# Patient Record
Sex: Male | Born: 1970 | Race: Black or African American | Hispanic: No | Marital: Single | Smoking: Former smoker
Health system: Southern US, Community
[De-identification: ages and names within clinical notes are randomized; demographics above are authoritative.]

## PROBLEM LIST (undated history)

## (undated) DIAGNOSIS — R1013 Epigastric pain: Secondary | ICD-10-CM

## (undated) DIAGNOSIS — M109 Gout, unspecified: Secondary | ICD-10-CM

## (undated) DIAGNOSIS — R51 Headache: Secondary | ICD-10-CM

## (undated) DIAGNOSIS — B019 Varicella without complication: Secondary | ICD-10-CM

## (undated) DIAGNOSIS — G43909 Migraine, unspecified, not intractable, without status migrainosus: Secondary | ICD-10-CM

## (undated) DIAGNOSIS — I10 Essential (primary) hypertension: Principal | ICD-10-CM

## (undated) HISTORY — DX: Migraine, unspecified, not intractable, without status migrainosus: G43.909

## (undated) HISTORY — DX: Essential (primary) hypertension: I10

## (undated) HISTORY — DX: Gout, unspecified: M10.9

## (undated) HISTORY — PX: WISDOM TOOTH EXTRACTION: SHX21

## (undated) HISTORY — DX: Varicella without complication: B01.9

## (undated) HISTORY — DX: Headache: R51

## (undated) SURGERY — COLONOSCOPY
Anesthesia: Moderate Sedation

---

## 2004-03-04 HISTORY — PX: HERNIA REPAIR: SHX51

## 2009-06-30 ENCOUNTER — Ambulatory Visit: Payer: Self-pay | Admitting: Internal Medicine

## 2009-06-30 ENCOUNTER — Ambulatory Visit (HOSPITAL_BASED_OUTPATIENT_CLINIC_OR_DEPARTMENT_OTHER): Admission: RE | Admit: 2009-06-30 | Discharge: 2009-06-30 | Payer: Self-pay | Admitting: Internal Medicine

## 2009-06-30 ENCOUNTER — Ambulatory Visit: Payer: Self-pay | Admitting: Diagnostic Radiology

## 2009-06-30 DIAGNOSIS — R079 Chest pain, unspecified: Secondary | ICD-10-CM

## 2009-06-30 DIAGNOSIS — R0602 Shortness of breath: Secondary | ICD-10-CM | POA: Insufficient documentation

## 2009-06-30 LAB — CONVERTED CEMR LAB
Albumin: 4.6 g/dL (ref 3.5–5.2)
BUN: 8 mg/dL (ref 6–23)
Basophils Relative: 0 % (ref 0–1)
CO2: 29 meq/L (ref 19–32)
Calcium: 9.4 mg/dL (ref 8.4–10.5)
Chloride: 101 meq/L (ref 96–112)
Cholesterol: 156 mg/dL (ref 0–200)
Creatinine, Ser: 1.12 mg/dL (ref 0.40–1.50)
Eosinophils Absolute: 0 10*3/uL (ref 0.0–0.7)
Glucose, Bld: 96 mg/dL (ref 70–99)
HCT: 43.3 % (ref 39.0–52.0)
HDL: 33 mg/dL — ABNORMAL LOW (ref >39)
Hemoglobin: 15.2 g/dL (ref 13.0–17.0)
Lymphocytes Relative: 40 % (ref 12–46)
Lymphs Abs: 2 10*3/uL (ref 0.7–4.0)
MCHC: 35.1 g/dL (ref 30.0–36.0)
Monocytes Relative: 5 % (ref 3–12)
Neutro Abs: 2.7 10*3/uL (ref 1.7–7.7)
Neutrophils Relative %: 54 % (ref 43–77)
Pro B Natriuretic peptide (BNP): 14 pg/mL (ref 0.0–100.0)
RDW: 13.5 % (ref 11.5–15.5)
TSH: 1.321 microintl units/mL (ref 0.350–4.500)
Total Bilirubin: 0.5 mg/dL (ref 0.3–1.2)
WBC: 4.9 10*3/uL (ref 4.0–10.5)

## 2009-07-02 ENCOUNTER — Telehealth: Payer: Self-pay | Admitting: Internal Medicine

## 2009-07-12 ENCOUNTER — Encounter (INDEPENDENT_AMBULATORY_CARE_PROVIDER_SITE_OTHER): Payer: Self-pay | Admitting: *Deleted

## 2009-07-13 ENCOUNTER — Telehealth: Payer: Self-pay | Admitting: Internal Medicine

## 2009-07-26 ENCOUNTER — Telehealth: Payer: Self-pay | Admitting: Internal Medicine

## 2010-04-03 NOTE — Assessment & Plan Note (Signed)
Summary: TO EST  Anthony Gill   Vital Signs:  Patient profile:   40 year old Gill Height:      67 inches Weight:      202.50 pounds BMI:     31.83 O2 Sat:      96 % on Room air Temp:     97.6 degrees F oral Pulse rate:   79 / minute BP sitting:   130 / 88  (right arm) Cuff size:   large  Vitals Entered By: Lucious Groves (June 30, 2009 9:42 AM)  O2 Flow:  Room air CC: CPX--NP--c/o breathing problems, SOB qhs that is affecting his sleep./kb Is Patient Diabetic? No Pain Assessment Patient in pain? no      Comments Patient would also like diabetes testing./kb   Primary Care Provider:  Dondra Spry DO  CC:  CPX--NP--c/o breathing problems and SOB qhs that is affecting his sleep./kb.  History of Present Illness: Anthony Gill to establish.   he c/o shortness of breath onset 3 weeks ago symptoms last approx 15 mins occ happens at day but mostly at night breathing heavy worse with exertion reports chest pains 4 to 5 times during same time period occ fluttering in the chest dry mouth at night no hx of lung dz  no hx CAD strong fam hx CAD - father (passed away age 75)  died of prostate   father had first MI at age 27   siblings have htn, and DM II also fam hx of asthma no personal hx of wheezing  Preventive Screening-Counseling & Management  Alcohol-Tobacco     Smoking Status: never      Drug Use:  no.    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  The patient denies fever, weight gain, peripheral edema, prolonged cough, abdominal pain, melena, hematochezia, severe indigestion/heartburn, and depression.    Physical Exam  General:  alert, well-developed, and well-nourished.   Head:  normocephalic and atraumatic.   Eyes:  pupils equal, pupils round, and pupils reactive to light.   Ears:  R ear normal and L ear normal.   Mouth:  pharynx pink and moist.   Neck:  No deformities, masses, or tenderness noted.no carotid bruits and no cervical lymphadenopathy.   Lungs:  normal respiratory effort, normal breath sounds, no crackles, and no wheezes.   Heart:  normal rate, regular rhythm, no murmur, and no gallop.   Abdomen:  soft, non-tender, normal bowel sounds, no masses, no hepatomegaly, and no splenomegaly.   Extremities:  trace left pedal edema and trace right pedal edema.   Neurologic:  cranial nerves II-XII intact and gait normal.   Psych:  normally interactive, good eye contact, not anxious appearing, and not depressed appearing.     Impression & Recommendations:  Problem # 1:  SHORTNESS OF BREATH (ICD-786.05) 40 y/o AA Gill with shortness of breath.  strong fam hx of CAD.  EKG normal.  CXR normal.  consider cardiomyopathy.  no sign of symptom of pulmonary dz.  arrange 2D Echo.  Check D dimer If symptoms worsen, pt will seek emergency medical care.  Orders: T-Basic Metabolic Panel [phone]) T-CBC w/Diff [phone]) T-TSH [phone]) T-BNP  (B Natriuretic Peptide) [phone]) T-Hepatic Function [id number]) T- * Misc. Laboratory test [phone]) T-2 View CXR, Same Day (71020.5TC) Echo Referral (Echo)  Problem # 2:  CHEST PAIN (ICD-786.50) Arrange further cardiac testing.    Orders: T-2 View CXR, Same Day (71020.5TC) Echo Referral (Echo) Cardiology Referral (Cardiology)  Other Orders: T-Lipid Profile ([id number])  Patient Instructions: 1)  Please schedule a follow-up appointment in 1  month. 2)  If your shortness of breath or chest pain gets worse, go to the emergency room for evaluation  Preventive Care Screening  Last Tetanus Booster:    Date:  03/05/2007    Results:  Historical    Prevention & Chronic Care Immunizations   Influenza vaccine: Not documented    Tetanus booster: 03/05/2007: Historical    Pneumococcal vaccine: Not documented  Other Screening   Smoking status: never  (06/30/2009)  Lipids   Total Cholesterol: Not documented   LDL: Not documented   LDL Direct: Not documented   HDL: Not documented   Triglycerides: Not documented

## 2010-04-03 NOTE — Progress Notes (Signed)
Summary: Lab results  Phone Note Outgoing Call   Summary of Call: call pt - electrolytes, kidney function, liver function tests normal.  no sign of anemia.  blood sugar normal.  I will discuss lab results in more detail at next OV Initial call taken by: D. Thomos Lemons DO,  Jul 02, 2009 10:59 AM  Follow-up for Phone Call        Patient and spouse notified. Follow-up by: Lucious Groves,  Jul 03, 2009 9:21 AM

## 2010-04-03 NOTE — Letter (Signed)
Summary: Primary Care Consult Scheduled Letter  [place] [facility]  [address]   [place], [place] [address]   Phone: [phone]  Fax: [phone]      07/12/2009 MRN: [id number]  EMIN FOREE [address] [place], [place]  [address]    Dear Mr. Ryner,      We have scheduled an appointment for you.  At the recommendation of DR Artist Pais , we have scheduled you a consult with DR Georges Lynch ,[place]  on MAY [phone] at _2PM .  Their address [phone] [facility] [address] ,[place] [address] . The office phone number is [phone].  If this appointment day and time is not convenient for you, please feel free to call the office of the doctor you are being referred to at the number listed above and reschedule the appointment.     It is important for you to keep your scheduled appointments. We are here to make sure you are given good patient care. If you have questions or you have made changes to your appointment, please notify us at  [phone], ask for HELEN.    Thank you,  Patient Care Coordinator [place] at [facility]

## 2010-04-03 NOTE — Progress Notes (Signed)
Summary: cardiology appts  Phone Note Call from Patient   Caller: Anthony Gill Details for Reason: Referrals Summary of Call: Pt had an Echo scheduled for  May 10th and did not show, his appt with Dr Jens Som is scheduled for  May 17th , you wanted him to have his Echo before seeing Cardilolgist,      I have left several messages for pt to return my call .  I sent Anthony Gill a letter with appt information with Dr Jens Som.  Initial call taken by: Darral Dash,  Jul 13, 2009 8:46 AM  Follow-up for Phone Call        please keep trying to contact pt Follow-up by: D. Thomos Lemons DO,  Jul 13, 2009 12:42 PM  Additional Follow-up for Phone Call Additional follow up Details #1::        Left message   for return call 07/13/2009, will keep trying to reach pt  . letter mailed with appt  information    spoke to pt , he does not understand the urgency of appointments  please call  [phone]  Additional Follow-up by: Darral Dash,  Jul 13, 2009 2:38 PM    Additional Follow-up for Phone Call Additional follow up Details #2::    Darlene,  please call pt and advise him to proceed with 2 D echo and cardiology appt as advised Follow-up by: D. Thomos Lemons DO,  Jul 17, 2009 1:20 PM  Additional Follow-up for Phone Call Additional follow up Details #3:: Details for Additional Follow-up Action Taken: Left message on machine for pt to return my call.  Mervin Kung CMA  Jul 17, 2009 2:38 PM   Pt. returned my call. Advised pt. of need to have echo and f/u with cardiologist to rule out heart condition as tests to date have been normal. Pt needs to keep appts. based on  symptoms he presented with at office visit and strong family history of heart disease. Pt. voices understanding and was transferred to [facility] to schedule appts.  Mervin Kung CMA  Jul 17, 2009 2:56 PM

## 2010-04-03 NOTE — Progress Notes (Signed)
Summary: appts set at Cardiology   Phone Note Outgoing Call   Call placed by: [facility] Cardiology Call placed to: Anthony Gill  Summary of Call: The patient has spoken with [facility] and is scheduled to have his echo on June 14th.  He is then scheduled with Dr Jens Som on June 15th.  This is what the patient himself set up with Cardiology.  If this is not soon enough please let me know and I will reschedule sooner.  Initial call taken by: Roselle Locus,  Jul 26, 2009 10:42 AM  Follow-up for Phone Call        this is ok Follow-up by: D. Thomos Lemons DO,  Jul 27, 2009 7:26 AM

## 2010-04-03 NOTE — Progress Notes (Signed)
Summary: Follow up Appt  Phone Note Call from Patient Call back at Home Phone [phone]   Caller: Patient Call For: D. Thomos Lemons DO Summary of Call: patient states he was seen last month, was informed that he was he was good, he states he [facility]ived a [facility] Referral. 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 He states whn he left the office visit he was informed that he was okay, and wanted to find out if there was something found after he  left the office. When he was contacted about the referral, patient states he inquired and was not provided information on why he was to follow up. The call dropped, and patient was called back. 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 Thomos Lemons DO,  Jul 26, 2009 10:37 AM

## 2011-06-24 ENCOUNTER — Ambulatory Visit (INDEPENDENT_AMBULATORY_CARE_PROVIDER_SITE_OTHER): Payer: BC Managed Care – PPO | Admitting: Internal Medicine

## 2011-06-24 ENCOUNTER — Encounter: Payer: Self-pay | Admitting: Internal Medicine

## 2011-06-24 VITALS — BP 112/80 | HR 85 | Temp 98.3°F | Ht 67.0 in | Wt 199.0 lb

## 2011-06-24 DIAGNOSIS — E781 Pure hyperglyceridemia: Secondary | ICD-10-CM

## 2011-06-24 DIAGNOSIS — M25429 Effusion, unspecified elbow: Secondary | ICD-10-CM

## 2011-06-24 MED ORDER — CEPHALEXIN 500 MG PO CAPS: 500.0000 mg | ORAL_CAPSULE | Freq: Three times a day (TID) | ORAL | Status: AC

## 2011-06-24 NOTE — Progress Notes (Signed)
  Subjective:    Patient ID: Anthony Gill, male    DOB: 03/28/1970, 41 y.o.   MRN: [id number]  HPI Pt presents to clinic for evaluation of insect sting. Notes visualized insect sting ?bee/wasp 6 days ago. Initially no significant sx's then over the next several days developed redness, warmth and swelling at the left elbow site of sting. No dyspnea, tongue swelling or difficulty swallowing. Taking no medication for the problem. Reviewed pictures of elbow from yesterday showing moderate st swelling. No fever, chills or drainage from elbow. Definite improvement today with decrease of swelling. No other alleviating or exacerbating factors. No other complaints. Reviewed past elevated TG level 2011. Taking no medication for cholesterol.   Past Medical History  Diagnosis Date  . History of chest pain   . History of shortness of breath    Past Surgical History  Procedure Date  . Hernia repair 2006    reports that he has never smoked. He has never used smokeless tobacco. He reports that he does not drink alcohol or use illicit drugs. family history includes Diabetes in an unspecified family member; Hypertension in an unspecified family member; and Prostate cancer in an unspecified family member. Not on File   Review of Systems see hpi    Objective:   Physical Exam  Nursing note and vitals reviewed. Constitutional: He appears well-developed and well-nourished. No distress.  HENT:  Head: Normocephalic and atraumatic.  Musculoskeletal:       Left elbow with possible small fluid collection at bursa. NT. +warmth and redness. No drainage. FROM.   Neurological: He is alert.  Skin: Skin is warm and dry. No rash noted. He is not diaphoretic. There is erythema. No pallor.  Psychiatric: He has a normal mood and affect.          Assessment & Plan:

## 2011-06-24 NOTE — Assessment & Plan Note (Signed)
Consider both inflammatory and infectious etiology. Given significant spontaneous improvement over last 24 hours more support for inflammatory. Begin aleve bid with food and no other nsaids. Given abx keflex to hold. If does not improve further over next 24 hours or worsens then begin abx immediately. Followup if no improvement or worsening.

## 2011-06-24 NOTE — Assessment & Plan Note (Signed)
Discussed follow up and recheck of fasting lipid profile. Will call to schedule when ready.

## 2011-08-30 ENCOUNTER — Ambulatory Visit (INDEPENDENT_AMBULATORY_CARE_PROVIDER_SITE_OTHER): Payer: BC Managed Care – PPO | Admitting: Internal Medicine

## 2011-08-30 ENCOUNTER — Encounter: Payer: Self-pay | Admitting: Internal Medicine

## 2011-08-30 ENCOUNTER — Ambulatory Visit (HOSPITAL_BASED_OUTPATIENT_CLINIC_OR_DEPARTMENT_OTHER)
Admission: RE | Admit: 2011-08-30 | Discharge: 2011-08-30 | Disposition: A | Discharge status: Home / Self Care | Payer: BC Managed Care – PPO | Source: Ambulatory Visit | Attending: Internal Medicine | Admitting: Internal Medicine

## 2011-08-30 VITALS — BP 110/80 | HR 83 | Temp 98.2°F | Resp 18

## 2011-08-30 DIAGNOSIS — M79609 Pain in unspecified limb: Secondary | ICD-10-CM

## 2011-08-30 DIAGNOSIS — M79673 Pain in unspecified foot: Secondary | ICD-10-CM

## 2011-08-30 LAB — URIC ACID: Uric Acid, Serum: 10.2 mg/dL — ABNORMAL HIGH (ref 4.0–7.8)

## 2011-08-30 MED ORDER — OXYCODONE-ACETAMINOPHEN 5-325 MG PO TABS: 1.0000 | ORAL_TABLET | Freq: Three times a day (TID) | ORAL | Status: DC | PRN

## 2011-08-30 MED ORDER — METHYLPREDNISOLONE 4 MG PO KIT: PACK | ORAL | Status: AC

## 2011-08-31 DIAGNOSIS — M79673 Pain in unspecified foot: Secondary | ICD-10-CM | POA: Insufficient documentation

## 2011-08-31 LAB — RHEUMATOID FACTOR: Rhuematoid fact SerPl-aCnc: <10 IU/mL (ref <=14)

## 2011-08-31 NOTE — Assessment & Plan Note (Signed)
Obtain plain radiograph of foot. Begin medrol dosepak. Percocet prn pain short term. Obtain ESR, uric acid and RF. Followup if no improvement or worsening.

## 2011-08-31 NOTE — Progress Notes (Signed)
  Subjective:    Patient ID: Anthony Gill, male    DOB: 1970-10-26, 41 y.o.   MRN: [id number]  HPI Pt presents to clinic for evaluation of foot pain. Notes 1-2 d h/o left foot pain located lateral plantar area with tenderness. Pain is worse on wt bearing. No injury/trauma. Has mild associated numbness. Denies redness, warmth or swelling. Attempted alcohol to the area without improvement. No other alleviating or exacerbating factors.  Past Medical History  Diagnosis Date  . History of chest pain   . History of shortness of breath    Past Surgical History  Procedure Date  . Hernia repair 2006    reports that he has quit smoking. He has never used smokeless tobacco. He reports that he does not drink alcohol or use illicit drugs. family history includes Diabetes in his mother; Heart disease in his paternal uncle; Hypertension in an unspecified family member; and Prostate cancer in an unspecified family member. No Known Allergies   Review of Systems see hpi     Objective:   Physical Exam  Nursing note and vitals reviewed. Constitutional: He appears well-developed and well-nourished. No distress.  HENT:  Head: Normocephalic and atraumatic.  Musculoskeletal:       Left foot- no erythema, warmth or effusion. +tenderness along plantar 5th MT head. No swelling  Neurological: He is alert.  Skin: Skin is warm and dry. No rash noted. He is not diaphoretic. No erythema.  Psychiatric: He has a normal mood and affect.          Assessment & Plan:

## 2011-09-02 ENCOUNTER — Telehealth: Payer: Self-pay | Admitting: *Deleted

## 2011-09-02 MED ORDER — COLCHICINE 0.6 MG PO TABS: 0.6000 mg | ORAL_TABLET | Freq: Two times a day (BID) | ORAL | Status: DC | PRN

## 2011-09-02 NOTE — Telephone Encounter (Signed)
Patient informed work note ready for P/U Mon-Fri 8a-5p; Rx sent to pharmacy/SLS

## 2011-09-02 NOTE — Telephone Encounter (Signed)
Notes Recorded by Edwyna Perfect, MD on 09/01/2011 at 7:51 PM Uric acid is high -strongly suggestive of gout. Steroids he is taking can help. Recommend f/u in 1-2 wks if doesn't already have appt  Spoke w/patient & patient's wife; scheduled follow-up OV for Tues, 07.09.13 @ 1:15p per wife's request. Patient [wife] requesting work note to cover period of 06.28.13 [OV] until next scheduled appointment [07.09.13]; patient's wife also faxing over [facility] paperwork for herself [caregiver] to be completed/SLS Please advise on work note dates.

## 2011-09-02 NOTE — Telephone Encounter (Signed)
If medrol doesn't improve foot pain then begin colcrys bid prn #20

## 2011-09-02 NOTE — Telephone Encounter (Signed)
Work Public house manager for MD signature/SLS

## 2011-09-02 NOTE — Telephone Encounter (Signed)
Ok for dates. Has/had severe foot pain limiting ambulation

## 2011-09-03 NOTE — Telephone Encounter (Signed)
Pt's wife stopped by the office for clarification of questions. She wanted to know if pt should take prednisone and colcrys together. Advised her per Dr Rodena Medin that pt should complete Prednisone and it is ok to take both meds together. Pt should take Colcrys twice a day as needed for foot pain and can stop taking it once pain is resolved.

## 2011-09-04 ENCOUNTER — Telehealth: Payer: Self-pay | Admitting: *Deleted

## 2011-09-04 NOTE — Telephone Encounter (Signed)
Ok to take abx. Is the foot any better yet?

## 2011-09-04 NOTE — Telephone Encounter (Signed)
Received call from pt's wife stating pt has an infected tooth and the dentist wants to start pt on an antibiotic, possibly amoxicillin. Pt's wife wants to make sure it will not interfere with any medication he is currently taking.  Please advise.

## 2011-09-04 NOTE — Telephone Encounter (Signed)
I spoke with pt's wife and gave below message, also she said the pain in foot is better.

## 2011-09-10 ENCOUNTER — Ambulatory Visit (INDEPENDENT_AMBULATORY_CARE_PROVIDER_SITE_OTHER): Payer: BC Managed Care – PPO | Admitting: Internal Medicine

## 2011-09-10 VITALS — BP 122/86 | HR 80 | Wt 199.0 lb

## 2011-09-10 DIAGNOSIS — M109 Gout, unspecified: Secondary | ICD-10-CM

## 2011-09-10 MED ORDER — OXYCODONE-ACETAMINOPHEN 5-325 MG PO TABS: 1.0000 | ORAL_TABLET | Freq: Three times a day (TID) | ORAL | Status: DC | PRN

## 2011-09-10 MED ORDER — ALLOPURINOL 300 MG PO TABS: 300.0000 mg | ORAL_TABLET | Freq: Every day | ORAL | Status: DC

## 2011-09-10 MED ORDER — METHYLPREDNISOLONE ACETATE 40 MG/ML IJ SUSP
40.0000 mg | Freq: Once | INTRAMUSCULAR | Status: AC
  Administered 2011-09-10: 40 mg via INTRAMUSCULAR

## 2011-09-10 NOTE — Progress Notes (Signed)
  Subjective:    Patient ID: Anthony Gill, male    DOB: [birth year], 41 y.o.   MRN: [id number]  HPI Pt presents to clinic for evaluation of foot pain. Reviewed elevated uric acid level. Improved with medrol dosepak. Able to ambulate now. Took only two dose of colchicine. Thinks eating peanuts made pain worse. No inflammatory changes. No prior h/o gout. No other alleviating or exacerbating factors.  Past Medical History  Diagnosis Date  . History of chest pain   . History of shortness of breath    Past Surgical History  Procedure Date  . Hernia repair 2006    reports that he has quit smoking. He has never used smokeless tobacco. He reports that he does not drink alcohol or use illicit drugs. family history includes Diabetes in his mother; Heart disease in his paternal uncle; Hypertension in an unspecified family member; and Prostate cancer in an unspecified family member. No Known Allergies   Review of Systems see hpi     Objective:   Physical Exam  Nursing note and vitals reviewed. Constitutional: He appears well-developed and well-nourished. No distress.  HENT:  Head: Normocephalic and atraumatic.  Musculoskeletal:       Left foot NT. No erythema, warm, effusion or bony abn. Gait nl  Neurological: He is alert.  Skin: Skin is warm and dry. No rash noted. He is not diaphoretic. No erythema.  Psychiatric: He has a normal mood and affect.          Assessment & Plan:

## 2011-09-15 ENCOUNTER — Encounter: Payer: Self-pay | Admitting: Internal Medicine

## 2011-09-15 DIAGNOSIS — M109 Gout, unspecified: Secondary | ICD-10-CM | POA: Insufficient documentation

## 2011-09-15 NOTE — Assessment & Plan Note (Signed)
Sx's most suggestive of gout. Improving. Given depomedrol IM injxn. Take colchicine bid prn. Given gout diet list. Followup if no improvement or worsening.

## 2011-10-08 ENCOUNTER — Ambulatory Visit: Payer: BC Managed Care – PPO | Admitting: Internal Medicine

## 2012-11-05 ENCOUNTER — Encounter (HOSPITAL_COMMUNITY): Payer: Self-pay | Admitting: Emergency Medicine

## 2012-11-05 ENCOUNTER — Emergency Department (HOSPITAL_COMMUNITY): Payer: BC Managed Care – PPO

## 2012-11-05 ENCOUNTER — Emergency Department (HOSPITAL_COMMUNITY)
Admission: EM | Admit: 2012-11-05 | Discharge: 2012-11-05 | Disposition: A | Discharge status: Home / Self Care | Payer: BC Managed Care – PPO | Attending: Emergency Medicine | Admitting: Emergency Medicine

## 2012-11-05 ENCOUNTER — Other Ambulatory Visit: Payer: Self-pay

## 2012-11-05 DIAGNOSIS — R141 Gas pain: Secondary | ICD-10-CM | POA: Insufficient documentation

## 2012-11-05 DIAGNOSIS — R109 Unspecified abdominal pain: Secondary | ICD-10-CM | POA: Insufficient documentation

## 2012-11-05 DIAGNOSIS — R112 Nausea with vomiting, unspecified: Secondary | ICD-10-CM | POA: Insufficient documentation

## 2012-11-05 DIAGNOSIS — R142 Eructation: Secondary | ICD-10-CM | POA: Insufficient documentation

## 2012-11-05 DIAGNOSIS — R42 Dizziness and giddiness: Secondary | ICD-10-CM | POA: Insufficient documentation

## 2012-11-05 DIAGNOSIS — Z87891 Personal history of nicotine dependence: Secondary | ICD-10-CM | POA: Insufficient documentation

## 2012-11-05 DIAGNOSIS — R1010 Upper abdominal pain, unspecified: Secondary | ICD-10-CM

## 2012-11-05 DIAGNOSIS — Z87898 Personal history of other specified conditions: Secondary | ICD-10-CM | POA: Insufficient documentation

## 2012-11-05 DIAGNOSIS — R6883 Chills (without fever): Secondary | ICD-10-CM | POA: Insufficient documentation

## 2012-11-05 LAB — URINALYSIS, ROUTINE W REFLEX MICROSCOPIC
Glucose, UA: NEGATIVE mg/dL
Ketones, ur: NEGATIVE mg/dL
pH: 7.5 (ref 5.0–8.0)

## 2012-11-05 LAB — BASIC METABOLIC PANEL
BUN: 10 mg/dL (ref 6–23)
CO2: 29 mEq/L (ref 19–32)
Calcium: 9.8 mg/dL (ref 8.4–10.5)
Chloride: 103 mEq/L (ref 96–112)
GFR calc Af Amer: >90 mL/min (ref >90)

## 2012-11-05 LAB — HEPATIC FUNCTION PANEL
Albumin: 4.2 g/dL (ref 3.5–5.2)
Total Bilirubin: 0.3 mg/dL (ref 0.3–1.2)
Total Protein: 7.7 g/dL (ref 6.0–8.3)

## 2012-11-05 LAB — CBC
MCH: 30.5 pg (ref 26.0–34.0)
MCHC: 36.7 g/dL — ABNORMAL HIGH (ref 30.0–36.0)
Platelets: 196 10*3/uL (ref 150–400)
RBC: 4.89 MIL/uL (ref 4.22–5.81)
WBC: 5.7 10*3/uL (ref 4.0–10.5)

## 2012-11-05 LAB — POCT I-STAT TROPONIN I: Troponin i, poc: 0.01 ng/mL (ref 0.00–0.08)

## 2012-11-05 MED ORDER — DICYCLOMINE HCL 20 MG PO TABS: 20.0000 mg | ORAL_TABLET | Freq: Four times a day (QID) | ORAL | Status: DC | PRN

## 2012-11-05 MED ORDER — ONDANSETRON HCL 4 MG/2ML IJ SOLN
4.0000 mg | Freq: Once | INTRAMUSCULAR | Status: AC
  Administered 2012-11-05: 4 mg via INTRAVENOUS
  Filled 2012-11-05: qty 2

## 2012-11-05 MED ORDER — MORPHINE SULFATE 4 MG/ML IJ SOLN
4.0000 mg | Freq: Once | INTRAMUSCULAR | Status: AC
  Administered 2012-11-05: 4 mg via INTRAVENOUS
  Filled 2012-11-05: qty 1

## 2012-11-05 MED ORDER — DICYCLOMINE HCL 10 MG/ML IM SOLN
20.0000 mg | Freq: Once | INTRAMUSCULAR | Status: AC
  Administered 2012-11-05: 20 mg via INTRAMUSCULAR
  Filled 2012-11-05: qty 2

## 2012-11-05 MED ORDER — GI COCKTAIL ~~LOC~~
30.0000 mL | Freq: Once | ORAL | Status: AC
  Administered 2012-11-05: 30 mL via ORAL
  Filled 2012-11-05: qty 30

## 2012-11-05 MED ORDER — SUCRALFATE 1 G PO TABS: 1.0000 g | ORAL_TABLET | Freq: Four times a day (QID) | ORAL | Status: DC

## 2012-11-05 MED ORDER — ONDANSETRON 4 MG PO TBDP: 4.0000 mg | ORAL_TABLET | Freq: Three times a day (TID) | ORAL | Status: DC | PRN

## 2012-11-05 MED ORDER — FAMOTIDINE IN NACL 20-0.9 MG/50ML-% IV SOLN
20.0000 mg | Freq: Once | INTRAVENOUS | Status: AC
  Administered 2012-11-05: 20 mg via INTRAVENOUS
  Filled 2012-11-05: qty 50

## 2012-11-05 MED ORDER — FAMOTIDINE 20 MG PO TABS: 20.0000 mg | ORAL_TABLET | Freq: Two times a day (BID) | ORAL | Status: DC

## 2012-11-05 MED ORDER — HYDROCODONE-ACETAMINOPHEN 5-325 MG PO TABS: 2.0000 | ORAL_TABLET | ORAL | Status: DC | PRN

## 2012-11-05 NOTE — ED Notes (Signed)
Patient transported to X-ray 

## 2012-11-05 NOTE — ED Notes (Signed)
Pt. woke up this morning with mid/left chest pain with  nausea and dizziness , denies SOB /cough or diaphoresis .

## 2012-11-05 NOTE — ED Notes (Signed)
The pt is asking for something else for pain.  Dr Norlene Campbell   Tied up with a critical pt info given to the pt and family

## 2012-11-05 NOTE — ED Provider Notes (Addendum)
CSN: [id number]     Arrival date & time 11/05/12  0507 History   First MD Initiated Contact with Patient 11/05/12 [phone]     Chief Complaint  Patient presents with  . Chest Pain   (Consider location/radiation/quality/duration/timing/severity/associated sxs/prior Treatment) HPI 42 yo male presents to the ER from home with complaint of epigastric pain radiating down and over the left side of his abdomen.  Pain is sharp, crampy.  He has had nausea and vomiting and dizziness since onset of pain.  Pain woke him from sleep around 2 am.  Pt reports similar episode on Sunday, 4 days ago that resolved on its own.  No unusual foods, no diarrhea, normal bm yesterday.  No fevers, but has had chills this morning.  No sob, diaphoresis, or chest pain.  Pain is worse when he is lying flat.  He denies prior pancreatitis, heavy alcohol use, smoking.  No prior colonoscopies.  Past Medical History  Diagnosis Date  . History of chest pain   . History of shortness of breath    Past Surgical History  Procedure Laterality Date  . Hernia repair  2006   Family History  Problem Relation Age of Onset  . Diabetes Mother   . Hypertension      family history  . Prostate cancer      family history  . Heart disease Paternal Uncle    History  Substance Use Topics  . Smoking status: Former Games developer  . Smokeless tobacco: Never Used  . Alcohol Use: No    Review of Systems  All other systems reviewed and are negative.    Allergies  Review of patient's allergies indicates no known allergies.  Home Medications  No current outpatient prescriptions on file. BP 147/37  Pulse 80  Temp(Src) 98 F (36.7 C) (Oral)  Resp 15  Ht 5\' 7"  (1.702 m)  Wt 190 lb (86.183 kg)  BMI 29.75 kg/m2  SpO2 99% Physical Exam  Nursing note and vitals reviewed. Constitutional: He is oriented to person, place, and time. He appears well-developed and well-nourished. He appears distressed.  HENT:  Head: Normocephalic and atraumatic.   Nose: Nose normal.  Mouth/Throat: Oropharynx is clear and moist.  Eyes: Conjunctivae and EOM are normal. Pupils are equal, round, and reactive to light.  Neck: Normal range of motion. Neck supple. No JVD present. No tracheal deviation present. No thyromegaly present.  Cardiovascular: Normal rate, regular rhythm, normal heart sounds and intact distal pulses.  Exam reveals no gallop and no friction rub.   No murmur heard. Pulmonary/Chest: Effort normal and breath sounds normal. No stridor. No respiratory distress. He has no wheezes. He has no rales. He exhibits no tenderness.  Abdominal: Soft. Bowel sounds are normal. He exhibits distension. He exhibits no mass. There is tenderness (ttp in epigastrium, left abdomen). There is no rebound and no guarding.  Musculoskeletal: Normal range of motion. He exhibits no edema and no tenderness.  Lymphadenopathy:    He has no cervical adenopathy.  Neurological: He is alert and oriented to person, place, and time. He exhibits normal muscle tone. Coordination normal.  Skin: Skin is warm and dry. No rash noted. No erythema. No pallor.  Psychiatric: He has a normal mood and affect. His behavior is normal. Judgment and thought content normal.    ED Course  Procedures (including critical care time) Labs Review Labs Reviewed  CBC - Abnormal; Notable for the following:    MCHC 36.7 (*)    All other components within  normal limits  BASIC METABOLIC PANEL - Abnormal; Notable for the following:    Glucose, Bld 158 (*)    GFR calc non Af Amer 89 (*)    All other components within normal limits  LIPASE, BLOOD  HEPATIC FUNCTION PANEL  URINALYSIS, ROUTINE W REFLEX MICROSCOPIC  POCT I-STAT TROPONIN I   Imaging Review Dg Chest 2 View  11/05/2012   *RADIOLOGY REPORT*  Clinical Data: Chest pain.  CHEST - 2 VIEW  Comparison: 06/30/2009.  Findings: No significant osseous abnormality.  Lungs are clear. No effusion or pneumothorax.  Cardiomediastinal size and contour  are within normal limits.  The upper abdomen is unremarkable.  IMPRESSION: No evidence of acute cardiopulmonary disease.   Original Report Authenticated By: Tiburcio Pea   Dg Abd 1 View  11/05/2012   *RADIOLOGY REPORT*  Clinical Data: Chest pain.  ABDOMEN - 1 VIEW  Comparison: None.  Findings: Nonobstructive bowel gas pattern.  No abnormal mass effect or calcification in the imaged abdomen.  No acute osseous abnormality.  Incidental right os acetabuli.  IMPRESSION: Nonobstructive bowel gas pattern.   Original Report Authenticated By: Tiburcio Pea    Date: 11/05/2012  Rate: 75  Rhythm: normal sinus rhythm  QRS Axis: normal  Intervals: normal  ST/T Wave abnormalities: normal  Conduction Disutrbances:none  Narrative Interpretation:   Old EKG Reviewed: unchanged    MDM   1. Upper abdominal pain     42 yo male with acute abd pain, n/v, recent h/o same.  Will get labs, give pain mediation, gi coctail.  Differential includes gastritis, pancreatitis, colitis.      Olivia Mackie, MD 11/05/12 1610  Olivia Mackie, MD 11/05/12 [phone]

## 2012-11-09 ENCOUNTER — Ambulatory Visit: Payer: BC Managed Care – PPO | Admitting: Physician Assistant

## 2013-03-04 DIAGNOSIS — R519 Headache, unspecified: Secondary | ICD-10-CM

## 2013-03-04 HISTORY — DX: Headache, unspecified: R51.9

## 2013-04-13 ENCOUNTER — Other Ambulatory Visit: Payer: Self-pay | Admitting: Internal Medicine

## 2013-04-13 NOTE — Telephone Encounter (Signed)
Pt has not been seen in our office since 09/2011.  Please advise re: below requests and follow up?  Medication name:  Name from pharmacy:  allopurinol (ZYLOPRIM) 300 MG tablet  ALLOPURINOL 300 MG TABLET 300 MG TAB The source prescription has been discontinued. Sig: TAKE 1 TABLET (300 MG TOTAL) BY MOUTH DAILY. Dispense: 30 tablet Refills: 6 Start: 04/13/2013 Class: Normal Requested on: 09/10/2011 Originally ordered on: 09/10/2011 Last refill: 05/22/2012 Order History and Details  Medication name:  Name from pharmacy:  COLCRYS 0.6 MG tablet  COLCRYS 0.6 MG TABLET 0.6 MG TAB The source prescription has been discontinued. Sig: TAKE 1 TABLET (0.6 MG TOTAL) BY MOUTH 2 (TWO) TIMES DAILY AS NEEDED. Dispense: 20 tablet Refills: 0 Start: 04/13/2013 Class: Normal Requested on: 09/02/2011 Originally ordered on: 09/02/2011 Last refill: 09/02/2011

## 2013-04-14 ENCOUNTER — Encounter: Payer: Self-pay | Admitting: Family Medicine

## 2013-04-14 ENCOUNTER — Ambulatory Visit (INDEPENDENT_AMBULATORY_CARE_PROVIDER_SITE_OTHER): Payer: BC Managed Care – PPO | Admitting: Family Medicine

## 2013-04-14 VITALS — BP 134/78 | HR 91 | Temp 98.8°F | Resp 14 | Ht 67.5 in | Wt 210.0 lb

## 2013-04-14 DIAGNOSIS — G43909 Migraine, unspecified, not intractable, without status migrainosus: Secondary | ICD-10-CM

## 2013-04-14 DIAGNOSIS — K219 Gastro-esophageal reflux disease without esophagitis: Secondary | ICD-10-CM

## 2013-04-14 DIAGNOSIS — M109 Gout, unspecified: Secondary | ICD-10-CM

## 2013-04-14 LAB — CBC WITH DIFFERENTIAL/PLATELET
BASOS PCT: 0.2 % (ref 0.0–3.0)
Basophils Absolute: 0 10*3/uL (ref 0.0–0.1)
EOS ABS: 0.1 10*3/uL (ref 0.0–0.7)
EOS PCT: 1.1 % (ref 0.0–5.0)
HCT: 43.3 % (ref 39.0–52.0)
Hemoglobin: 14.6 g/dL (ref 13.0–17.0)
LYMPHS PCT: 36.9 % (ref 12.0–46.0)
Lymphs Abs: 2.2 10*3/uL (ref 0.7–4.0)
MCHC: 33.7 g/dL (ref 30.0–36.0)
MCV: 89.4 fl (ref 78.0–100.0)
Monocytes Absolute: 0.4 10*3/uL (ref 0.1–1.0)
Monocytes Relative: 6.6 % (ref 3.0–12.0)
NEUTROS PCT: 55.2 % (ref 43.0–77.0)
Neutro Abs: 3.3 10*3/uL (ref 1.4–7.7)
Platelets: 195 10*3/uL (ref 150.0–400.0)
RBC: 4.85 Mil/uL (ref 4.22–5.81)
RDW: 13.7 % (ref 11.5–14.6)
WBC: 5.9 10*3/uL (ref 4.5–10.5)

## 2013-04-14 LAB — COMPREHENSIVE METABOLIC PANEL
ALBUMIN: 4.2 g/dL (ref 3.5–5.2)
ALT: 21 U/L (ref 0–53)
AST: 25 U/L (ref 0–37)
Alkaline Phosphatase: 65 U/L (ref 39–117)
BUN: 10 mg/dL (ref 6–23)
CALCIUM: 8.8 mg/dL (ref 8.4–10.5)
CHLORIDE: 104 meq/L (ref 96–112)
CO2: 24 mEq/L (ref 19–32)
Creatinine, Ser: 1 mg/dL (ref 0.4–1.5)
GFR: 107.31 mL/min (ref >60.00)
GLUCOSE: 112 mg/dL — AB (ref 70–99)
POTASSIUM: 3.6 meq/L (ref 3.5–5.1)
SODIUM: 139 meq/L (ref 135–145)
TOTAL PROTEIN: 7.5 g/dL (ref 6.0–8.3)
Total Bilirubin: 0.6 mg/dL (ref 0.3–1.2)

## 2013-04-14 LAB — URIC ACID: URIC ACID, SERUM: 7.1 mg/dL (ref 4.0–7.8)

## 2013-04-14 LAB — TSH: TSH: 0.55 u[IU]/mL (ref 0.35–5.50)

## 2013-04-14 MED ORDER — RIZATRIPTAN BENZOATE 10 MG PO TABS: 10.0000 mg | ORAL_TABLET | ORAL | Status: DC | PRN

## 2013-04-14 MED ORDER — PANTOPRAZOLE SODIUM 40 MG PO TBEC: 40.0000 mg | DELAYED_RELEASE_TABLET | Freq: Every day | ORAL | Status: DC

## 2013-04-14 NOTE — Progress Notes (Signed)
Subjective:     Patient ID: Anthony Gill, male   DOB: Mar 12, 1970, 43 y.o.   MRN: [id number]  Migraine  This is a new problem. The current episode started 1 to 4 weeks ago (Onset 1 month ago. Headaches occur 3-4 days per week, can be multiple times per day. Sometimes gets headaches in the middle of the night or first thing in the morning.). The problem occurs daily. The problem has been unchanged. The pain is located in the frontal region. The pain does not radiate. Similar to prior headaches: Pt denies having headaches in the past. He has never been evaluated for this problem before.  The quality of the pain is described as sharp (Pain is frontal and does not radiate. ). The pain is at a severity of 8/10. The pain is severe. Associated symptoms include a visual change (a bit of "twinkling lights in lateral visual fields). Pertinent negatives include no abdominal pain, anorexia, blurred vision, dizziness, eye pain, eye watering, insomnia, nausea, numbness, phonophobia, photophobia, rhinorrhea, vomiting or weakness. Associated symptoms comments: Pt denies fatigue in daytime. He is unsure about snoring at night, has been told by his wife that he breathes abnormally occasionally at bedtime. . The symptoms are aggravated by unknown (Does not drink coffee. Drinks high amounts of caffeinated sodas daily,. ). He has tried acetaminophen, darkened room and NSAIDs (Pt has tried multiple OTC meds including ibuprofen, aleve, motrin, exedrin migraine headache. No relief from above. Uses  BC powder 1-3x per day if headache persists. Sometimes his headaches return within an hour after taking BC powder. ) for the symptoms. His past medical history is significant for obesity.  Gastrophageal Reflux He complains of belching and heartburn. He reports no abdominal pain, no chest pain, no dysphagia or no nausea. burning epigastric pain. This is a new problem. The current episode started more than 1 month ago. The problem occurs  frequently. The problem has been unchanged. The heartburn is located in the substernum and LUQ. Exacerbated by: Pt denies known triggers. He reports he can eat a healthy meal , eat candy, or just drink water and he'll notice the burning.  Pertinent negatives include no fatigue. Risk factors include caffeine use, NSAIDs and obesity. He has tried an antacid ([facility] for "Chest pain" at  which point he was diagnosed with heartburn. He was given Pepcid and found relief.  He used Tums OTC  sometimes with or without relief. ) for the symptoms. No images/studies have been performed. .  Toe Pain  The incident occurred 12 to 24 hours ago (Pt was diagnosed with Gout in 2013 at which time he was prescribed Colchine and allopurinol.  Reports medication noncompliance.  He had 1 flare up about a month ago in the same toe. His episodes of gout flares in 2013 were also in the same location. ). The incident occurred at work. Pain location: right first toe. The pain is at a severity of 10/10 (Today is better than yesterday on pain scale, per pt. ). The pain has been improving since onset. Pertinent negatives include no numbness. Associated symptoms comments: Was unable to wear right shoe yesterday. . Treatments tried: Pt reports taking Colchicine yesterday x 3 and another unknown medicine x 3 for pain relief. that he has used in the past for gout.  The treatment provided significant relief.   Headache: Pt reports flashing lights in peripheral vision during his headache but not before his headache.   Review of  Systems  Constitutional: Negative for appetite change and fatigue.  HENT: Negative for rhinorrhea and trouble swallowing.   Eyes: Positive for visual disturbance. Negative for blurred vision, photophobia, pain and discharge.  Respiratory: Positive for shortness of breath.   Cardiovascular: Negative for chest pain.  Gastrointestinal: Positive for heartburn. Negative for dysphagia, nausea, vomiting,  abdominal pain and anorexia.  Musculoskeletal: Positive for arthralgias and joint swelling.  Neurological: Negative for dizziness, facial asymmetry, speech difficulty, weakness and numbness.  Psychiatric/Behavioral: The patient does not have insomnia.        Objective:   Physical Exam  Constitutional: He is oriented to person, place, and time. He appears well-developed and well-nourished. No distress.  HENT:  Head: Normocephalic and atraumatic.  Mouth/Throat: No posterior oropharyngeal edema or posterior oropharyngeal erythema.  Pasty white coating on superior surface of tongue that scrapes off with tongue depressor.   Eyes: Lids are normal. Right eye exhibits no discharge. Left eye exhibits no discharge.  Neck: Carotid bruit is not present.  Cardiovascular: Normal rate, regular rhythm and normal heart sounds.   Pulmonary/Chest: Effort normal and breath sounds normal. No respiratory distress.  Abdominal: Soft. Bowel sounds are normal. There is no tenderness.  Musculoskeletal:  1st phalanx of right foot is edematous and mildly warm to touch in comparison to left foot. Interphalangeal joint of 1st toe (right foot) is mildly tender to touch and tender with active ROM, both flxn and extn.   Lymphadenopathy:    He has no cervical adenopathy.  Neurological: He is alert and oriented to person, place, and time.  Psychiatric: He has a normal mood and affect.    Vitals: Wgt:  210 lbs             Temp: 98.8 (Oral)             O2: 94% Room Air Ht:     67.5"                 HR:    91 bpm                   BP:  134/78 mm Hg       Assessment:     1. Migraine HA syndrome most likely dx for his HAs. 2. GERD 3. Gout Flare    Plan:     Rx: 1. Rizatriptan (Maxalt) 10 mg PO when headache begins.  2. Pantoprazole 40 mg q morning before breakfast.  GERD diet handout reviewed/given to pt today. 3. Prednisone Taper, PO 40 mg daily x 3 days, 20 mg daily x 3 days, 10 mg daily x 2 days   Labs  today:  1. CBC 2. CMet 3. TSH 4. Uric Acid  Pt Education: Pt expressed understanding how to use his medicines. He was advised to refrain from using BC/Goodie powder for his headaches. He was educated about dietary modifications to avoid Gout flares in the future.  F/u: 3-4 weeks to re-evaluate Headaches, GERD, Gout Plan for future: Consider preventive treatment for Gout flares. Reduce frequency of Maxalt use if possible.    02.11.2015  [place] , PA-S.     Agree--PM

## 2013-04-15 ENCOUNTER — Encounter: Payer: Self-pay | Admitting: Family Medicine

## 2013-04-15 MED ORDER — PREDNISONE 20 MG PO TABS: ORAL_TABLET | ORAL | Status: DC

## 2013-04-15 NOTE — Telephone Encounter (Signed)
I gave him written rx's for prednisone, pantoprazole, and maxalt.  I bet I forgot to write the # to dispense on the maxalt. I'll send eRx's now so you can tell him to throw away the written rx's.  Tell him to FOLLOW THE INSTRUCTIONS ON THE BOTTLES AND THEN HE'LL KNOW EXACTLY HOW TO TAKE THEM AND WON'T HAVE TO TRY TO REMEMBER WHICH ONE TO TAKE EVERY DAY AND WHICH IS PRN, etc. -thx

## 2013-04-15 NOTE — Telephone Encounter (Signed)
Patient was not able to pick up the Rx for gout. The pharmacy did not have an Rx. He has a handwritten Rx but it doesn't have a quantity on it.  Patient is unsure which medication he is supposed to take every day & he is unsure which medications he can take as needed for his headaches. Patient states that the pharmacy said he couldn't take his medications every day. Please contact patient to clarify instructions & send gout medication to [facility] on [address].

## 2013-04-15 NOTE — Telephone Encounter (Signed)
Dr. Milinda CaveMcGowen saw him yesterday. I will forward to him.

## 2013-04-23 NOTE — Telephone Encounter (Signed)
Patient is having migraines 3 or 4 times a day. He is almost out Maxalt. Please send in refill for Maxalt. Please contact patient when Rx is sent in. Patient said if Rx can't be sent in can he take [facility]?

## 2013-04-26 ENCOUNTER — Telehealth: Payer: Self-pay | Admitting: Family Medicine

## 2013-04-26 DIAGNOSIS — R51 Headache: Principal | ICD-10-CM

## 2013-04-26 DIAGNOSIS — R519 Headache, unspecified: Secondary | ICD-10-CM

## 2013-04-26 NOTE — Telephone Encounter (Signed)
Patient also has an Rx response message asking if pt can take [facility] for headaches.  He is having headaches everyday.  Please advise.

## 2013-04-26 NOTE — Telephone Encounter (Signed)
Patient wants to know if he can take [facility] for his headaches. He has headaches every day. He is out of the Maxalt so he needs a refill.

## 2013-04-26 NOTE — Telephone Encounter (Signed)
Patient requesting maxalt refill.  Last rx was given 04/14/13 # 10.  Please advise refill.

## 2013-04-26 NOTE — Telephone Encounter (Signed)
Patient has taken all of the migraine medicine and is still having headaches, pt is requesting a refill, please advise

## 2013-04-27 MED ORDER — KETOPROFEN 75 MG PO CAPS: ORAL_CAPSULE | ORAL | Status: DC

## 2013-04-27 MED ORDER — RIZATRIPTAN BENZOATE 10 MG PO TABS: 10.0000 mg | ORAL_TABLET | ORAL | Status: DC | PRN

## 2013-04-27 MED ORDER — PROPRANOLOL HCL ER BEADS 80 MG PO CP24: ORAL_CAPSULE | ORAL | Status: DC

## 2013-04-27 NOTE — Telephone Encounter (Signed)
Talked to pt on phone. HA's still daily, maxalt not convincingly helpful going by how he describes things since I started him on this med about 2 wks ago. He has abstained from taking any OTC NSAIDs as per my request last visit. I recommended he see a neurologist and he was agreeable to this. In the meantime, I will continue maxalt but will send in rx for ketoprofen 75mg  to take as first line HA med prior to trying maxalt.  Also, will start nightly generic innopran xl 80mg  qhs.  Pt expressed understanding of plan.a

## 2013-04-28 ENCOUNTER — Encounter: Payer: Self-pay | Admitting: Neurology

## 2013-04-28 ENCOUNTER — Ambulatory Visit (INDEPENDENT_AMBULATORY_CARE_PROVIDER_SITE_OTHER): Payer: BC Managed Care – PPO | Admitting: Neurology

## 2013-04-28 VITALS — BP 140/86 | HR 80 | Temp 98.7°F | Resp 22 | Ht 67.0 in | Wt 206.4 lb

## 2013-04-28 DIAGNOSIS — R519 Headache, unspecified: Secondary | ICD-10-CM

## 2013-04-28 DIAGNOSIS — R51 Headache: Secondary | ICD-10-CM

## 2013-04-28 MED ORDER — AMITRIPTYLINE HCL 25 MG PO TABS: 25.0000 mg | ORAL_TABLET | Freq: Every day | ORAL | Status: DC

## 2013-04-28 NOTE — Patient Instructions (Addendum)
It sounds like you have chronic tension headaches. 1.  We will start amitriptyline 25mg  at bedtime.  It is an anitidepressant but commonly used for headache.  Side effects include sleepiness, dizziness, lower heart rate and lightheadedness. 2.  May take Aleve or Advil or BC powder, but no more than once or twice a week to prevent rebound headache.  Stop the rizatriptan. 3.  We will get MRI of the brain. We [facility] on 05/13/13 at 5:00 pm. Please arrive 15 minutes prior and go to 1st floor radiology. 4.  Call in 4 weeks with update.  Follow up in 3 months.

## 2013-04-28 NOTE — Progress Notes (Signed)
NEUROLOGY CONSULTATION NOTE  Anthony Gill MRN: [id number] DOB: 1971-02-05  Referring provider: Dr. Milinda Cave Primary care provider: Dr. Rodena Medin  Reason for consult:  Headache  HISTORY OF PRESENT ILLNESS: Anthony Gill is a 43 year old right-handed man with history of gout and GERD who presents for headache.  Records and images were personally reviewed where available.    Onset:  Since December 2014 Location:  Bi-frontal Quality:  Non-throbbing, clamping Intensity:  9/10 Aura:  no Associated symptoms:  Brief flickering light, but not usually.  No nausea, photophobia, phonophobia, osmophobia Duration:  Initially 1 hour but more constant lately Frequency:  Initially once a week and then almost every day since mid January Triggers/exacerbating factors:  none Relieving factors:  none  Past abortive therapy:  Aleve (ineffective), Motrin (ineffective), BC powder (was taking pain relievers daily up until 04/14/13) Past preventative therapy:  none  Current abortive therapy:  Maxalt 10mg  (ineffective), ketoprofen 75mg  (hasn't taken it) Current preventative therapy:  none  Caffeine:  soda Stress/depression:  no Sleep hygiene:  good History of migraine:  no Family history of migraine:  no.  PAST MEDICAL HISTORY: Past Medical History  Diagnosis Date  . Gout   . Migraines     PAST SURGICAL HISTORY: Past Surgical History  Procedure Laterality Date  . Hernia repair  2006    MEDICATIONS: Current Outpatient Prescriptions on File Prior to Visit  Medication Sig Dispense Refill  . allopurinol (ZYLOPRIM) 300 MG tablet TAKE 1 TABLET (300 MG TOTAL) BY MOUTH DAILY.  30 tablet  6  . COLCRYS 0.6 MG tablet TAKE 1 TABLET (0.6 MG TOTAL) BY MOUTH 2 (TWO) TIMES DAILY AS NEEDED.  20 tablet  0  . rizatriptan (MAXALT) 10 MG tablet Take 1 tablet (10 mg total) by mouth as needed for migraine. May repeat in 2 hours if needed  10 tablet  1  . propranolol (INNOPRAN XL) 80 MG 24 hr capsule 1 cap  po EVERY NIGHT at bedtime for prevention of migraine headaches  30 capsule  1   No current facility-administered medications on file prior to visit.    ALLERGIES: No Known Allergies  FAMILY HISTORY: Family History  Problem Relation Age of Onset  . Diabetes Mother   . Hypertension      family history  . Prostate cancer Father     family history  . Heart disease Paternal Uncle     SOCIAL HISTORY: History   Social History  . Marital Status: Married    Spouse Name: N/A    Number of Children: N/A  . Years of Education: N/A   Occupational History  . Not on file.   Social History Main Topics  . Smoking status: Former Smoker    Quit date: 04/04/2013  . Smokeless tobacco: Never Used  . Alcohol Use: No  . Drug Use: No  . Sexual Activity: Not on file   Other Topics Concern  . 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   Grew up in [place]   Married   1 son    REVIEW OF SYSTEMS: Constitutional: No fevers, chills, or sweats, no generalized fatigue, change in appetite Eyes: No visual changes, double vision, eye pain Ear, nose and throat: No hearing loss, ear pain, nasal congestion, sore throat Cardiovascular: No chest pain, palpitations Respiratory:  No shortness of breath at rest or with exertion, wheezes GastrointestinaI: No nausea, vomiting, diarrhea, abdominal pain, fecal  incontinence Genitourinary:  No dysuria, urinary retention or frequency Musculoskeletal:  No neck pain, back pain Integumentary: No rash, pruritus, skin lesions Neurological: as above Psychiatric: No depression, insomnia, anxiety Endocrine: No palpitations, fatigue, diaphoresis, mood swings, change in appetite, change in weight, increased thirst Hematologic/Lymphatic:  No anemia, purpura, petechiae. Allergic/Immunologic: no itchy/runny eyes, nasal congestion, recent allergic reactions, rashes  PHYSICAL EXAM: Filed Vitals:   04/28/13 1400  BP:  140/86  Pulse: 80  Temp: 98.7 F (37.1 C)  Resp: 22   General: No acute distress Head:  Normocephalic/atraumatic Neck: supple, no paraspinal tenderness, full range of motion Back: No paraspinal tenderness Heart: regular rate and rhythm Lungs: Clear to auscultation bilaterally. Vascular: No carotid bruits. Neurological Exam: Mental status: alert and oriented to person, place, and time, speech fluent and not dysarthric, language intact. Cranial nerves: CN I: not tested CN II: pupils equal, round and reactive to light, visual fields intact, fundi unremarkable. CN III, IV, VI:  full range of motion, no nystagmus, no ptosis CN V: facial sensation intact CN VII: upper and lower face symmetric CN VIII: hearing intact CN IX, X: gag intact, uvula midline CN XI: sternocleidomastoid and trapezius muscles intact CN XII: tongue midline Bulk & Tone: normal, no fasciculations. Motor: 5/5 throughout Sensation: temperature and vibration intact. Deep Tendon Reflexes: 2+ throughout, toes down Finger to nose testing: no dysmetria Heel to shin: no dysmetria Gait: normal stance and stride, able to turn, walk on toes, heels and in tandem. Romberg negative.  IMPRESSION: Chronic daily headaches, maybe complicated by medication-overuse.  Most likely tension.  PLAN: 1.  Will start amitriptyline 25mg  at bedtime.  Side effects discussed.  PCP had prescribed propranolol yesterday but patient hasn't started it yet.  We will try amitriptyline first, since I do not suspect migraine at this point. 2.  May take [facility] powder or NSAIDs but to limit to no more than once or twice a week. Stop Maxalt. 3.  Since this is a new severe constant headache, with no prior history of headache, we will get MRI of brain with and without. 4.  Call in 4 weeks with update.  Follow up in 3 months.  Thank you for allowing me to take part in the care of this patient.  Anthony MilletAdam Jaffe, DO  CC:  Charlynn Courthomas Hodgin, MD  Nicoletta BaPhilip McGowen,  MD

## 2013-05-10 ENCOUNTER — Telehealth: Payer: Self-pay | Admitting: *Deleted

## 2013-05-10 NOTE — Telephone Encounter (Signed)
Pt was Dr. Rodena MedinHodgin patient, was seen by [facility] Milinda [facility]McGowen [facility] on 02.11.15 & had medication management phone call with same physician on 02.23.15 and 02.25.15 appt with neurologist d/t referral to Neurology made by Dr. Talbot GrumblingMcGowen [for H/As]. Dr. Milinda CaveMcGowen is also listed as patient's PCP in EMR. Pt was scheduled for F/U with Dr. Milinda CaveMcGowen on Magdalene Mollyhurs, 03.12.15 at 3:45p by Sedalia Mutaiane at [facility], pt was rescheduled for same date at 2:15p for F/U with Piedad ClimesWilliam Cody Martin, PA-C at [facility], who has never seen pt before. Called pt to inquire as to if he intends to stay patient with Dr. Milinda [facility]McGowen at [facility] or if he is intending to switch PCP to [facility] with Mr. Daphine DeutscherMartin; as we have been instructed to have patient's choose PCP if they are still listed under Dr. Chase CallerHodgin [no longer with [facility]], even though pt has already been changed to Dr. Milinda CaveMcGowen, if his intention was to transfer care to Mr. Daphine DeutscherMartin at [facility], we would need to change him to a 30-minute appt, again, as he has never been seen previously by Mr. Daphine DeutscherMartin. Patient responded that he doesn't know because he "has never seen Mr. Daphine DeutscherMartin but was told on the telephone that this was his PCP???". Informed pt to come in at time scheduled on Thurs at [facility] and we would adjust our schedule to accommodate a 30-min appt time needed. Pt understood & agreed/SLS

## 2013-05-13 ENCOUNTER — Ambulatory Visit (HOSPITAL_COMMUNITY)
Admission: RE | Admit: 2013-05-13 | Discharge: 2013-05-13 | Disposition: A | Discharge status: Home / Self Care | Payer: BC Managed Care – PPO | Source: Ambulatory Visit | Attending: Neurology | Admitting: Neurology

## 2013-05-13 ENCOUNTER — Ambulatory Visit: Payer: BC Managed Care – PPO | Admitting: Physician Assistant

## 2013-05-13 DIAGNOSIS — R51 Headache: Secondary | ICD-10-CM

## 2013-05-13 MED ORDER — GADOBENATE DIMEGLUMINE 529 MG/ML IV SOLN: 20.0000 mL | Freq: Once | INTRAVENOUS | Status: DC

## 2013-05-20 ENCOUNTER — Other Ambulatory Visit: Payer: Self-pay | Admitting: *Deleted

## 2013-05-20 ENCOUNTER — Telehealth: Payer: Self-pay | Admitting: Neurology

## 2013-05-20 DIAGNOSIS — R51 Headache: Secondary | ICD-10-CM

## 2013-05-20 NOTE — Telephone Encounter (Signed)
MRI was rescheduled to 05/24/13 at [address]  Per patient request he  Has ask for a open MRI  Due to issues with breathing before going in machine  1 5 mg valium was called in for patient as well

## 2013-05-20 NOTE — Telephone Encounter (Signed)
Pt called requesting to speak to anurse regarding his MRI he had to cancel.

## 2013-05-24 ENCOUNTER — Inpatient Hospital Stay
Admission: RE | Admit: 2013-05-24 | Discharge: 2013-05-24 | Disposition: A | Discharge status: Home / Self Care | Payer: BC Managed Care – PPO | Source: Ambulatory Visit | Attending: Neurology | Admitting: Neurology

## 2013-05-26 ENCOUNTER — Encounter: Payer: BC Managed Care – PPO | Admitting: Physician Assistant

## 2013-06-09 ENCOUNTER — Encounter: Payer: Self-pay | Admitting: Physician Assistant

## 2013-06-09 ENCOUNTER — Ambulatory Visit (INDEPENDENT_AMBULATORY_CARE_PROVIDER_SITE_OTHER): Payer: BC Managed Care – PPO | Admitting: Physician Assistant

## 2013-06-09 VITALS — BP 126/88 | HR 89 | Temp 98.5°F | Resp 16 | Ht 67.0 in | Wt 206.0 lb

## 2013-06-09 DIAGNOSIS — R002 Palpitations: Secondary | ICD-10-CM | POA: Insufficient documentation

## 2013-06-09 DIAGNOSIS — M109 Gout, unspecified: Secondary | ICD-10-CM

## 2013-06-09 DIAGNOSIS — Z0000 Encounter for general adult medical examination without abnormal findings: Secondary | ICD-10-CM

## 2013-06-09 DIAGNOSIS — K219 Gastro-esophageal reflux disease without esophagitis: Secondary | ICD-10-CM

## 2013-06-09 DIAGNOSIS — E781 Pure hyperglyceridemia: Secondary | ICD-10-CM

## 2013-06-09 DIAGNOSIS — R351 Nocturia: Secondary | ICD-10-CM | POA: Insufficient documentation

## 2013-06-09 LAB — LIPID PANEL
Cholesterol: 130 mg/dL (ref 0–200)
HDL: 30 mg/dL — AB (ref >39)
LDL CALC: 42 mg/dL (ref 0–99)
TRIGLYCERIDES: 290 mg/dL — AB (ref <150)
Total CHOL/HDL Ratio: 4.3 Ratio
VLDL: 58 mg/dL — AB (ref 0–40)

## 2013-06-09 LAB — CBC
HEMATOCRIT: 41.7 % (ref 39.0–52.0)
Hemoglobin: 14.8 g/dL (ref 13.0–17.0)
MCH: 30.1 pg (ref 26.0–34.0)
MCHC: 35.5 g/dL (ref 30.0–36.0)
MCV: 84.8 fL (ref 78.0–100.0)
Platelets: 213 10*3/uL (ref 150–400)
RBC: 4.92 MIL/uL (ref 4.22–5.81)
RDW: 14.2 % (ref 11.5–15.5)
WBC: 5.2 10*3/uL (ref 4.0–10.5)

## 2013-06-09 LAB — HEPATIC FUNCTION PANEL
ALK PHOS: 66 U/L (ref 39–117)
ALT: 22 U/L (ref 0–53)
AST: 30 U/L (ref 0–37)
Albumin: 4.5 g/dL (ref 3.5–5.2)
BILIRUBIN INDIRECT: 0.5 mg/dL (ref 0.2–1.2)
Bilirubin, Direct: 0.1 mg/dL (ref 0.0–0.3)
TOTAL PROTEIN: 7.2 g/dL (ref 6.0–8.3)
Total Bilirubin: 0.6 mg/dL (ref 0.2–1.2)

## 2013-06-09 LAB — BASIC METABOLIC PANEL
BUN: 10 mg/dL (ref 6–23)
CHLORIDE: 104 meq/L (ref 96–112)
CO2: 26 meq/L (ref 19–32)
Calcium: 9.5 mg/dL (ref 8.4–10.5)
Creat: 1.07 mg/dL (ref 0.50–1.35)
Glucose, Bld: 98 mg/dL (ref 70–99)
Potassium: 3.9 mEq/L (ref 3.5–5.3)
SODIUM: 139 meq/L (ref 135–145)

## 2013-06-09 LAB — TSH: TSH: 0.918 u[IU]/mL (ref 0.350–4.500)

## 2013-06-09 NOTE — Patient Instructions (Signed)
Please obtain labs.  I will call you with your results.  You will be contacted by Cardiology for further evaluation and assessment.  Your EKG was normal.  We will start a medication for urinary symptoms once labs have resulted.  If you were to develop chest pain, severe palpitations or shortness of breath, please proceed to the ER.  Preventive Care for Adults, Male A healthy lifestyle and preventive care can promote health and wellness. Preventive health guidelines for men include the following key practices:  A routine yearly physical is a good way to check with your health care provider about your health and preventative screening. It is a chance to share any concerns and updates on your health and to receive a thorough exam.  Visit your dentist for a routine exam and preventative care every 6 months. Brush your teeth twice a day and floss once a day. Good oral hygiene prevents tooth decay and gum disease.  The frequency of eye exams is based on your age, health, family medical history, use of contact lenses, and other factors. Follow your health care provider's recommendations for frequency of eye exams.  Eat a healthy diet. Foods such as vegetables, fruits, whole grains, low-fat dairy products, and lean protein foods contain the nutrients you need without too many calories. Decrease your intake of foods high in solid fats, added sugars, and salt. Eat the right amount of calories for you.Get information about a proper diet from your health care provider, if necessary.  Regular physical exercise is one of the most important things you can do for your health. Most adults should get at least 150 minutes of moderate-intensity exercise (any activity that increases your heart rate and causes you to sweat) each week. In addition, most adults need muscle-strengthening exercises on 2 or more days a week.  Maintain a healthy weight. The body mass index (BMI) is a screening tool to identify possible weight  problems. It provides an estimate of body fat based on height and weight. Your health care provider can find your BMI and can help you achieve or maintain a healthy weight.For adults 20 years and older:  A BMI below 18.5 is considered underweight.  A BMI of 18.5 to 24.9 is normal.  A BMI of 25 to 29.9 is considered overweight.  A BMI of 30 and above is considered obese.  Maintain normal blood lipids and cholesterol levels by exercising and minimizing your intake of saturated fat. Eat a balanced diet with plenty of fruit and vegetables. Blood tests for lipids and cholesterol should begin at age 37 and be repeated every 5 years. If your lipid or cholesterol levels are high, you are over 50, or you are at high risk for heart disease, you may need your cholesterol levels checked more frequently.Ongoing high lipid and cholesterol levels should be treated with medicines if diet and exercise are not working.  If you smoke, find out from your health care provider how to quit. If you do not use tobacco, do not start.  Lung cancer screening is recommended for adults aged 31 80 years who are at high risk for developing lung cancer because of a history of smoking. A yearly low-dose CT scan of the lungs is recommended for people who have at least a 30-pack-year history of smoking and are a current smoker or have quit within the past 15 years. A pack year of smoking is smoking an average of 1 pack of cigarettes a day for 1 year (for example:  1 pack a day for 30 years or 2 packs a day for 15 years). Yearly screening should continue until the smoker has stopped smoking for at least 15 years. Yearly screening should be stopped for people who develop a health problem that would prevent them from having lung cancer treatment.  If you choose to drink alcohol, do not have more than 2 drinks per day. One drink is considered to be 12 ounces (355 mL) of beer, 5 ounces (148 mL) of wine, or 1.5 ounces (44 mL) of  liquor.  Avoid use of street drugs. Do not share needles with anyone. Ask for help if you need support or instructions about stopping the use of drugs.  High blood pressure causes heart disease and increases the risk of stroke. Your blood pressure should be checked at least every 1 2 years. Ongoing high blood pressure should be treated with medicines, if weight loss and exercise are not effective.  If you are 6 41 years old, ask your health care provider if you should take aspirin to prevent heart disease.  Diabetes screening involves taking a blood sample to check your fasting blood sugar level. This should be done once every 3 years, after age 81, if you are within normal weight and without risk factors for diabetes. Testing should be considered at a younger age or be carried out more frequently if you are overweight and have at least 1 risk factor for diabetes.  Colorectal cancer can be detected and often prevented. Most routine colorectal cancer screening begins at the age of 62 and continues through age 44. However, your health care provider may recommend screening at an earlier age if you have risk factors for colon cancer. On a yearly basis, your health care provider may provide home test kits to check for hidden blood in the stool. Use of a small camera at the end of a tube to directly examine the colon (sigmoidoscopy or colonoscopy) can detect the earliest forms of colorectal cancer. Talk to your health care provider about this at age 21, when routine screening begins. Direct exam of the colon should be repeated every 5 10 years through age 73, unless early forms of precancerous polyps or small growths are found.  People who are at an increased risk for hepatitis B should be screened for this virus. You are considered at high risk for hepatitis B if:  You were born in a country where hepatitis B occurs often. Talk with your health care provider about which countries are considered  high-risk.  Your parents were born in a high-risk country and you have not received a shot to protect against hepatitis B (hepatitis B vaccine).  You have HIV or AIDS.  You use needles to inject street drugs.  You live with, or have sex with, someone who has hepatitis B.  You are a man who has sex with other men (MSM).  You get hemodialysis treatment.  You take certain medicines for conditions such as cancer, organ transplantation, and autoimmune conditions.  Hepatitis C blood testing is recommended for all people born from [birth year] through 1965 and any individual with known risks for hepatitis C.  Practice safe sex. Use condoms and avoid high-risk sexual practices to reduce the spread of sexually transmitted infections (STIs). STIs include gonorrhea, chlamydia, syphilis, trichomonas, herpes, HPV, and human immunodeficiency virus (HIV). Herpes, HIV, and HPV are viral illnesses that have no cure. They can result in disability, cancer, and death.  A one-time screening for abdominal  aortic aneurysm (AAA) and surgical repair of large AAAs by ultrasound are recommended for men ages 51 to 3 years who are current or former smokers.  Healthy men should no longer receive prostate-specific antigen (PSA) blood tests as part of routine cancer screening. Talk with your health care provider about prostate cancer screening.  Testicular cancer screening is not recommended for adult males who have no symptoms. Screening includes self-exam, a health care provider exam, and other screening tests. Consult with your health care provider about any symptoms you have or any concerns you have about testicular cancer.  Use sunscreen. Apply sunscreen liberally and repeatedly throughout the day. You should seek shade when your shadow is shorter than you. Protect yourself by wearing long sleeves, pants, a wide-brimmed hat, and sunglasses year round, whenever you are outdoors.  Once a month, do a whole-body skin exam,  using a mirror to look at the skin on your back. Tell your health care provider about new moles, moles that have irregular borders, moles that are larger than a pencil eraser, or moles that have changed in shape or color.  Stay current with required vaccines (immunizations).  Influenza vaccine. All adults should be immunized every year.  Tetanus, diphtheria, and acellular pertussis (Td, Tdap) vaccine. An adult who has not previously received Tdap or who does not know his vaccine status should receive 1 dose of Tdap. This initial dose should be followed by tetanus and diphtheria toxoids (Td) booster doses every 10 years. Adults with an unknown or incomplete history of completing a 3-dose immunization series with Td-containing vaccines should begin or complete a primary immunization series including a Tdap dose. Adults should receive a Td booster every 10 years.  Varicella vaccine. An adult without evidence of immunity to varicella should receive 2 doses or a second dose if he has previously received 1 dose.  Human papillomavirus (HPV) vaccine. Males aged 66 21 years who have not received the vaccine previously should receive the 3-dose series. Males aged 2 26 years may be immunized. Immunization is recommended through the age of 95 years for any male who has sex with males and did not get any or all doses earlier. Immunization is recommended for any person with an immunocompromised condition through the age of 63 years if he did not get any or all doses earlier. During the 3-dose series, the second dose should be obtained 4 8 weeks after the first dose. The third dose should be obtained 24 weeks after the first dose and 16 weeks after the second dose.  Zoster vaccine. One dose is recommended for adults aged 24 years or older unless certain conditions are present.  Measles, mumps, and rubella (MMR) vaccine. Adults born before [birth year] generally are considered immune to measles and mumps. Adults born in [birth year]  or later should have 1 or more doses of MMR vaccine unless there is a contraindication to the vaccine or there is laboratory evidence of immunity to each of the three diseases. A routine second dose of MMR vaccine should be obtained at least 28 days after the first dose for students attending postsecondary schools, health care workers, or international travelers. People who received inactivated measles vaccine or an unknown type of measles vaccine during 1963 1967 should receive 2 doses of MMR vaccine. People who received inactivated mumps vaccine or an unknown type of mumps vaccine before 1979 and are at high risk for mumps infection should consider immunization with 2 doses of MMR vaccine. Unvaccinated health care workers born  before 1957 who lack laboratory evidence of measles, mumps, or rubella immunity or laboratory confirmation of disease should consider measles and mumps immunization with 2 doses of MMR vaccine or rubella immunization with 1 dose of MMR vaccine.  Pneumococcal 13-valent conjugate (PCV13) vaccine. When indicated, a person who is uncertain of his immunization history and has no record of immunization should receive the PCV13 vaccine. An adult aged 2 years or older who has certain medical conditions and has not been previously immunized should receive 1 dose of PCV13 vaccine. This PCV13 should be followed with a dose of pneumococcal polysaccharide (PPSV23) vaccine. The PPSV23 vaccine dose should be obtained at least 8 weeks after the dose of PCV13 vaccine. An adult aged 90 years or older who has certain medical conditions and previously received 1 or more doses of PPSV23 vaccine should receive 1 dose of PCV13. The PCV13 vaccine dose should be obtained 1 or more years after the last PPSV23 vaccine dose.  Pneumococcal polysaccharide (PPSV23) vaccine. When PCV13 is also indicated, PCV13 should be obtained first. All adults aged 58 years and older should be immunized. An adult younger than age  6 years who has certain medical conditions should be immunized. Any person who resides in a nursing home or long-term care facility should be immunized. An adult smoker should be immunized. People with an immunocompromised condition and certain other conditions should receive both PCV13 and PPSV23 vaccines. People with human immunodeficiency virus (HIV) infection should be immunized as soon as possible after diagnosis. Immunization during chemotherapy or radiation therapy should be avoided. Routine use of PPSV23 vaccine is not recommended for American Indians, [address], or people younger than 65 years unless there are medical conditions that require PPSV23 vaccine. When indicated, people who have unknown immunization and have no record of immunization should receive PPSV23 vaccine. One-time revaccination 5 years after the first dose of PPSV23 is recommended for people aged 53 64 years who have chronic kidney failure, nephrotic syndrome, asplenia, or immunocompromised conditions. People who received 1 2 doses of PPSV23 before age 53 years should receive another dose of PPSV23 vaccine at age 44 years or later if at least 5 years have passed since the previous dose. Doses of PPSV23 are not needed for people immunized with PPSV23 at or after age 20 years.  Meningococcal vaccine. Adults with asplenia or persistent complement component deficiencies should receive 2 doses of quadrivalent meningococcal conjugate (MenACWY-D) vaccine. The doses should be obtained at least 2 months apart. Microbiologists working with certain meningococcal bacteria, military recruits, people at risk during an outbreak, and people who travel to or live in countries with a high rate of meningitis should be immunized. A first-year college student up through age 29 years who is living in a residence hall should receive a dose if he did not receive a dose on or after his 16th birthday. Adults who have certain high-risk conditions should  receive one or more doses of vaccine.  Hepatitis A vaccine. Adults who wish to be protected from this disease, have certain high-risk conditions, work with hepatitis A-infected animals, work in hepatitis A research labs, or travel to or work in countries with a high rate of hepatitis A should be immunized. Adults who were previously unvaccinated and who anticipate close contact with an international adoptee during the first 60 days after arrival in the [place] from a country with a high rate of hepatitis A should be immunized.  Hepatitis B vaccine. Adults who wish to be protected  from this disease, have certain high-risk conditions, may be exposed to blood or other infectious body fluids, are household contacts or sex partners of hepatitis B positive people, are clients or workers in certain care facilities, or travel to or work in countries with a high rate of hepatitis B should be immunized.  Haemophilus influenzae type b (Hib) vaccine. A previously unvaccinated person with asplenia or sickle cell disease or having a scheduled splenectomy should receive 1 dose of Hib vaccine. Regardless of previous immunization, a recipient of a hematopoietic stem cell transplant should receive a 3-dose series 6 12 months after his successful transplant. Hib vaccine is not recommended for adults with HIV infection. Preventive Service / Frequency Ages 43 to 72  Blood pressure check.** / Every 1 to 2 years.  Lipid and cholesterol check.** / Every 5 years beginning at age 9.  Hepatitis C blood test.** / For any individual with known risks for hepatitis C.  Skin self-exam. / Monthly.  Influenza vaccine. / Every year.  Tetanus, diphtheria, and acellular pertussis (Tdap, Td) vaccine.** / Consult your health care provider. 1 dose of Td every 10 years.  Varicella vaccine.** / Consult your health care provider.  HPV vaccine. / 3 doses over 6 months, if 71 or younger.  Measles, mumps, rubella (MMR)  vaccine.** / You need at least 1 dose of MMR if you were born in 1957 or later. You may also need a second dose.  Pneumococcal 13-valent conjugate (PCV13) vaccine.** / Consult your health care provider.  Pneumococcal polysaccharide (PPSV23) vaccine.** / 1 to 2 doses if you smoke cigarettes or if you have certain conditions.  Meningococcal vaccine.** / 1 dose if you are age 51 to 43 years and a Market researcher living in a residence hall, or have one of several medical conditions. You may also need additional booster doses.  Hepatitis A vaccine.** / Consult your health care provider.  Hepatitis B vaccine.** / Consult your health care provider.  Haemophilus influenzae type b (Hib) vaccine.** / Consult your health care provider. Ages 69 to 64  Blood pressure check.** / Every 1 to 2 years.  Lipid and cholesterol check.** / Every 5 years beginning at age 61.  Lung cancer screening. / Every year if you are aged 84 80 years and have a 30-pack-year history of smoking and currently smoke or have quit within the past 15 years. Yearly screening is stopped once you have quit smoking for at least 15 years or develop a health problem that would prevent you from having lung cancer treatment.  Fecal occult blood test (FOBT) of stool. / Every year beginning at age 71 and continuing until age 13. You may not have to do this test if you get a colonoscopy every 10 years.  Flexible sigmoidoscopy** or colonoscopy.** / Every 5 years for a flexible sigmoidoscopy or every 10 years for a colonoscopy beginning at age 67 and continuing until age 55.  Hepatitis C blood test.** / For all people born from [birth year] through 1963 and any individual with known risks for hepatitis C.  Skin self-exam. / Monthly.  Influenza vaccine. / Every year.  Tetanus, diphtheria, and acellular pertussis (Tdap/Td) vaccine.** / Consult your health care provider. 1 dose of Td every 10 years.  Varicella vaccine.** / Consult your  health care provider.  Zoster vaccine.** / 1 dose for adults aged 49 years or older.  Measles, mumps, rubella (MMR) vaccine.** / You need at least 1 dose of MMR if you were born  in 1957 or later. You may also need a second dose.  Pneumococcal 13-valent conjugate (PCV13) vaccine.** / Consult your health care provider.  Pneumococcal polysaccharide (PPSV23) vaccine.** / 1 to 2 doses if you smoke cigarettes or if you have certain conditions.  Meningococcal vaccine.** / Consult your health care provider.  Hepatitis A vaccine.** / Consult your health care provider.  Hepatitis B vaccine.** / Consult your health care provider.  Haemophilus influenzae type b (Hib) vaccine.** / Consult your health care provider. Ages 61 and over  Blood pressure check.** / Every 1 to 2 years.  Lipid and cholesterol check.**/ Every 5 years beginning at age 87.  Lung cancer screening. / Every year if you are aged 18 80 years and have a 30-pack-year history of smoking and currently smoke or have quit within the past 15 years. Yearly screening is stopped once you have quit smoking for at least 15 years or develop a health problem that would prevent you from having lung cancer treatment.  Fecal occult blood test (FOBT) of stool. / Every year beginning at age 34 and continuing until age 57. You may not have to do this test if you get a colonoscopy every 10 years.  Flexible sigmoidoscopy** or colonoscopy.** / Every 5 years for a flexible sigmoidoscopy or every 10 years for a colonoscopy beginning at age 16 and continuing until age 17.  Hepatitis C blood test.** / For all people born from [birth year] through 1963 and any individual with known risks for hepatitis C.  Abdominal aortic aneurysm (AAA) screening.** / A one-time screening for ages 70 to 71 years who are current or former smokers.  Skin self-exam. / Monthly.  Influenza vaccine. / Every year.  Tetanus, diphtheria, and acellular pertussis (Tdap/Td) vaccine.** / 1  dose of Td every 10 years.  Varicella vaccine.** / Consult your health care provider.  Zoster vaccine.** / 1 dose for adults aged 33 years or older.  Pneumococcal 13-valent conjugate (PCV13) vaccine.** / Consult your health care provider.  Pneumococcal polysaccharide (PPSV23) vaccine.** / 1 dose for all adults aged 20 years and older.  Meningococcal vaccine.** / Consult your health care provider.  Hepatitis A vaccine.** / Consult your health care provider.  Hepatitis B vaccine.** / Consult your health care provider.  Haemophilus influenzae type b (Hib) vaccine.** / Consult your health care provider. **Family history and personal history of risk and conditions may change your health care provider's recommendations. Document Released: 04/16/2001 Document Revised: 12/09/2012 Document Reviewed: 07/16/2010 [facility] Patient Information 2014 [place], [place].

## 2013-06-09 NOTE — Assessment & Plan Note (Signed)
EKG reveals NSR.  Will check las to include TSH.  Will also refer to Cardiology for assessment and Holter Monitor.

## 2013-06-09 NOTE — Progress Notes (Signed)
Pre visit review using our clinic review tool, if applicable. No additional management support is needed unless otherwise documented below in the visit note/SLS  

## 2013-06-09 NOTE — Assessment & Plan Note (Signed)
Could not perform DRE due to patient noncompliance. Will obtain PSA level.  Will also check BMP and A1C.  Will initiate alpha-blocker if labs unremarkable.

## 2013-06-09 NOTE — Assessment & Plan Note (Signed)
History reviewed and updated.  Will obtain fasting labs.  Immunizations UTD.

## 2013-06-09 NOTE — Assessment & Plan Note (Signed)
Continue Protonix °

## 2013-06-09 NOTE — Assessment & Plan Note (Signed)
Will check fasting lipid panel.

## 2013-06-09 NOTE — Assessment & Plan Note (Signed)
No frequent symptoms.  Avoid red meats, protein bars, alcohol and tomatoes.

## 2013-06-09 NOTE — Progress Notes (Signed)
Patient presents to clinic today to establish care.  Acute Concerns: Palpitations -- patient c/o intermittent palpitations not associated with chest pain or SOB. Denies anxiety or life stressors.  Denies problems with thyroid.  BP normotensive.  Nocturia -- patient c/o nocturia x 5-6 per night.  Endorses urinary hesitancy and frequency.  Denies post-void dribbling or erectile dysfunction. Denies dysuria, hematuria, nausea/vomiting or low back pain.  Denies hx of diabetes.  Denies polyuria, polydipsia or polyphagia.    Chronic Issues: Hx Gout -- Rare flare-ups.  Limits intake of red meats, tomatoes.  Does not drink alcohol.    GERD -- well-controlled with Protonix.  Denies acid reflux. Denies epigastric pain, nausea or vomiting.    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 Has only been on regimen for a few days.  Needs MRI that has been scheduled multiple times but patient has not had performed as of yet.  Health Maintenance: Dental -- UTD Vision -- UTD Immunizations -- Does not get Flu shot.  Tetanus UTD.  Past Medical History  Diagnosis Date  . Gout   . 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 Everlena Cooper).  MRI brain ordered.  . Migraines   . Chicken pox     Past Surgical History  Procedure Laterality Date  . Hernia repair  2006  . Wisdom tooth extraction      Current Outpatient Prescriptions on File Prior to Visit  Medication Sig Dispense Refill  . amitriptyline (ELAVIL) 25 MG tablet Take 1 tablet (25 mg total) by mouth at bedtime.  30 tablet  3  . COLCRYS 0.6 MG tablet TAKE 1 TABLET (0.6 MG TOTAL) BY MOUTH 2 (TWO) TIMES DAILY AS NEEDED.  20 tablet  0   No current facility-administered medications on file prior to visit.    No Known Allergies  Family History  Problem Relation Age of Onset  . Diabetes Mother     Living  . Hypertension      family history   . Prostate cancer Father     family history  . Heart disease Paternal Uncle   . Heart disease Father   . Heart attack Father 70    Deceased  . Diabetes Sister     x3  . Cancer Other     Aunts & Uncles  . Cancer Other     Grandparents  . Heart attack Paternal Uncle   . Hyperlipidemia Mother   . Hyperlipidemia Father   . Hyperlipidemia Sister   . Diabetes Brother     x1  . Hyperlipidemia Brother     x1    History   Social History  . Marital Status: Married    Spouse Name: N/A    Number of Children: N/A  . Years of Education: N/A   Occupational History  . Not on file.   Social History Main Topics  . Smoking status: Former Smoker -- 3 years    Quit date: 04/04/2012  . Smokeless tobacco: Never Used  . Alcohol Use: Yes     Comment: social  . Drug Use: Yes     Comment: marijuana rare  . Sexual Activity: Yes    Birth Control/ Protection: None     Comment: married   Other Topics Concern  . 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   Grew up in [place]   Married  1 son   Review of Systems  Constitutional: Negative for fever and weight loss.  HENT: Negative for ear discharge, ear pain, hearing loss and tinnitus.   Eyes: Positive for blurred vision. Negative for double vision and photophobia.       Has seen optometrist  Respiratory: Negative for shortness of breath.   Cardiovascular: Positive for palpitations. Negative for chest pain.  Gastrointestinal: Positive for heartburn. Negative for nausea, vomiting, abdominal pain, diarrhea, constipation, blood in stool and melena.  Genitourinary: Negative for dysuria, urgency, frequency, hematuria and flank pain.       Nocturia x 6-7 times per night.  Neurological: Positive for headaches. Negative for dizziness and loss of consciousness.  Psychiatric/Behavioral: Negative for depression, suicidal ideas, hallucinations and substance abuse. The patient is not  nervous/anxious.    BP 126/88  Pulse 89  Temp(Src) 98.5 F (36.9 C) (Oral)  Resp 16  Ht 5\' 7"  (1.702 m)  Wt 206 lb (93.441 kg)  BMI 32.26 kg/m2  SpO2 97%  Physical Exam  Vitals reviewed. Constitutional: He is oriented to person, place, and time and well-developed, well-nourished, and in no distress.  HENT:  Head: Normocephalic and atraumatic.  Right Ear: External ear normal.  Left Ear: External ear normal.  Nose: Nose normal.  Mouth/Throat: Oropharynx is clear and moist. No oropharyngeal exudate.  TM within normal limits bilaterally.  Eyes: Conjunctivae and EOM are normal. Pupils are equal, round, and reactive to light.  Neck: Neck supple. No thyromegaly present.  Cardiovascular: Normal rate, regular rhythm, normal heart sounds and intact distal pulses.   No murmur heard. Pulmonary/Chest: Effort normal and breath sounds normal. No respiratory distress. He has no wheezes. He has no rales. He exhibits no tenderness.  Abdominal: Soft. Bowel sounds are normal. He exhibits no distension and no mass. There is no tenderness. There is no rebound and no guarding.  Lymphadenopathy:    He has no cervical adenopathy.  Neurological: He is alert and oriented to person, place, and time.  Skin: Skin is warm and dry. No rash noted.  Psychiatric: Affect normal.    Recent Results (from the past 2160 hour(s))  CBC WITH DIFFERENTIAL     Status: None   Collection Time    04/14/13 10:26 AM      Result Value Ref Range   WBC 5.9  4.5 - 10.5 K/uL   RBC 4.85  4.22 - 5.81 Mil/uL   Hemoglobin 14.6  13.0 - 17.0 g/dL   HCT 40.9  81.1 - 91.4 %   MCV 89.4  78.0 - 100.0 fl   MCHC 33.7  30.0 - 36.0 g/dL   RDW 78.2  95.6 - 21.3 %   Platelets 195.0  150.0 - 400.0 K/uL   Neutrophils Relative % 55.2  43.0 - 77.0 %   Lymphocytes Relative 36.9  12.0 - 46.0 %   Monocytes Relative 6.6  3.0 - 12.0 %   Eosinophils Relative 1.1  0.0 - 5.0 %   Basophils Relative 0.2  0.0 - 3.0 %   Neutro Abs 3.3  1.4 - 7.7  K/uL   Lymphs Abs 2.2  0.7 - 4.0 K/uL   Monocytes Absolute 0.4  0.1 - 1.0 K/uL   Eosinophils Absolute 0.1  0.0 - 0.7 K/uL   Basophils Absolute 0.0  0.0 - 0.1 K/uL  COMPREHENSIVE METABOLIC PANEL     Status: Abnormal   Collection Time    04/14/13 10:26 AM      Result Value Ref Range  Sodium 139  135 - 145 mEq/L   Potassium 3.6  3.5 - 5.1 mEq/L   Chloride 104  96 - 112 mEq/L   CO2 24  19 - 32 mEq/L   Glucose, Bld 112 (*) 70 - 99 mg/dL   BUN 10  6 - 23 mg/dL   Creatinine, Ser 1.0  0.4 - 1.5 mg/dL   Total Bilirubin 0.6  0.3 - 1.2 mg/dL   Alkaline Phosphatase 65  39 - 117 U/L   AST 25  0 - 37 U/L   ALT 21  0 - 53 U/L   Total Protein 7.5  6.0 - 8.3 g/dL   Albumin 4.2  3.5 - 5.2 g/dL   Calcium 8.8  8.4 - 16.1 mg/dL   GFR 096.04  >54.09 mL/min  TSH     Status: None   Collection Time    04/14/13 10:26 AM      Result Value Ref Range   TSH 0.55  0.35 - 5.50 uIU/mL  URIC ACID     Status: None   Collection Time    04/14/13 10:26 AM      Result Value Ref Range   Uric Acid, Serum 7.1  4.0 - 7.8 mg/dL  CBC     Status: None   Collection Time    06/09/13  4:00 AM      Result Value Ref Range   WBC 5.2  4.0 - 10.5 K/uL   RBC 4.92  4.22 - 5.81 MIL/uL   Hemoglobin 14.8  13.0 - 17.0 g/dL   HCT 81.1  91.4 - 78.2 %   MCV 84.8  78.0 - 100.0 fL   MCH 30.1  26.0 - 34.0 pg   MCHC 35.5  30.0 - 36.0 g/dL   RDW 95.6  21.3 - 08.6 %   Platelets 213  150 - 400 K/uL  BASIC METABOLIC PANEL     Status: None   Collection Time    06/09/13  4:00 AM      Result Value Ref Range   Sodium    135 - 145 mEq/L   Potassium    3.5 - 5.3 mEq/L   Chloride    96 - 112 mEq/L   CO2    19 - 32 mEq/L   Glucose, Bld    70 - 99 mg/dL   BUN    6 - 23 mg/dL   Creat    5.78 - 4.69 mg/dL   Calcium    8.4 - 62.9 mg/dL  HEPATIC FUNCTION PANEL     Status: None   Collection Time    06/09/13  4:00 AM      Result Value Ref Range   Total Bilirubin    0.2 - 1.2 mg/dL   Bilirubin, Direct    0.0 - 0.3 mg/dL   Indirect  Bilirubin    0.2 - 1.2 mg/dL   Alkaline Phosphatase    39 - 117 U/L   AST    0 - 37 U/L   ALT    0 - 53 U/L   Total Protein    6.0 - 8.3 g/dL   Albumin    3.5 - 5.2 g/dL  TSH     Status: None   Collection Time    06/09/13  4:00 AM      Result Value Ref Range   TSH    0.350 - 4.500 uIU/mL  HEMOGLOBIN A1C     Status: None   Collection Time  06/09/13  4:00 AM      Result Value Ref Range   Hemoglobin A1C    <5.7 %   Mean Plasma Glucose    <117 mg/dL  URINALYSIS, ROUTINE W REFLEX MICROSCOPIC     Status: None   Collection Time    06/09/13  4:00 AM      Result Value Ref Range   Color, Urine    YELLOW   APPearance    CLEAR   Specific Gravity, Urine    1.005 - 1.030   pH    5.0 - 8.0   Glucose, UA    NEG mg/dL   Bilirubin Urine    NEG   Ketones, ur    NEG mg/dL   Hgb urine dipstick    NEG   Protein, ur    NEG mg/dL   Urobilinogen, UA    0.0 - 1.0 mg/dL   Nitrite    NEG   Leukocytes, UA    NEG  LIPID PANEL     Status: None   Collection Time    06/09/13  4:00 AM      Result Value Ref Range   Cholesterol    0 - 200 mg/dL   Triglycerides    <161<150 mg/dL   HDL    >09>39 mg/dL   Total CHOL/HDL Ratio       VLDL    0 - 40 mg/dL   LDL Cholesterol    0 - 99 mg/dL  PSA, TOTAL AND FREE     Status: None   Collection Time    06/09/13  4:00 AM      Result Value Ref Range   PSA    <=4.00 ng/mL   PSA, Free       PSA, Free Pct    > 25 %    Assessment/Plan: Hypertriglyceridemia Will check fasting lipid panel.  Gout No frequent symptoms.  Avoid red meats, protein bars, alcohol and tomatoes.  Fluttering sensation of heart EKG reveals NSR.  Will check las to include TSH.  Will also refer to Cardiology for assessment and Holter Monitor.  Nocturia Could not perform DRE due to patient noncompliance. Will obtain PSA level.  Will also check BMP and A1C.  Will initiate alpha-blocker if labs unremarkable.  Visit for preventive health examination History reviewed and updated.  Will obtain  fasting labs.  Immunizations UTD.  GERD (gastroesophageal reflux disease) Continue Protonix

## 2013-06-10 LAB — URINALYSIS, ROUTINE W REFLEX MICROSCOPIC
BILIRUBIN URINE: NEGATIVE
GLUCOSE, UA: NEGATIVE mg/dL
Hgb urine dipstick: NEGATIVE
Leukocytes, UA: NEGATIVE
NITRITE: NEGATIVE
PH: 5.5 (ref 5.0–8.0)
Protein, ur: NEGATIVE mg/dL
SPECIFIC GRAVITY, URINE: 1.028 (ref 1.005–1.030)
Urobilinogen, UA: 0.2 mg/dL (ref 0.0–1.0)

## 2013-06-10 LAB — PSA, TOTAL AND FREE
PSA, Free Pct: 24 % (ref >25)
PSA, Free: 0.19 ng/mL
PSA: 0.78 ng/mL (ref <=4.00)

## 2013-06-10 LAB — HEMOGLOBIN A1C
Hgb A1c MFr Bld: 6.1 % — ABNORMAL HIGH (ref <5.7)
Mean Plasma Glucose: 128 mg/dL — ABNORMAL HIGH (ref <117)

## 2013-06-11 ENCOUNTER — Telehealth: Payer: Self-pay | Admitting: *Deleted

## 2013-06-11 NOTE — Telephone Encounter (Signed)
LMOM with contact name and number for return call RE: MRI and need for openness d/t claustrophobia and further instructions that this will affect which facility he needs to have the Imaging order placed to [facility] standard "open MRI"; [facility] "completely open MRI"]/SLS

## 2013-07-14 ENCOUNTER — Encounter: Payer: BC Managed Care – PPO | Admitting: Cardiology

## 2013-07-14 NOTE — Progress Notes (Signed)
     HPI: 43 year old male for evaluation of palpitations. Laboratories in April of 2015 showed a normal hemoglobin, normal potassium and normal TSH.   Current Outpatient Prescriptions  Medication Sig Dispense Refill  . amitriptyline (ELAVIL) 25 MG tablet Take 1 tablet (25 mg total) by mouth at bedtime.  30 tablet  3  . COLCRYS 0.6 MG tablet TAKE 1 TABLET (0.6 MG TOTAL) BY MOUTH 2 (TWO) TIMES DAILY AS NEEDED.  20 tablet  0   No current facility-administered medications for this visit.    No Known Allergies  Past Medical History  Diagnosis Date  . Gout   . 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 Everlena CooperJaffe).  MRI brain ordered.  . Migraines   . Chicken pox     Past Surgical History  Procedure Laterality Date  . Hernia repair  2006  . Wisdom tooth extraction      History   Social History  . Marital Status: Married    Spouse Name: N/A    Number of Children: N/A  . Years of Education: N/A   Occupational History  . Not on file.   Social History Main Topics  . Smoking status: Former Smoker -- 3 years    Quit date: 04/04/2012  . Smokeless tobacco: Never Used  . Alcohol Use: Yes     Comment: social  . Drug Use: Yes     Comment: marijuana rare  . Sexual Activity: Yes    Birth Control/ Protection: None     Comment: married   Other Topics Concern  . 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   [place] up in [place]   Married   1 son    Family History  Problem Relation Age of Onset  . Diabetes Mother     Living  . Hypertension      family history  . Prostate cancer Father     family history  . Heart disease Paternal Uncle   . Heart disease Father   . Heart attack Father 1280    Deceased  . Diabetes Sister     x3  . Cancer Other     Aunts & Uncles  . Cancer Other     Grandparents  . Heart attack Paternal Uncle   . Hyperlipidemia Mother   . Hyperlipidemia  Father   . Hyperlipidemia Sister   . Diabetes Brother     x1  . Hyperlipidemia Brother     x1    ROS: no fevers or chills, productive cough, hemoptysis, dysphasia, odynophagia, melena, hematochezia, dysuria, hematuria, rash, seizure activity, orthopnea, PND, pedal edema, claudication. Remaining systems are negative.  Physical Exam:   There were no vitals taken for this visit.  General:  Well developed/well nourished in NAD Skin warm/dry Patient not depressed No peripheral clubbing Back-normal HEENT-normal/normal eyelids Neck supple/normal carotid upstroke bilaterally; no bruits; no JVD; no thyromegaly chest - CTA/ normal expansion CV - RRR/normal S1 and S2; no murmurs, rubs or gallops;  PMI nondisplaced Abdomen -NT/ND, no HSM, no mass, + bowel sounds, no bruit 2+ femoral pulses, no bruits Ext-no edema, chords, 2+ DP Neuro-grossly nonfocal  ECG 06/09/2013-sinus rhythm with no ST changes.   This encounter was created in error - please disregard.

## 2013-07-27 ENCOUNTER — Ambulatory Visit: Payer: BC Managed Care – PPO | Admitting: Neurology

## 2013-07-28 ENCOUNTER — Telehealth: Payer: Self-pay | Admitting: Neurology

## 2013-07-28 NOTE — Telephone Encounter (Signed)
Pt no showed 07/27/13 appt w/ Dr. Everlena Cooper. No show letter mailed to pt / Sherri S.

## 2013-08-23 ENCOUNTER — Telehealth: Payer: Self-pay | Admitting: Family Medicine

## 2013-08-23 ENCOUNTER — Other Ambulatory Visit: Payer: Self-pay | Admitting: Physician Assistant

## 2013-08-23 NOTE — Telephone Encounter (Signed)
Not part of patient's active med list.  He will need appointment to discuss symptoms before I will refill it.

## 2013-08-23 NOTE — Telephone Encounter (Signed)
Pharmacy fax request sent to Dr. Milinda CaveMcGowen for innopran XL 80 mg caps.  Sig is one QHS for migraine prevention.  Patient's PCP is Malva Coganody Martin.  Please advise.

## 2013-08-24 ENCOUNTER — Telehealth: Payer: Self-pay | Admitting: Family Medicine

## 2013-08-24 NOTE — Telephone Encounter (Signed)
Follow previous phone note.   Patient called back stating that he didn't understand why he could get the innopran refilled because he told Selena BattenCody that he was on this medication at his last visit.  I went over his two current medications per his chart.  Patient said so anytime I need a Rx refill I have to come in to be seen.  I again tried to explain to him that if his PCP didn't Rx the medication or it isn't on the current med list he will need to come in to let PCP know he is taking med and why.  Patient then stated "yall are so crazy, he ain't got time for this".

## 2013-08-24 NOTE — Telephone Encounter (Signed)
Patient aware.  He declined appointment at this time.

## 2014-03-07 ENCOUNTER — Ambulatory Visit: Payer: Self-pay | Admitting: Physician Assistant

## 2014-03-08 ENCOUNTER — Encounter: Payer: Self-pay | Admitting: Physician Assistant

## 2014-03-08 ENCOUNTER — Ambulatory Visit (INDEPENDENT_AMBULATORY_CARE_PROVIDER_SITE_OTHER): Payer: BLUE CROSS/BLUE SHIELD | Admitting: Physician Assistant

## 2014-03-08 VITALS — BP 127/74 | HR 88 | Temp 98.5°F | Resp 16 | Ht 67.0 in | Wt 201.2 lb

## 2014-03-08 DIAGNOSIS — A048 Other specified bacterial intestinal infections: Secondary | ICD-10-CM

## 2014-03-08 DIAGNOSIS — R198 Other specified symptoms and signs involving the digestive system and abdomen: Secondary | ICD-10-CM

## 2014-03-08 DIAGNOSIS — B9681 Helicobacter pylori [H. pylori] as the cause of diseases classified elsewhere: Secondary | ICD-10-CM

## 2014-03-08 LAB — COMPREHENSIVE METABOLIC PANEL
ALT: 22 U/L (ref 0–53)
AST: 21 U/L (ref 0–37)
Albumin: 4.2 g/dL (ref 3.5–5.2)
Alkaline Phosphatase: 68 U/L (ref 39–117)
BILIRUBIN TOTAL: 0.4 mg/dL (ref 0.2–1.2)
BUN: 10 mg/dL (ref 6–23)
CHLORIDE: 102 meq/L (ref 96–112)
CO2: 30 meq/L (ref 19–32)
CREATININE: 1.2 mg/dL (ref 0.4–1.5)
Calcium: 9.1 mg/dL (ref 8.4–10.5)
GFR: 83.95 mL/min (ref >60.00)
Glucose, Bld: 111 mg/dL — ABNORMAL HIGH (ref 70–99)
Potassium: 3.6 mEq/L (ref 3.5–5.1)
SODIUM: 137 meq/L (ref 135–145)
TOTAL PROTEIN: 7.4 g/dL (ref 6.0–8.3)

## 2014-03-08 LAB — LIPASE: Lipase: 27 U/L (ref 11.0–59.0)

## 2014-03-08 LAB — CBC
HEMATOCRIT: 44.5 % (ref 39.0–52.0)
HEMOGLOBIN: 15.2 g/dL (ref 13.0–17.0)
MCHC: 34.3 g/dL (ref 30.0–36.0)
MCV: 87.5 fl (ref 78.0–100.0)
Platelets: 193 10*3/uL (ref 150.0–400.0)
RBC: 5.08 Mil/uL (ref 4.22–5.81)
RDW: 13.8 % (ref 11.5–15.5)
WBC: 6.5 10*3/uL (ref 4.0–10.5)

## 2014-03-08 LAB — H. PYLORI ANTIBODY, IGG: H Pylori IgG: POSITIVE — AB

## 2014-03-08 MED ORDER — SUCRALFATE 1 G PO TABS: 1.0000 g | ORAL_TABLET | Freq: Three times a day (TID) | ORAL | Status: DC

## 2014-03-08 MED ORDER — OMEPRAZOLE 20 MG PO CPDR: 20.0000 mg | DELAYED_RELEASE_CAPSULE | Freq: Two times a day (BID) | ORAL | Status: DC

## 2014-03-08 NOTE — Progress Notes (Signed)
Pre visit review using our clinic review tool, if applicable. No additional management support is needed unless otherwise documented below in the visit note/SLS  

## 2014-03-08 NOTE — Patient Instructions (Signed)
Please stop by the lab for blood work.  I will call you with your results.  Be sure to mail back in the stool kit.   Start taking the Prilosec prescription twice daily as directed.  Use the carafate as directed.  Stay well hydrated.  Avoid Ibuprofen, Goody or BC powders.  Avoid alcohol consumption.  Avoid spicy foods.  Follow-up with me in 2 weeks.  Food Choices for Peptic Ulcer Disease When you have peptic ulcer disease, the foods you eat and your eating habits are very important. Choosing the right foods can help ease the discomfort of peptic ulcer disease. WHAT GENERAL GUIDELINES DO I NEED TO FOLLOW?  Choose fruits, vegetables, whole grains, and low-fat meat, fish, and poultry.   Keep a food diary to identify foods that cause symptoms.  Avoid foods that cause irritation or pain. These may be different for different people.  Eat frequent small meals instead of three large meals each day. The pain may be worse when your stomach is empty.  Avoid eating close to bedtime. WHAT FOODS ARE NOT RECOMMENDED? The following are some foods and drinks that may worsen your symptoms:  Black, white, and red pepper.  Hot sauce.  Chili peppers.  Chili powder.  Chocolate and cocoa.   Alcohol.  Tea, coffee, and cola (regular and decaffeinated). The items listed above may not be a complete list of foods and beverages to avoid. Contact your dietitian for more information. Document Released: 05/13/2011 Document Revised: 02/23/2013 Document Reviewed: 12/23/2012 [facility] Patient Information 2015 [place], [place]. This information is not intended to replace advice given to you by your health care provider. Make sure you discuss any questions you have with your health care provider.

## 2014-03-08 NOTE — Progress Notes (Signed)
Patient presents to clinic today c/o continued heart burn now with epigastric pain with eating.  Endorses recent use of BC powders.  Denies alcohol consumption at present.  Has not been taking his Prilosec as directed.  Patient denies melena or hematochezia.  Denies hx of PUD or anemia.  Past Medical History  Diagnosis Date  . Gout   . 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 Everlena Cooper).  MRI brain ordered.  . Migraines   . Chicken pox     Current Outpatient Prescriptions on File Prior to Visit  Medication Sig Dispense Refill  . amitriptyline (ELAVIL) 25 MG tablet Take 1 tablet (25 mg total) by mouth at bedtime. 30 tablet 3  . COLCRYS 0.6 MG tablet TAKE 1 TABLET (0.6 MG TOTAL) BY MOUTH 2 (TWO) TIMES DAILY AS NEEDED. 20 tablet 0   No current facility-administered medications on file prior to visit.    No Known Allergies  Family History  Problem Relation Age of Onset  . Diabetes Mother     Living  . Hypertension      family history  . Prostate cancer Father     family history  . Heart disease Paternal Uncle   . Heart disease Father   . Heart attack Father 53    Deceased  . Diabetes Sister     x3  . Cancer Other     Aunts & Uncles  . Cancer Other     Grandparents  . Heart attack Paternal Uncle   . Hyperlipidemia Mother   . Hyperlipidemia Father   . Hyperlipidemia Sister   . Diabetes Brother     x1  . Hyperlipidemia Brother     x1    History   Social History  . Marital Status: Married    Spouse Name: N/A    Number of Children: N/A  . Years of Education: N/A   Social History Main Topics  . Smoking status: Former Smoker -- 3 years    Quit date: 04/04/2012  . Smokeless tobacco: Never Used  . Alcohol Use: Yes     Comment: social  . Drug Use: Yes     Comment: marijuana rare  . Sexual Activity: Yes    Birth Control/ Protection: None     Comment: married   Other Topics Concern  . 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   Grew up in [place]   Married   1 son   Review of Systems - See HPI.  All other ROS are negative.  BP 127/74 mmHg  Pulse 88  Temp(Src) 98.5 F (36.9 C) (Oral)  Resp 16  Ht  (1.702 m)  Wt 201 lb 4 oz (91.286 kg)  BMI 31.51 kg/m2  SpO2 99%  Physical Exam  Constitutional: He is oriented to person, place, and time and well-developed, well-nourished, and in no distress.  HENT:  Head: Normocephalic and atraumatic.  Eyes: Conjunctivae are normal. Pupils are equal, round, and reactive to light.  Neck: Neck supple.  Cardiovascular: Normal rate, regular rhythm, normal heart sounds and intact distal pulses.   Pulmonary/Chest: Effort normal and breath sounds normal. No respiratory distress. He has no wheezes. He has no rales. He exhibits no tenderness.  Abdominal: Soft. Bowel sounds are normal. He exhibits no distension and no mass. There is no hepatosplenomegaly. There is tenderness in the epigastric area. There is no rebound,  no guarding and no CVA tenderness. No hernia.  Lymphadenopathy:    He has no cervical adenopathy.  Neurological: He is alert and oriented to person, place, and time.  Skin: Skin is warm and dry. No rash noted.  Psychiatric: Affect normal.  Vitals reviewed.  Assessment/Plan: Peptic ulcer symptoms Symptoms consistent with ulcer and GERD.  Concern for H. Pylori infection.  Labs ordered -- CBC, CMP, Lipase, IFOB and H. Pylori IgG. Labs good overall but H. Pylori IgG is positive. IFOB pending.  Rx Triple Therapy -- Prilosec 20 mg BID, Biaxin 500 mg BID x 14 days and Flagyl 500 mg BID x 14 days. Carafate given to coat and ease pain.  Avoid NSAIDs or alcohol.  Follow-up in 2 weeks.

## 2014-03-09 ENCOUNTER — Telehealth: Payer: Self-pay | Admitting: Physician Assistant

## 2014-03-09 DIAGNOSIS — K279 Peptic ulcer, site unspecified, unspecified as acute or chronic, without hemorrhage or perforation: Principal | ICD-10-CM

## 2014-03-09 DIAGNOSIS — B9681 Helicobacter pylori [H. pylori] as the cause of diseases classified elsewhere: Secondary | ICD-10-CM

## 2014-03-09 MED ORDER — METRONIDAZOLE 500 MG PO TABS: 500.0000 mg | ORAL_TABLET | Freq: Two times a day (BID) | ORAL | Status: DC

## 2014-03-09 MED ORDER — CLARITHROMYCIN 500 MG PO TABS: 500.0000 mg | ORAL_TABLET | Freq: Two times a day (BID) | ORAL | Status: DC

## 2014-03-09 NOTE — Telephone Encounter (Signed)
Labs reveal + H. Pylori as cause of his pain and likely ulcer.  Continue the Prilosec given yesterday twice daily.  We will start an antibiotic regimen to kill the infection and relieve his symptoms.  I have sent in Biaxin 500 mg to take twice daily and Flagyl 500 mg to take twice daily.  Both medications will be for a total of 14 days.  He may feel a little nauseated the first couple of days of medication regimen, but then should begin to notice good relief of symptoms.  Also do not use Colcrys (gout medication) or alcohol while on these antibiotics.  I want him to follow-up with me in 2 weeks.

## 2014-03-09 NOTE — Telephone Encounter (Signed)
Patient informed, understood & agreed/SLS  

## 2014-03-12 DIAGNOSIS — R198 Other specified symptoms and signs involving the digestive system and abdomen: Secondary | ICD-10-CM | POA: Insufficient documentation

## 2014-03-12 DIAGNOSIS — A048 Other specified bacterial intestinal infections: Secondary | ICD-10-CM | POA: Insufficient documentation

## 2014-03-12 NOTE — Assessment & Plan Note (Signed)
Symptoms consistent with ulcer and GERD.  Concern for H. Pylori infection.  Labs ordered -- CBC, CMP, Lipase, IFOB and H. Pylori IgG. Labs good overall but H. Pylori IgG is positive. IFOB pending.  Rx Triple Therapy -- Prilosec 20 mg BID, Biaxin 500 mg BID x 14 days and Flagyl 500 mg BID x 14 days. Carafate given to coat and ease pain.  Avoid NSAIDs or alcohol.  Follow-up in 2 weeks.

## 2014-03-14 ENCOUNTER — Ambulatory Visit (INDEPENDENT_AMBULATORY_CARE_PROVIDER_SITE_OTHER): Payer: BLUE CROSS/BLUE SHIELD | Admitting: Physician Assistant

## 2014-03-14 ENCOUNTER — Encounter: Payer: Self-pay | Admitting: Physician Assistant

## 2014-03-14 VITALS — BP 131/91 | HR 77 | Temp 98.5°F | Resp 16 | Ht 67.0 in | Wt 197.2 lb

## 2014-03-14 DIAGNOSIS — K279 Peptic ulcer, site unspecified, unspecified as acute or chronic, without hemorrhage or perforation: Secondary | ICD-10-CM

## 2014-03-14 HISTORY — DX: Peptic ulcer, site unspecified, unspecified as acute or chronic, without hemorrhage or perforation: K27.9

## 2014-03-14 MED ORDER — TRAMADOL HCL 50 MG PO TABS
50.0000 mg | ORAL_TABLET | Freq: Three times a day (TID) | ORAL | Status: DC | PRN
Start: 2014-03-14 — End: 2014-04-12

## 2014-03-14 NOTE — Progress Notes (Signed)
Patient presents to clinic today c/o continued abdominal pain despite initiating treatment for H. Pylori Ulcer.  Endorses some mild nausea with use of Carafate.  Is taking antibiotics as directed.  States the pain after eating has remained moderate in nature.  Still denies melena or hematochezia.  Denies fever, chills or malaise.  Has still been heating very heavy and fried foods, along with sodas.  Past Medical History  Diagnosis Date  . Gout   . 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 Tomi Likens).  MRI brain ordered.  . Migraines   . Chicken pox     Current Outpatient Prescriptions on File Prior to Visit  Medication Sig Dispense Refill  . clarithromycin (BIAXIN) 500 MG tablet Take 1 tablet (500 mg total) by mouth 2 (two) times daily. 28 tablet 0  . COLCRYS 0.6 MG tablet TAKE 1 TABLET (0.6 MG TOTAL) BY MOUTH 2 (TWO) TIMES DAILY AS NEEDED. 20 tablet 0  . metroNIDAZOLE (FLAGYL) 500 MG tablet Take 1 tablet (500 mg total) by mouth 2 (two) times daily. 28 tablet 0  . omeprazole (PRILOSEC) 20 MG capsule Take 1 capsule (20 mg total) by mouth 2 (two) times daily before a meal. 60 capsule 1  . sucralfate (CARAFATE) 1 G tablet Take 1 tablet (1 g total) by mouth 4 (four) times daily -  with meals and at bedtime. 120 tablet 0   No current facility-administered medications on file prior to visit.    No Known Allergies  Family History  Problem Relation Age of Onset  . Diabetes Mother     Living  . Hypertension      family history  . Prostate cancer Father     family history  . Heart disease Paternal Uncle   . Heart disease Father   . Heart attack Father 6    Deceased  . Diabetes Sister     x3  . Cancer Other     Aunts & Uncles  . Cancer Other     Grandparents  . Heart attack Paternal Uncle   . Hyperlipidemia Mother   . Hyperlipidemia Father   . Hyperlipidemia Sister   . Diabetes Brother     x1  . Hyperlipidemia Brother     x1      History   Social History  . Marital Status: Married    Spouse Name: N/A    Number of Children: N/A  . Years of Education: N/A   Social History Main Topics  . Smoking status: Former Smoker -- 3 years    Quit date: 04/04/2012  . Smokeless tobacco: Never Used  . Alcohol Use: Yes     Comment: social  . Drug Use: Yes     Comment: marijuana rare  . Sexual Activity: Yes    Birth Control/ Protection: None     Comment: married   Other Topics Concern  . 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   Grew up in [place]   Married   1 son    Review of Systems - See HPI.  All other ROS are negative.  BP 131/91 mmHg  Pulse 77  Temp(Src) 98.5 F (36.9 C) (Oral)  Resp 16  Ht $R'5\' 7"'tr$  (1.702 m)  Wt 197 lb 4 oz (89.472 kg)  BMI 30.89 kg/m2  SpO2 99%  Physical Exam  Constitutional: He is well-developed, well-nourished, and in no distress.  HENT:  Head: Normocephalic and atraumatic.  Eyes: Conjunctivae are normal.  Cardiovascular: Normal rate, regular rhythm, normal heart sounds and intact distal pulses.   Pulmonary/Chest: Effort normal and breath sounds normal. No respiratory distress. He has no wheezes. He has no rales. He exhibits no tenderness.  Abdominal: Soft. Bowel sounds are normal. He exhibits no distension and no mass. There is tenderness. There is no rebound and no guarding.  Skin: Skin is warm and dry. No rash noted.  Psychiatric: Affect normal.  Vitals reviewed.   Recent Results (from the past 2160 hour(s))  CBC     Status: None   Collection Time: 03/08/14 10:43 AM  Result Value Ref Range   WBC 6.5 4.0 - 10.5 K/uL   RBC 5.08 4.22 - 5.81 Mil/uL   Platelets 193.0 150.0 - 400.0 K/uL   Hemoglobin 15.2 13.0 - 17.0 g/dL   HCT 44.5 39.0 - 52.0 %   MCV 87.5 78.0 - 100.0 fl   MCHC 34.3 30.0 - 36.0 g/dL   RDW 13.8 11.5 - 15.5 %  Comp Met (CMET)     Status: Abnormal   Collection Time: 03/08/14 10:43 AM  Result Value Ref Range    Sodium 137 135 - 145 mEq/L   Potassium 3.6 3.5 - 5.1 mEq/L   Chloride 102 96 - 112 mEq/L   CO2 30 19 - 32 mEq/L   Glucose, Bld 111 (H) 70 - 99 mg/dL   BUN 10 6 - 23 mg/dL   Creatinine, Ser 1.2 0.4 - 1.5 mg/dL   Total Bilirubin 0.4 0.2 - 1.2 mg/dL   Alkaline Phosphatase 68 39 - 117 U/L   AST 21 0 - 37 U/L   ALT 22 0 - 53 U/L   Total Protein 7.4 6.0 - 8.3 g/dL   Albumin 4.2 3.5 - 5.2 g/dL   Calcium 9.1 8.4 - 10.5 mg/dL   GFR 83.95 >60.00 mL/min  Lipase     Status: None   Collection Time: 03/08/14 10:43 AM  Result Value Ref Range   Lipase 27.0 11.0 - 59.0 U/L  H. pylori antibody, IgG     Status: Abnormal   Collection Time: 03/08/14 10:43 AM  Result Value Ref Range   H Pylori IgG Positive (A) Negative    Assessment/Plan: PUD (peptic ulcer disease) Pain is most likely secondary to ulcer and not medication.  Reassurance given to patient that it will take time for ulceration to heal.  Current diet is not appropriate and is not helping current symptoms.  PUD diet again reiterated.  Continue current regimen. Rx Tramadol for breakthrough pain.  Follow-up in 1 week.  If rectal bleeding is noted, patient is to call as urgent GI consult will be needed.

## 2014-03-14 NOTE — Patient Instructions (Signed)
Please continue antibiotics as directed.  Use Tramadol for pain.  Follow the diet below.  Call or return to clinic if symptoms are not starting to improve over the next few days.  If anything worsens, we will have to set you up with GI.  Food Choices for Peptic Ulcer Disease When you have peptic ulcer disease, the foods you eat and your eating habits are very important. Choosing the right foods can help ease the discomfort of peptic ulcer disease. WHAT GENERAL GUIDELINES DO I NEED TO FOLLOW?  Choose fruits, vegetables, whole grains, and low-fat meat, fish, and poultry.   Keep a food diary to identify foods that cause symptoms.  Avoid foods that cause irritation or pain. These may be different for different people.  Eat frequent small meals instead of three large meals each day. The pain may be worse when your stomach is empty.  Avoid eating close to bedtime. WHAT FOODS ARE NOT RECOMMENDED? The following are some foods and drinks that may worsen your symptoms:  Black, white, and red pepper.  Hot sauce.  Chili peppers.  Chili powder.  Chocolate and cocoa.   Alcohol.  Tea, coffee, and cola (regular and decaffeinated). The items listed above may not be a complete list of foods and beverages to avoid. Contact your dietitian for more information. Document Released: 05/13/2011 Document Revised: 02/23/2013 Document Reviewed: 12/23/2012 [facility] Patient Information 2015 [employer] [place]. This information is not intended to replace advice given to you by your health care provider. Make sure you discuss any questions you have with your health care provider.

## 2014-03-14 NOTE — Progress Notes (Signed)
Pre visit review using our clinic review tool, if applicable. No additional management support is needed unless otherwise documented below in the visit note/SLS  

## 2014-03-14 NOTE — Assessment & Plan Note (Signed)
Pain is most likely secondary to ulcer and not medication.  Reassurance given to patient that it will take time for ulceration to heal.  Current diet is not appropriate and is not helping current symptoms.  PUD diet again reiterated.  Continue current regimen. Rx Tramadol for breakthrough pain.  Follow-up in 1 week.  If rectal bleeding is noted, patient is to call as urgent GI consult will be needed.

## 2014-03-22 ENCOUNTER — Ambulatory Visit (INDEPENDENT_AMBULATORY_CARE_PROVIDER_SITE_OTHER): Payer: BLUE CROSS/BLUE SHIELD | Admitting: Physician Assistant

## 2014-03-22 ENCOUNTER — Encounter: Payer: Self-pay | Admitting: Physician Assistant

## 2014-03-22 VITALS — BP 133/91 | HR 80 | Temp 98.4°F | Resp 16 | Ht 70.0 in | Wt 198.1 lb

## 2014-03-22 DIAGNOSIS — R0602 Shortness of breath: Secondary | ICD-10-CM

## 2014-03-22 DIAGNOSIS — R030 Elevated blood-pressure reading, without diagnosis of hypertension: Secondary | ICD-10-CM

## 2014-03-22 DIAGNOSIS — R0989 Other specified symptoms and signs involving the circulatory and respiratory systems: Secondary | ICD-10-CM

## 2014-03-22 DIAGNOSIS — K279 Peptic ulcer, site unspecified, unspecified as acute or chronic, without hemorrhage or perforation: Secondary | ICD-10-CM

## 2014-03-22 LAB — COMPREHENSIVE METABOLIC PANEL
ALK PHOS: 66 U/L (ref 39–117)
ALT: 16 U/L (ref 0–53)
AST: 22 U/L (ref 0–37)
Albumin: 4.2 g/dL (ref 3.5–5.2)
BUN: 9 mg/dL (ref 6–23)
CALCIUM: 9.3 mg/dL (ref 8.4–10.5)
CO2: 33 meq/L — AB (ref 19–32)
Chloride: 103 mEq/L (ref 96–112)
Creatinine, Ser: 1.12 mg/dL (ref 0.40–1.50)
GFR: 92.64 mL/min (ref >60.00)
Glucose, Bld: 98 mg/dL (ref 70–99)
POTASSIUM: 3.6 meq/L (ref 3.5–5.1)
Sodium: 138 mEq/L (ref 135–145)
Total Bilirubin: 0.4 mg/dL (ref 0.2–1.2)
Total Protein: 7.2 g/dL (ref 6.0–8.3)

## 2014-03-22 LAB — CBC
HCT: 44 % (ref 39.0–52.0)
Hemoglobin: 14.9 g/dL (ref 13.0–17.0)
MCHC: 33.8 g/dL (ref 30.0–36.0)
MCV: 88.4 fl (ref 78.0–100.0)
PLATELETS: 213 10*3/uL (ref 150.0–400.0)
RBC: 4.98 Mil/uL (ref 4.22–5.81)
RDW: 13.6 % (ref 11.5–15.5)
WBC: 4.8 10*3/uL (ref 4.0–10.5)

## 2014-03-22 NOTE — Progress Notes (Signed)
Pre visit review using our clinic review tool, if applicable. No additional management support is needed unless otherwise documented below in the visit note/SLS  

## 2014-03-22 NOTE — Patient Instructions (Signed)
Please continue the Prilosec twice daily as directed.  Stay well hydrated.  Continue to check your BP once daily.  Write down all measurements.  Bring to follow-up in 2 weeks.  You will be contacted for a stress test.  If you develop any chest pain or shortness of breath at rest, please call 911 or proceed to the ER.  Please go to the lab for blood work.  I will call you with your results.   Follow-up with me in 2 weeks.

## 2014-03-22 NOTE — Assessment & Plan Note (Signed)
Symptoms improved.  Denies pain, reflux, tenesmus, melena to hematochezia.  Does endorse labile BP.  Workup in progress.  Continue Prilosec BID for now. Return in 1 week for H. Pylori breath test.

## 2014-03-23 DIAGNOSIS — R0602 Shortness of breath: Secondary | ICD-10-CM | POA: Insufficient documentation

## 2014-03-23 NOTE — Progress Notes (Signed)
Patient presents to clinic today for 2-week follow-up of H. Pylori + PUD.  Patient has completed entire course of Triple Therapy.  Has continued PPI BID.  Endorses resolution of pain. Some mild residual heartburn that is only noticed if he forgets PPI.  Denies nausea or vomiting.  Denies hematochezia or melena.   Does endorses labile BP at home ranging from 90/70 - 150/100.  BP has been overall good at visits here.  BP today at 133/91.  Denies chest pain, palpitations, LH or dizziness.  Endorses occasional SOB on exertion.   Past Medical History  Diagnosis Date  . Gout   . 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 Tomi Likens).  MRI brain ordered.  . Migraines   . Chicken pox     Current Outpatient Prescriptions on File Prior to Visit  Medication Sig Dispense Refill  . COLCRYS 0.6 MG tablet TAKE 1 TABLET (0.6 MG TOTAL) BY MOUTH 2 (TWO) TIMES DAILY AS NEEDED. 20 tablet 0  . omeprazole (PRILOSEC) 20 MG capsule Take 1 capsule (20 mg total) by mouth 2 (two) times daily before a meal. 60 capsule 1  . sucralfate (CARAFATE) 1 G tablet Take 1 tablet (1 g total) by mouth 4 (four) times daily -  with meals and at bedtime. 120 tablet 0  . traMADol (ULTRAM) 50 MG tablet Take 1 tablet (50 mg total) by mouth every 8 (eight) hours as needed. 60 tablet 0   No current facility-administered medications on file prior to visit.    No Known Allergies  Family History  Problem Relation Age of Onset  . Diabetes Mother     Living  . Hypertension      family history  . Prostate cancer Father     family history  . Heart disease Paternal Uncle   . Heart disease Father   . Heart attack Father 63    Deceased  . Diabetes Sister     x3  . Cancer Other     Aunts & Uncles  . Cancer Other     Grandparents  . Heart attack Paternal Uncle   . Hyperlipidemia Mother   . Hyperlipidemia Father   . Hyperlipidemia Sister   . Diabetes Brother     x1  .  Hyperlipidemia Brother     x1    History   Social History  . Marital Status: Married    Spouse Name: N/A    Number of Children: N/A  . Years of Education: N/A   Social History Main Topics  . Smoking status: Former Smoker -- 3 years    Quit date: 04/04/2012  . Smokeless tobacco: Never Used  . Alcohol Use: Yes     Comment: social  . Drug Use: Yes     Comment: marijuana rare  . Sexual Activity: Yes    Birth Control/ Protection: None     Comment: married   Other Topics Concern  . 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   Grew up in [place]   Married   1 son   Review of Systems - See HPI.  All other ROS are negative.  BP 133/91 mmHg  Pulse 80  Temp(Src) 98.4 F (36.9 C) (Oral)  Resp 16  Ht $R'5\' 10"'Oh$  (1.778 m)  Wt 198 lb 2 oz (89.869 kg)  BMI 28.43 kg/m2  SpO2 100%  Physical Exam  Constitutional: He  is oriented to person, place, and time and well-developed, well-nourished, and in no distress.  HENT:  Head: Normocephalic and atraumatic.  Mouth/Throat: Oropharynx is clear and moist.  Eyes: Conjunctivae are normal. Pupils are equal, round, and reactive to light.  Neck: Neck supple.  Cardiovascular: Normal rate, regular rhythm, normal heart sounds and intact distal pulses.   Pulmonary/Chest: Effort normal and breath sounds normal. No respiratory distress. He has no wheezes. He has no rales. He exhibits no tenderness.  Abdominal: Soft. Bowel sounds are normal. He exhibits no distension and no mass. There is no tenderness. There is no rebound and no guarding.  Neurological: He is alert and oriented to person, place, and time. No cranial nerve deficit.  Skin: Skin is warm and dry. No rash noted.  Psychiatric: Affect normal.  Vitals reviewed.   Recent Results (from the past 2160 hour(s))  CBC     Status: None   Collection Time: 03/08/14 10:43 AM  Result Value Ref Range   WBC 6.5 4.0 - 10.5 K/uL   RBC 5.08 4.22 - 5.81 Mil/uL     Platelets 193.0 150.0 - 400.0 K/uL   Hemoglobin 15.2 13.0 - 17.0 g/dL   HCT 44.5 39.0 - 52.0 %   MCV 87.5 78.0 - 100.0 fl   MCHC 34.3 30.0 - 36.0 g/dL   RDW 13.8 11.5 - 15.5 %  Comp Met (CMET)     Status: Abnormal   Collection Time: 03/08/14 10:43 AM  Result Value Ref Range   Sodium 137 135 - 145 mEq/L   Potassium 3.6 3.5 - 5.1 mEq/L   Chloride 102 96 - 112 mEq/L   CO2 30 19 - 32 mEq/L   Glucose, Bld 111 (H) 70 - 99 mg/dL   BUN 10 6 - 23 mg/dL   Creatinine, Ser 1.2 0.4 - 1.5 mg/dL   Total Bilirubin 0.4 0.2 - 1.2 mg/dL   Alkaline Phosphatase 68 39 - 117 U/L   AST 21 0 - 37 U/L   ALT 22 0 - 53 U/L   Total Protein 7.4 6.0 - 8.3 g/dL   Albumin 4.2 3.5 - 5.2 g/dL   Calcium 9.1 8.4 - 10.5 mg/dL   GFR 83.95 >60.00 mL/min  Lipase     Status: None   Collection Time: 03/08/14 10:43 AM  Result Value Ref Range   Lipase 27.0 11.0 - 59.0 U/L  H. pylori antibody, IgG     Status: Abnormal   Collection Time: 03/08/14 10:43 AM  Result Value Ref Range   H Pylori IgG Positive (A) Negative  CBC     Status: None   Collection Time: 03/22/14 10:05 AM  Result Value Ref Range   WBC 4.8 4.0 - 10.5 K/uL   RBC 4.98 4.22 - 5.81 Mil/uL   Platelets 213.0 150.0 - 400.0 K/uL   Hemoglobin 14.9 13.0 - 17.0 g/dL   HCT 44.0 39.0 - 52.0 %   MCV 88.4 78.0 - 100.0 fl   MCHC 33.8 30.0 - 36.0 g/dL   RDW 13.6 11.5 - 15.5 %  Comp Met (CMET)     Status: Abnormal   Collection Time: 03/22/14 10:05 AM  Result Value Ref Range   Sodium 138 135 - 145 mEq/L   Potassium 3.6 3.5 - 5.1 mEq/L   Chloride 103 96 - 112 mEq/L   CO2 33 (H) 19 - 32 mEq/L   Glucose, Bld 98 70 - 99 mg/dL   BUN 9 6 - 23 mg/dL   Creatinine, Ser  1.12 0.40 - 1.50 mg/dL   Total Bilirubin 0.4 0.2 - 1.2 mg/dL   Alkaline Phosphatase 66 39 - 117 U/L   AST 22 0 - 37 U/L   ALT 16 0 - 53 U/L   Total Protein 7.2 6.0 - 8.3 g/dL   Albumin 4.2 3.5 - 5.2 g/dL   Calcium 9.3 8.4 - 10.5 mg/dL   GFR 92.64 >60.00 mL/min    Assessment/Plan: PUD (peptic  ulcer disease) Symptoms improved.  Denies pain, reflux, tenesmus, melena to hematochezia.  Does endorse labile BP.  Workup in progress.  Continue Prilosec BID for now. Return in 1 week for H. Pylori breath test.   Labile blood pressure BP stable in clinic. OVS are negative. Recommend consistency with diet and salt intake.  Check BP daily.  Write down and bring to follow-up in 2 weeks.  Will check CBC and BMP today.   Shortness of breath on exertion Exam within normal limits. No history of anemia or cardiopulmonary disease.  BP stable in clinic.  Respirations are steady and non-labored.  OVS are negative.  EKG within normal limits.  Will obtain CBC and CMP today.  Order placed for CXR and Echocardiogram to further assess giving family history.

## 2014-03-23 NOTE — Assessment & Plan Note (Signed)
Exam within normal limits. No history of anemia or cardiopulmonary disease.  BP stable in clinic.  Respirations are steady and non-labored.  OVS are negative.  EKG within normal limits.  Will obtain CBC and CMP today.  Order placed for CXR and Echocardiogram to further assess giving family history.

## 2014-03-23 NOTE — Assessment & Plan Note (Signed)
BP stable in clinic. OVS are negative. Recommend consistency with diet and salt intake.  Check BP daily.  Write down and bring to follow-up in 2 weeks.  Will check CBC and BMP today.

## 2014-03-28 ENCOUNTER — Other Ambulatory Visit (HOSPITAL_COMMUNITY): Payer: BLUE CROSS/BLUE SHIELD

## 2014-03-28 ENCOUNTER — Telehealth: Payer: Self-pay | Admitting: *Deleted

## 2014-03-28 NOTE — Telephone Encounter (Signed)
Notes Recorded by Regis BillSharon L , CMA on 03/28/2014 at 2:42 PM Patient informed, understood & states he has not heard from Cardio regarding Stress Test ordered by PCP; informed that system could be running behind d/t inclement weather and that if he has not received phone call RE this test by Wednesday to give me a call back so that I may check on the status for him; pt agreed/SLS  Spoke with Victorino DikeJennifer [PCC] regarding this appointment as there is a Teacher, early years/prerecertification Authorization in EMR completed on 01.20.16; patient was given phone call by our office to inform him of his appointment scheduled for today, 01[facility]25.16 [facility] [address] Patient given contact information for [facility] office and instructed to call now and let them know that he missed our initial communication on this appointment and to ask to reschedule appointment for this ordered test by PCP; pt understood & agreed/SLS

## 2014-03-29 ENCOUNTER — Encounter (HOSPITAL_COMMUNITY): Payer: Self-pay | Admitting: Physician Assistant

## 2014-04-05 ENCOUNTER — Ambulatory Visit: Payer: BLUE CROSS/BLUE SHIELD | Admitting: Physician Assistant

## 2014-04-05 ENCOUNTER — Encounter (HOSPITAL_COMMUNITY): Payer: Self-pay | Admitting: *Deleted

## 2014-04-05 ENCOUNTER — Emergency Department (HOSPITAL_COMMUNITY): Payer: BLUE CROSS/BLUE SHIELD

## 2014-04-05 ENCOUNTER — Telehealth: Payer: Self-pay | Admitting: *Deleted

## 2014-04-05 ENCOUNTER — Observation Stay (HOSPITAL_COMMUNITY): Payer: BLUE CROSS/BLUE SHIELD

## 2014-04-05 ENCOUNTER — Inpatient Hospital Stay (HOSPITAL_COMMUNITY)
Admission: EM | Admit: 2014-04-05 | Discharge: 2014-04-12 | DRG: 392 | Disposition: A | Discharge status: Home / Self Care | Payer: BLUE CROSS/BLUE SHIELD | Attending: Internal Medicine | Admitting: Internal Medicine

## 2014-04-05 DIAGNOSIS — Z87891 Personal history of nicotine dependence: Secondary | ICD-10-CM | POA: Diagnosis not present

## 2014-04-05 DIAGNOSIS — Z8711 Personal history of peptic ulcer disease: Secondary | ICD-10-CM | POA: Diagnosis not present

## 2014-04-05 DIAGNOSIS — Z79899 Other long term (current) drug therapy: Secondary | ICD-10-CM | POA: Diagnosis not present

## 2014-04-05 DIAGNOSIS — K529 Noninfective gastroenteritis and colitis, unspecified: Secondary | ICD-10-CM | POA: Diagnosis present

## 2014-04-05 DIAGNOSIS — R634 Abnormal weight loss: Secondary | ICD-10-CM | POA: Diagnosis present

## 2014-04-05 DIAGNOSIS — R1011 Right upper quadrant pain: Secondary | ICD-10-CM

## 2014-04-05 DIAGNOSIS — K8012 Calculus of gallbladder with acute and chronic cholecystitis without obstruction: Secondary | ICD-10-CM | POA: Diagnosis present

## 2014-04-05 DIAGNOSIS — K210 Gastro-esophageal reflux disease with esophagitis: Secondary | ICD-10-CM | POA: Diagnosis present

## 2014-04-05 DIAGNOSIS — K819 Cholecystitis, unspecified: Secondary | ICD-10-CM

## 2014-04-05 DIAGNOSIS — E876 Hypokalemia: Secondary | ICD-10-CM | POA: Diagnosis not present

## 2014-04-05 DIAGNOSIS — A09 Infectious gastroenteritis and colitis, unspecified: Principal | ICD-10-CM | POA: Diagnosis present

## 2014-04-05 DIAGNOSIS — R1013 Epigastric pain: Secondary | ICD-10-CM

## 2014-04-05 DIAGNOSIS — K219 Gastro-esophageal reflux disease without esophagitis: Secondary | ICD-10-CM | POA: Diagnosis present

## 2014-04-05 DIAGNOSIS — K8020 Calculus of gallbladder without cholecystitis without obstruction: Secondary | ICD-10-CM | POA: Diagnosis present

## 2014-04-05 DIAGNOSIS — K922 Gastrointestinal hemorrhage, unspecified: Secondary | ICD-10-CM

## 2014-04-05 DIAGNOSIS — K209 Esophagitis, unspecified without bleeding: Secondary | ICD-10-CM

## 2014-04-05 DIAGNOSIS — R109 Unspecified abdominal pain: Secondary | ICD-10-CM | POA: Diagnosis present

## 2014-04-05 DIAGNOSIS — R100 Acute abdomen: Secondary | ICD-10-CM | POA: Diagnosis present

## 2014-04-05 HISTORY — DX: Epigastric pain: R10.13

## 2014-04-05 LAB — COMPREHENSIVE METABOLIC PANEL
ALBUMIN: 4.7 g/dL (ref 3.5–5.2)
ALT: 35 U/L (ref 0–53)
ANION GAP: 8 (ref 5–15)
AST: 37 U/L (ref 0–37)
Alkaline Phosphatase: 79 U/L (ref 39–117)
BUN: 8 mg/dL (ref 6–23)
CO2: 29 mmol/L (ref 19–32)
CREATININE: 1.08 mg/dL (ref 0.50–1.35)
Calcium: 9.1 mg/dL (ref 8.4–10.5)
Chloride: 99 mmol/L (ref 96–112)
GFR calc non Af Amer: 84 mL/min — ABNORMAL LOW (ref >90)
Glucose, Bld: 157 mg/dL — ABNORMAL HIGH (ref 70–99)
Potassium: 3.9 mmol/L (ref 3.5–5.1)
Sodium: 136 mmol/L (ref 135–145)
TOTAL PROTEIN: 8 g/dL (ref 6.0–8.3)
Total Bilirubin: 0.8 mg/dL (ref 0.3–1.2)

## 2014-04-05 LAB — CBC WITH DIFFERENTIAL/PLATELET
Basophils Absolute: 0 10*3/uL (ref 0.0–0.1)
Basophils Relative: 0 % (ref 0–1)
EOS PCT: 0 % (ref 0–5)
Eosinophils Absolute: 0 10*3/uL (ref 0.0–0.7)
HCT: 47.6 % (ref 39.0–52.0)
Hemoglobin: 17.5 g/dL — ABNORMAL HIGH (ref 13.0–17.0)
LYMPHS ABS: 0.9 10*3/uL (ref 0.7–4.0)
LYMPHS PCT: 9 % — AB (ref 12–46)
MCH: 31.3 pg (ref 26.0–34.0)
MCHC: 36.8 g/dL — ABNORMAL HIGH (ref 30.0–36.0)
MCV: 85 fL (ref 78.0–100.0)
Monocytes Absolute: 0.3 10*3/uL (ref 0.1–1.0)
Monocytes Relative: 3 % (ref 3–12)
NEUTROS PCT: 88 % — AB (ref 43–77)
Neutro Abs: 9 10*3/uL — ABNORMAL HIGH (ref 1.7–7.7)
PLATELETS: 179 10*3/uL (ref 150–400)
RBC: 5.6 MIL/uL (ref 4.22–5.81)
RDW: 12.8 % (ref 11.5–15.5)
WBC: 10.2 10*3/uL (ref 4.0–10.5)

## 2014-04-05 LAB — TYPE AND SCREEN
ABO/RH(D): O POS
Antibody Screen: NEGATIVE

## 2014-04-05 LAB — POC OCCULT BLOOD, ED: Fecal Occult Bld: POSITIVE — AB

## 2014-04-05 LAB — LIPASE, BLOOD: Lipase: 26 U/L (ref 11–59)

## 2014-04-05 LAB — CLOSTRIDIUM DIFFICILE BY PCR: CDIFFPCR: NEGATIVE

## 2014-04-05 LAB — ABO/RH: ABO/RH(D): O POS

## 2014-04-05 LAB — BRAIN NATRIURETIC PEPTIDE: B NATRIURETIC PEPTIDE 5: 12.3 pg/mL (ref 0.0–100.0)

## 2014-04-05 LAB — TROPONIN I: Troponin I: <0.03 ng/mL (ref <0.031)

## 2014-04-05 MED ORDER — IOHEXOL 300 MG/ML  SOLN
25.0000 mL | INTRAMUSCULAR | Status: AC
  Administered 2014-04-05: 25 mL via ORAL

## 2014-04-05 MED ORDER — SODIUM CHLORIDE 0.9 % IV SOLN
80.0000 mg | Freq: Once | INTRAVENOUS | Status: AC
  Administered 2014-04-05: 80 mg via INTRAVENOUS
  Filled 2014-04-05: qty 80

## 2014-04-05 MED ORDER — ONDANSETRON HCL 4 MG/2ML IJ SOLN
4.0000 mg | Freq: Once | INTRAMUSCULAR | Status: AC
  Administered 2014-04-05: 4 mg via INTRAVENOUS
  Filled 2014-04-05: qty 2

## 2014-04-05 MED ORDER — PANTOPRAZOLE SODIUM 40 MG IV SOLR
40.0000 mg | Freq: Two times a day (BID) | INTRAVENOUS | Status: DC
  Administered 2014-04-05 – 2014-04-11 (×12): 40 mg via INTRAVENOUS
  Filled 2014-04-05 (×14): qty 40

## 2014-04-05 MED ORDER — ALBUTEROL SULFATE (2.5 MG/3ML) 0.083% IN NEBU: 2.5000 mg | INHALATION_SOLUTION | RESPIRATORY_TRACT | Status: DC | PRN

## 2014-04-05 MED ORDER — ACETAMINOPHEN 650 MG RE SUPP: 650.0000 mg | Freq: Four times a day (QID) | RECTAL | Status: DC | PRN

## 2014-04-05 MED ORDER — ACETAMINOPHEN 325 MG PO TABS
650.0000 mg | ORAL_TABLET | Freq: Four times a day (QID) | ORAL | Status: DC | PRN
  Administered 2014-04-06: 650 mg via ORAL
  Filled 2014-04-05 (×3): qty 2

## 2014-04-05 MED ORDER — SODIUM CHLORIDE 0.9 % IJ SOLN
3.0000 mL | Freq: Two times a day (BID) | INTRAMUSCULAR | Status: DC
  Administered 2014-04-05 – 2014-04-12 (×9): 3 mL via INTRAVENOUS

## 2014-04-05 MED ORDER — IOHEXOL 300 MG/ML  SOLN
100.0000 mL | Freq: Once | INTRAMUSCULAR | Status: AC | PRN
  Administered 2014-04-05: 100 mL via INTRAVENOUS

## 2014-04-05 MED ORDER — HYDROMORPHONE HCL 1 MG/ML IJ SOLN
1.0000 mg | INTRAMUSCULAR | Status: DC | PRN
  Administered 2014-04-05 (×2): 1 mg via INTRAVENOUS
  Filled 2014-04-05 (×2): qty 1

## 2014-04-05 MED ORDER — METRONIDAZOLE IN NACL 5-0.79 MG/ML-% IV SOLN
500.0000 mg | Freq: Two times a day (BID) | INTRAVENOUS | Status: DC
  Administered 2014-04-05 – 2014-04-10 (×10): 500 mg via INTRAVENOUS
  Filled 2014-04-05 (×12): qty 100

## 2014-04-05 MED ORDER — SODIUM CHLORIDE 0.9 % IV SOLN
INTRAVENOUS | Status: DC
  Administered 2014-04-05: 11:00:00 via INTRAVENOUS
  Administered 2014-04-06: 100 mL/h via INTRAVENOUS
  Administered 2014-04-06 – 2014-04-12 (×8): via INTRAVENOUS

## 2014-04-05 MED ORDER — ONDANSETRON HCL 4 MG/2ML IJ SOLN
4.0000 mg | Freq: Four times a day (QID) | INTRAMUSCULAR | Status: DC | PRN
  Administered 2014-04-08 – 2014-04-11 (×2): 4 mg via INTRAVENOUS
  Filled 2014-04-05 (×2): qty 2

## 2014-04-05 MED ORDER — GUAIFENESIN-DM 100-10 MG/5ML PO SYRP
5.0000 mL | ORAL_SOLUTION | ORAL | Status: DC | PRN
  Filled 2014-04-05: qty 5

## 2014-04-05 MED ORDER — HYDROMORPHONE HCL 1 MG/ML IJ SOLN
1.0000 mg | INTRAMUSCULAR | Status: DC | PRN
  Administered 2014-04-05: 1 mg via INTRAVENOUS
  Administered 2014-04-05 – 2014-04-07 (×7): 2 mg via INTRAVENOUS
  Administered 2014-04-07: 1 mg via INTRAVENOUS
  Administered 2014-04-07 – 2014-04-10 (×7): 2 mg via INTRAVENOUS
  Administered 2014-04-10: 1 mg via INTRAVENOUS
  Administered 2014-04-10 – 2014-04-11 (×3): 2 mg via INTRAVENOUS
  Administered 2014-04-11: 1 mg via INTRAVENOUS
  Filled 2014-04-05: qty 1
  Filled 2014-04-05 (×10): qty 2
  Filled 2014-04-05: qty 1
  Filled 2014-04-05 (×5): qty 2
  Filled 2014-04-05: qty 1
  Filled 2014-04-05 (×3): qty 2

## 2014-04-05 MED ORDER — PANTOPRAZOLE SODIUM 40 MG IV SOLR
40.0000 mg | Freq: Once | INTRAVENOUS | Status: AC
  Administered 2014-04-05: 40 mg via INTRAVENOUS
  Filled 2014-04-05: qty 40

## 2014-04-05 MED ORDER — ONDANSETRON HCL 4 MG PO TABS: 4.0000 mg | ORAL_TABLET | Freq: Four times a day (QID) | ORAL | Status: DC | PRN

## 2014-04-05 MED ORDER — HYDROMORPHONE HCL 1 MG/ML IJ SOLN
1.0000 mg | Freq: Once | INTRAMUSCULAR | Status: AC
  Administered 2014-04-05: 1 mg via INTRAVENOUS
  Filled 2014-04-05: qty 1

## 2014-04-05 MED ORDER — CIPROFLOXACIN IN D5W 400 MG/200ML IV SOLN
400.0000 mg | Freq: Two times a day (BID) | INTRAVENOUS | Status: DC
  Administered 2014-04-05 – 2014-04-10 (×10): 400 mg via INTRAVENOUS
  Filled 2014-04-05 (×11): qty 200

## 2014-04-05 MED ORDER — SODIUM CHLORIDE 0.9 % IV BOLUS (SEPSIS)
1000.0000 mL | Freq: Once | INTRAVENOUS | Status: AC
  Administered 2014-04-05: 1000 mL via INTRAVENOUS

## 2014-04-05 NOTE — ED Notes (Signed)
Called main lab to have lipase added on.

## 2014-04-05 NOTE — Telephone Encounter (Signed)
Pt did not show to appointment 04/05/2014 at 2:00pm for 2 week follow up.  [facility].  Charge no show fee?

## 2014-04-05 NOTE — ED Notes (Signed)
Marissa, PA at the bedside, gives verbal order to add on lipase lab.

## 2014-04-05 NOTE — Consult Note (Signed)
[facility] Consult Note  Referring Provider: No ref. provider found Primary Care Physician:  Leeanne Rio, PA-C Primary Gastroenterologist:  Dr.  Laurel Dimmer Complaint: Abdominal pain nausea vomiting and bloody stools HPI: Anthony Gill is an 44 y.o. black male  who presents with onset of multiple episodes of nausea and vomiting with abdominal pain since midnight with onset of diarrhea shortly afterwards. He began having bloody stools this morning and ongoing wavelike abdominal pain. He denied any frank hematemesis but apparently his last emesis in the emergency room did look bloody. He has been seen by Velora Heckler primary care for dyspepsia for about the last month and was treated for H. pylori according to the patient he never had significant resolution of his epigastric pain when his acute symptoms began last night. He states he had an endoscopy in the last 2 weeks but I cannot find any evidence of it the chart. He has been receiving antibiotics for H. pylori during this time.  Past Medical History  Diagnosis Date  . Gout   . 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 Tomi Likens).  MRI brain ordered.  . Migraines   . Chicken pox   . Epigastric pain 04/05/2014    Past Surgical History  Procedure Laterality Date  . Hernia repair  2006  . Wisdom tooth extraction      Medications Prior to Admission  Medication Sig Dispense Refill  . omeprazole (PRILOSEC) 20 MG capsule Take 1 capsule (20 mg total) by mouth 2 (two) times daily before a meal. 60 capsule 1  . sucralfate (CARAFATE) 1 G tablet Take 1 tablet (1 g total) by mouth 4 (four) times daily -  with meals and at bedtime. 120 tablet 0  . traMADol (ULTRAM) 50 MG tablet Take 1 tablet (50 mg total) by mouth every 8 (eight) hours as needed. 60 tablet 0  . COLCRYS 0.6 MG tablet TAKE 1 TABLET (0.6 MG TOTAL) BY MOUTH 2 (TWO) TIMES DAILY AS NEEDED. (Patient not taking: Reported on  04/05/2014) 20 tablet 0    Allergies: No Known Allergies  Family History  Problem Relation Age of Onset  . Diabetes Mother     Living  . Hypertension      family history  . Prostate cancer Father     family history  . Heart disease Paternal Uncle   . Heart disease Father   . Heart attack Father 37    Deceased  . Diabetes Sister     x3  . Cancer Other     Aunts & Uncles  . Cancer Other     Grandparents  . Heart attack Paternal Uncle   . Hyperlipidemia Mother   . Hyperlipidemia Father   . Hyperlipidemia Sister   . Diabetes Brother     x1  . Hyperlipidemia Brother     x1    Social History:  reports that he quit smoking about 4 weeks ago. He has never used smokeless tobacco. He reports that he drinks alcohol. He reports that he uses illicit drugs.  Review of Systems: negative except as above.   Blood pressure 149/93, pulse 78, temperature 98.6 F (37 C), temperature source Oral, resp. rate 16, height _0  (1.702 m), weight 89.812 kg (198 lb), SpO2 99 %. he is alert and oriented and appeared comfortable beginning of the interview but then began having severe abdominal cramps. He just had a bowel movement which I saw that had some visible  blood in it. Head: Normocephalic, without obvious abnormality, atraumatic Neck: no adenopathy, no carotid bruit, no JVD, supple, symmetrical, trachea midline and thyroid not enlarged, symmetric, no tenderness/mass/nodules Resp: clear to auscultation bilaterally Cardio: regular rate and rhythm, S1, S2 normal, no murmur, click, rub or gallop GI: Diffuse abdominal tenderness with guarding but no rebound Extremities: extremities normal, atraumatic, no cyanosis or edema  Results for orders placed or performed during the hospital encounter of 04/05/14 (from the past 48 hour(s))  CBC with Differential/Platelet     Status: Abnormal   Collection Time: 04/05/14  6:15 AM  Result Value Ref Range   WBC 10.2 4.0 - 10.5 K/uL   RBC 5.60 4.22 - 5.81  MIL/uL   Hemoglobin 17.5 (H) 13.0 - 17.0 g/dL   HCT 47.6 39.0 - 52.0 %   MCV 85.0 78.0 - 100.0 fL   MCH 31.3 26.0 - 34.0 pg   MCHC 36.8 (H) 30.0 - 36.0 g/dL   RDW 12.8 11.5 - 15.5 %   Platelets 179 150 - 400 K/uL   Neutrophils Relative % 88 (H) 43 - 77 %   Lymphocytes Relative 9 (L) 12 - 46 %   Monocytes Relative 3 3 - 12 %   Eosinophils Relative 0 0 - 5 %   Basophils Relative 0 0 - 1 %   Neutro Abs 9.0 (H) 1.7 - 7.7 K/uL   Lymphs Abs 0.9 0.7 - 4.0 K/uL   Monocytes Absolute 0.3 0.1 - 1.0 K/uL   Eosinophils Absolute 0.0 0.0 - 0.7 K/uL   Basophils Absolute 0.0 0.0 - 0.1 K/uL   Smear Review MORPHOLOGY UNREMARKABLE   Comprehensive metabolic panel     Status: Abnormal   Collection Time: 04/05/14  6:15 AM  Result Value Ref Range   Sodium 136 135 - 145 mmol/L   Potassium 3.9 3.5 - 5.1 mmol/L   Chloride 99 96 - 112 mmol/L   CO2 29 19 - 32 mmol/L   Glucose, Bld 157 (H) 70 - 99 mg/dL   BUN 8 6 - 23 mg/dL   Creatinine, Ser 1.08 0.50 - 1.35 mg/dL   Calcium 9.1 8.4 - 10.5 mg/dL   Total Protein 8.0 6.0 - 8.3 g/dL   Albumin 4.7 3.5 - 5.2 g/dL   AST 37 0 - 37 U/L   ALT 35 0 - 53 U/L   Alkaline Phosphatase 79 39 - 117 U/L   Total Bilirubin 0.8 0.3 - 1.2 mg/dL   GFR calc non Af Amer 84 (L) >90 mL/min   GFR calc Af Amer >90 >90 mL/min    Comment: (NOTE) The eGFR has been calculated using the CKD EPI equation. This calculation has not been validated in all clinical situations. eGFR's persistently <90 mL/min signify possible Chronic Kidney Disease.    Anion gap 8 5 - 15  Troponin I     Status: None   Collection Time: 04/05/14  6:15 AM  Result Value Ref Range   Troponin I <0.03 <0.031 ng/mL    Comment:        NO INDICATION OF MYOCARDIAL INJURY.   Brain natriuretic peptide     Status: None   Collection Time: 04/05/14  6:15 AM  Result Value Ref Range   B Natriuretic Peptide 12.3 0.0 - 100.0 pg/mL  Lipase, blood     Status: None   Collection Time: 04/05/14  6:15 AM  Result Value Ref  Range   Lipase 26 11 - 59 U/L  POC occult blood, ED  Status: Abnormal   Collection Time: 04/05/14  7:10 AM  Result Value Ref Range   Fecal Occult Bld POSITIVE (A) NEGATIVE  Type and screen for Red Blood Exchange     Status: None   Collection Time: 04/05/14  7:30 AM  Result Value Ref Range   ABO/RH(D) O POS    Antibody Screen NEG    Sample Expiration 04/08/2014    Dg Abd Acute W/chest  04/05/2014   CLINICAL DATA:  Central chest pain and sharp epigastric pain starting yesterday.  EXAM: ACUTE ABDOMEN SERIES (ABDOMEN 2 VIEW & CHEST 1 VIEW)  COMPARISON:  11/05/2012  FINDINGS: The abdomen is relatively gasless, which decreases the sensitivity of radiography for detecting obstruction. There is no indication of bowel obstruction or perforation. No concerning intra-abdominal mass effect or calcification.  Normal heart size and mediastinal contours. No acute infiltrate or edema. No effusion or pneumothorax. No acute osseous findings.  IMPRESSION: 1. Essentially gasless abdomen with no evidence of bowel obstruction or perforation. 2. Negative chest.   Electronically Signed   By: Jorje Guild M.D.   On: 04/05/2014 08:01    Assessment: Constellation of abdominal pain and vomiting diarrhea which has become bloody and possible hematemesis. Status post recent treatment for H. pylori Plan:  CT of the abdomen has been ordered which I think would be helpful to determine whether he has a diffuse colitis are more likely an upper GI tract condition. May need endoscopy or colonoscopy depending on findings. We'll follow with you. , C 04/05/2014, 10:32 AM

## 2014-04-05 NOTE — Telephone Encounter (Signed)
See note below

## 2014-04-05 NOTE — Progress Notes (Signed)
GI addendum: CT scan shows both extensive colitis from the cecum to the sigmoid and gallstones with question of cholecystitis. Given his range of symptoms over time this patient has had it is possible he could be symptomatic from both although the colitis is obviously acute.. Will obtain abdominal ultrasound as well as workup for infectious colitis and began empiric antibiotics.

## 2014-04-05 NOTE — H&P (Signed)
PATIENT DETAILS Name: Anthony Gill Age: 44 y.o. Sex: male Date of Birth: [birth year] Admit Date: 04/05/2014 ZOX:WRUEAV, Sherley Bounds, PA-C   CHIEF COMPLAINT:  Abdominal pain, with vomiting and bloody diarrhea since yesterday  HPI: Anthony Gill is a 44 y.o. male with a Past Medical History of GERD,who presents today with the above noted complaint. Per patient, he was in his usual state of health until yesterday afternoon when he started developing upper and mid abdominal pain. Patient describes the pain as crampy, 10/10 it is worse without any radiation. This was associated with numerous episodes of vomiting and diarrhea. Vomitus is nonbloody, patient claims to have vomited around 10-15 times since yesterday. He also has loose watery stools mixed with blood. Because of persistent symptoms and worsening pain, patient presented to the emergency room for further evaluation and treatment. A x-ray of the abdomen was negative for acute abnormalities, I was subsequently asked to admit this patient for further evaluation and treatment Patient denied any fever, chest pain, shortness of breath, headache or dysuria.  ALLERGIES:  No Known Allergies  PAST MEDICAL HISTORY: Past Medical History  Diagnosis Date  . Gout   . 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 Everlena Cooper).  MRI brain ordered.  . Migraines   . Chicken pox   . Epigastric pain 04/05/2014    PAST SURGICAL HISTORY: Past Surgical History  Procedure Laterality Date  . Hernia repair  2006  . Wisdom tooth extraction      MEDICATIONS AT HOME: Prior to Admission medications   Medication Sig Start Date End Date Taking? Authorizing Provider  omeprazole (PRILOSEC) 20 MG capsule Take 1 capsule (20 mg total) by mouth 2 (two) times daily before a meal. 03/08/14  Yes Waldon Merl, PA-C  sucralfate (CARAFATE) 1 G tablet Take 1 tablet (1 g total) by mouth 4 (four) times daily -   with meals and at bedtime. 03/08/14  Yes Waldon Merl, PA-C  traMADol (ULTRAM) 50 MG tablet Take 1 tablet (50 mg total) by mouth every 8 (eight) hours as needed. 03/14/14  Yes Waldon Merl, PA-C  COLCRYS 0.6 MG tablet TAKE 1 TABLET (0.6 MG TOTAL) BY MOUTH 2 (TWO) TIMES DAILY AS NEEDED. Patient not taking: Reported on 04/05/2014    Jeoffrey Massed, MD    FAMILY HISTORY: Family History  Problem Relation Age of Onset  . Diabetes Mother     Living  . Hypertension      family history  . Prostate cancer Father     family history  . Heart disease Paternal Uncle   . Heart disease Father   . Heart attack Father 64    Deceased  . Diabetes Sister     x3  . Cancer Other     Aunts & Uncles  . Cancer Other     Grandparents  . Heart attack Paternal Uncle   . Hyperlipidemia Mother   . Hyperlipidemia Father   . Hyperlipidemia Sister   . Diabetes Brother     x1  . Hyperlipidemia Brother     x1    SOCIAL HISTORY:  reports that he quit smoking about 2 years ago. He has never used smokeless tobacco. He reports that he drinks alcohol. He reports that he uses illicit drugs.  REVIEW OF SYSTEMS:  Constitutional:   No  weight loss, night sweats,  Fevers, chills, fatigue.  HEENT:  No headaches, Difficulty swallowing,Tooth/dental problems,Sore throat,    Cardio-vascular: No chest pain,  Orthopnea, PND, swelling in lower extremities, anasarca  GI:  No heartburn, indigestion, abdominal pain, nausea, vomiting, diarrhea, change in       bowel habits, loss of appetite  Resp: No shortness of breath with exertion or at rest.  No excess mucus, no productive cough, No non-productive cough  Skin:  no rash or lesions.  GU:  no dysuria, change in color of urine, no urgency or frequency.  No flank pain.  Musculoskeletal: No joint pain or swelling.  No decreased range of motion.  No back pain.  Psych: No change in mood or affect. No depression or anxiety.  No memory loss.   PHYSICAL  EXAM: Blood pressure 152/99, pulse 76, temperature 97.7 F (36.5 C), temperature source Oral, resp. rate 21, height  (1.702 m), weight 89.812 kg (198 lb), SpO2 98 %.  General appearance :Awake, alert, not in any distress. Speech Clear. Not toxic Looking HEENT: Atraumatic and Normocephalic, pupils equally reactive to light and accomodation Neck: supple, no JVD. No cervical lymphadenopathy.  Chest:Good air entry bilaterally, no added sounds  CVS: S1 S2 regular, no murmurs.  Abdomen: Bowel sounds present, abdomen is soft with some guarding he is very tender in the periumbilical area, right mid and upper abdominal area without rebound.  Extremities: B/L Lower Ext shows no edema, both legs are warm to touch Neurology: Awake alert, and oriented X 3, CN II-XII intact, Non focal Skin:No Rash Wounds:N/A  LABS ON ADMISSION:   Recent Labs  04/05/14 0615  NA 136  K 3.9  CL 99  CO2 29  GLUCOSE 157*  BUN 8  CREATININE 1.08  CALCIUM 9.1    Recent Labs  04/05/14 0615  AST 37  ALT 35  ALKPHOS 79  BILITOT 0.8  PROT 8.0  ALBUMIN 4.7    Recent Labs  04/05/14 0615  LIPASE 26    Recent Labs  04/05/14 0615  WBC 10.2  NEUTROABS 9.0*  HGB 17.5*  HCT 47.6  MCV 85.0  PLT 179    Recent Labs  04/05/14 0615  TROPONINI <0.03   No results for input(s): DDIMER in the last 72 hours. Invalid input(s): POCBNP   RADIOLOGIC STUDIES ON ADMISSION: Dg Abd Acute W/chest  04/05/2014   CLINICAL DATA:  Central chest pain and sharp epigastric pain starting yesterday.  EXAM: ACUTE ABDOMEN SERIES (ABDOMEN 2 VIEW & CHEST 1 VIEW)  COMPARISON:  11/05/2012  FINDINGS: The abdomen is relatively gasless, which decreases the sensitivity of radiography for detecting obstruction. There is no indication of bowel obstruction or perforation. No concerning intra-abdominal mass effect or calcification.  Normal heart size and mediastinal contours. No acute infiltrate or edema. No effusion or pneumothorax.  No acute osseous findings.  IMPRESSION: 1. Essentially gasless abdomen with no evidence of bowel obstruction or perforation. 2. Negative chest.   Electronically Signed   By: Tiburcio Pea M.D.   On: 04/05/2014 08:01    EKG: Independently reviewed. Normal sinus rhythm  ASSESSMENT AND PLAN: Present on Admission:  . Acute abdominal pain with vomiting and bloody diarrhea: Suspect enteritis/colitis. He also has a having? PUD with H. pylori infection and was treated approximately a year ago with anti-H. pylori therapy-he however never had a EGD done in the past. For now, keep nothing by mouth, IV fluids, antiemetics and other supportive care. We will check C. difficile PCR, GI pathogen panel. Since he does not have fever or  leukocytosis, I will hold off on starting antibiotics for now. I have ordered a CT scan of the abdomen and pelvis to further evaluate. ED MD has already consulted gastroenterology (Dr. Madilyn FiremanHayes). Patient will be admitted, and his clinical course will be monitored and further recommendations will be made.   Marland Kitchen. GERD (gastroesophageal reflux disease): Continue with PPI  Further plan will depend as patient's clinical course evolves and further radiologic and laboratory data become available. Patient will be monitored closely.  Above noted plan was discussed with patient, he was in agreement.   DVT Prophylaxis: SCD's  Code Status: Full Code  Disposition Plan: Home when stable.   Total time spent for admission equals 45 minutes.  [facility], [facility] Pager [phone]  If 7PM-7AM, please contact night-coverage [email or web address] Password [email or web address] 04/05/2014, 9:09 AM

## 2014-04-05 NOTE — Telephone Encounter (Signed)
No charge.  Will monitor his hospital course.

## 2014-04-05 NOTE — Progress Notes (Signed)
Nutrition Brief Note  Patient identified on the Malnutrition Screening Tool (MST) Report  Wt Readings from Last 15 Encounters:  04/05/14 198 lb (89.812 kg)  03/22/14 198 lb 2 oz (89.869 kg)  03/14/14 197 lb 4 oz (89.472 kg)  03/08/14 201 lb 4 oz (91.286 kg)  06/09/13 206 lb (93.441 kg)  04/28/13 206 lb 6 oz (93.611 kg)  04/15/13 210 lb (95.255 kg)  11/05/12 190 lb (86.183 kg)  09/10/11 199 lb (90.266 kg)  06/24/11 199 lb (90.266 kg)   Anthony Gill is a 44 y.o. male with a Past Medical History of GERD,who presents today with the above noted complaint. Per patient, he was in his usual state of health until yesterday afternoon when he started developing upper and mid abdominal pain.  Body mass index is 31 kg/(m^2). Patient meets criteria for obesity, class I based on current BMI.   Current diet order is NPO, patient is consuming approximately n/a% of meals at this time. Labs and medications reviewed.   No nutrition interventions warranted at this time. If nutrition issues arise, please consult RD.    A. Mayford KnifeWilliams, RD, LDN, CDE Pager: [phone] After hours Pager: [phone]

## 2014-04-05 NOTE — ED Notes (Signed)
Pt. Started having 10/10 central chest pain. Pt. Has had N/V with this pain and has thrown up 12 times since yesterday. Denies other symptoms.

## 2014-04-05 NOTE — ED Notes (Signed)
Admitting MD at the bedside.  

## 2014-04-05 NOTE — ED Provider Notes (Signed)
CSN: [id number]     Arrival date & time 04/05/14  0602 History   First MD Initiated Contact with Patient 04/05/14 [phone]     Chief Complaint  Patient presents with  . Chest Pain     (Consider location/radiation/quality/duration/timing/severity/associated sxs/prior Treatment) The history is provided by the patient. No language interpreter was used.  Anthony Gill is a 44 year old male with past medical history of Migraines presenting to emergency department with epigastric pain that started yesterday afternoon at approximately 1:00 PM. Patient reported as the day progressed the pain is gone progressively worse. Stated that he has a sharp shooting pain localize the epigastric region with radiation down to the left lower quadrant of his abdomen. Stated that with exacerbation of pain he spends his shortness of breath. Patient reported he had at least more than 12 episodes of emesis with right red blood specks. Stated that he has had at least several episodes of bowel movements with bright red blood. Stated that approximate 2 years ago had similar episodes of epigastric pain with no vomiting or bowel movements-reported that he was diagnosed with gastric ulcers. Reported that a month ago he had similar pain stated that he was treated with H. pylori triple therapy. Stated that he finished the antibiotics about a week ago. Denied spicy foods, fatty greasy foods, fried foods. Denied alcohol. Denied fever, neck pain, neck stiffness, dysuria, hematuria, fainting, weakness, headache, dizziness, visual distortions, travels, leg swelling, chest pain, abdominal pain. Denied NSAIDs.  PCP Dr. Daphine Deutscher  Past Medical History  Diagnosis Date  . Gout   . 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 Everlena Cooper).  MRI brain ordered.  . Migraines   . Chicken pox    Past Surgical History  Procedure Laterality Date  . Hernia repair  2006  . Wisdom tooth extraction      Family History  Problem Relation Age of Onset  . Diabetes Mother     Living  . Hypertension      family history  . Prostate cancer Father     family history  . Heart disease Paternal Uncle   . Heart disease Father   . Heart attack Father 56    Deceased  . Diabetes Sister     x3  . Cancer Other     Aunts & Uncles  . Cancer Other     Grandparents  . Heart attack Paternal Uncle   . Hyperlipidemia Mother   . Hyperlipidemia Father   . Hyperlipidemia Sister   . Diabetes Brother     x1  . Hyperlipidemia Brother     x1   History  Substance Use Topics  . Smoking status: Former Smoker -- 3 years    Quit date: 04/04/2012  . Smokeless tobacco: Never Used  . Alcohol Use: Yes     Comment: social    Review of Systems  Constitutional: Negative for fever and chills.  Respiratory: Positive for shortness of breath. Negative for chest tightness.   Cardiovascular: Negative for chest pain.  Gastrointestinal: Positive for nausea, vomiting, abdominal pain, diarrhea and blood in stool. Negative for constipation and anal bleeding.  Musculoskeletal: Negative for back pain, neck pain and neck stiffness.  Neurological: Negative for dizziness, weakness and headaches.      Allergies  Review of patient's allergies indicates no known allergies.  Home Medications   Prior to Admission medications   Medication Sig Start Date End Date Taking? Authorizing Provider  omeprazole (PRILOSEC)  20 MG capsule Take 1 capsule (20 mg total) by mouth 2 (two) times daily before a meal. 03/08/14  Yes Waldon Merl, PA-C  sucralfate (CARAFATE) 1 G tablet Take 1 tablet (1 g total) by mouth 4 (four) times daily -  with meals and at bedtime. 03/08/14  Yes Waldon Merl, PA-C  traMADol (ULTRAM) 50 MG tablet Take 1 tablet (50 mg total) by mouth every 8 (eight) hours as needed. 03/14/14  Yes Waldon Merl, PA-C  COLCRYS 0.6 MG tablet TAKE 1 TABLET (0.6 MG TOTAL) BY MOUTH 2 (TWO) TIMES DAILY AS NEEDED. Patient  not taking: Reported on 04/05/2014    Jeoffrey Massed, MD   BP 152/99 mmHg  Pulse 76  Temp(Src) 97.7 F (36.5 C) (Oral)  Resp 21  Ht  (1.702 m)  Wt 198 lb (89.812 kg)  BMI 31.00 kg/m2  SpO2 98% Physical Exam  Constitutional: He is oriented to person, place, and time. He appears well-developed and well-nourished. No distress.  HENT:  Head: Normocephalic and atraumatic.  Mouth/Throat: Oropharynx is clear and moist. No oropharyngeal exudate.  Eyes: Conjunctivae and EOM are normal. Pupils are equal, round, and reactive to light. Right eye exhibits no discharge. Left eye exhibits no discharge.  Neck: Normal range of motion. Neck supple. No tracheal deviation present.  Cardiovascular: Normal rate, regular rhythm and normal heart sounds.  Exam reveals no friction rub.   No murmur heard. Pulmonary/Chest: Effort normal and breath sounds normal. No respiratory distress. He has no wheezes. He has no rales.  Abdominal: Soft. Bowel sounds are normal. He exhibits no distension. There is tenderness in the epigastric area and left lower quadrant. There is no rebound and no guarding.  Negative abdominal distention Bowel sounds normal active in all 4 quadrants Abdomen soft upon palpation Generalized tenderness upon palpation to the abdomen with most discomfort to the epigastric region and left lower quadrant Negative peritoneal signs Negative guarding or rigidity noted on examination  Genitourinary:  Rectal exam: Negative lesions, sores, deformities, hemorrhoids. Negative bright red blood per rectum. Strong sphincter tone. Brown stools noted on glove with pinkish discoloration.  Exam chaperoned with tech, Ladona Ridgel  Musculoskeletal: Normal range of motion.  Lymphadenopathy:    He has no cervical adenopathy.  Neurological: He is alert and oriented to person, place, and time. No cranial nerve deficit. He exhibits normal muscle tone. Coordination normal.  Skin: Skin is warm and dry. No rash noted. He  is not diaphoretic. No erythema.  Psychiatric: He has a normal mood and affect. His behavior is normal. Thought content normal.  Nursing note and vitals reviewed.   ED Course  Procedures (including critical care time)  Results for orders placed or performed during the hospital encounter of 04/05/14  CBC with Differential/Platelet  Result Value Ref Range   WBC 10.2 4.0 - 10.5 K/uL   RBC 5.60 4.22 - 5.81 MIL/uL   Hemoglobin 17.5 (H) 13.0 - 17.0 g/dL   HCT 29.5 62.1 - 30.8 %   MCV 85.0 78.0 - 100.0 fL   MCH 31.3 26.0 - 34.0 pg   MCHC 36.8 (H) 30.0 - 36.0 g/dL   RDW 65.7 84.6 - 96.2 %   Platelets 179 150 - 400 K/uL   Neutrophils Relative % 88 (H) 43 - 77 %   Lymphocytes Relative 9 (L) 12 - 46 %   Monocytes Relative 3 3 - 12 %   Eosinophils Relative 0 0 - 5 %   Basophils Relative 0  0 - 1 %   Neutro Abs 9.0 (H) 1.7 - 7.7 K/uL   Lymphs Abs 0.9 0.7 - 4.0 K/uL   Monocytes Absolute 0.3 0.1 - 1.0 K/uL   Eosinophils Absolute 0.0 0.0 - 0.7 K/uL   Basophils Absolute 0.0 0.0 - 0.1 K/uL   Smear Review MORPHOLOGY UNREMARKABLE   Comprehensive metabolic panel  Result Value Ref Range   Sodium 136 135 - 145 mmol/L   Potassium 3.9 3.5 - 5.1 mmol/L   Chloride 99 96 - 112 mmol/L   CO2 29 19 - 32 mmol/L   Glucose, Bld 157 (H) 70 - 99 mg/dL   BUN 8 6 - 23 mg/dL   Creatinine, Ser 1.61 0.50 - 1.35 mg/dL   Calcium 9.1 8.4 - 09.6 mg/dL   Total Protein 8.0 6.0 - 8.3 g/dL   Albumin 4.7 3.5 - 5.2 g/dL   AST 37 0 - 37 U/L   ALT 35 0 - 53 U/L   Alkaline Phosphatase 79 39 - 117 U/L   Total Bilirubin 0.8 0.3 - 1.2 mg/dL   GFR calc non Af Amer 84 (L) >90 mL/min   GFR calc Af Amer >90 >90 mL/min   Anion gap 8 5 - 15  Troponin I  Result Value Ref Range   Troponin I <0.03 <0.031 ng/mL  Brain natriuretic peptide  Result Value Ref Range   B Natriuretic Peptide 12.3 0.0 - 100.0 pg/mL  Lipase, blood  Result Value Ref Range   Lipase 26 11 - 59 U/L  POC occult blood, ED  Result Value Ref Range   Fecal  Occult Bld POSITIVE (A) NEGATIVE    Labs Review Labs Reviewed  CBC WITH DIFFERENTIAL/PLATELET - Abnormal; Notable for the following:    Hemoglobin 17.5 (*)    MCHC 36.8 (*)    Neutrophils Relative % 88 (*)    Lymphocytes Relative 9 (*)    Neutro Abs 9.0 (*)    All other components within normal limits  COMPREHENSIVE METABOLIC PANEL - Abnormal; Notable for the following:    Glucose, Bld 157 (*)    GFR calc non Af Amer 84 (*)    All other components within normal limits  POC OCCULT BLOOD, ED - Abnormal; Notable for the following:    Fecal Occult Bld POSITIVE (*)    All other components within normal limits  TROPONIN I  BRAIN NATRIURETIC PEPTIDE  LIPASE, BLOOD  OCCULT BLOOD X 1 CARD TO LAB, STOOL  TYPE AND SCREEN    Imaging Review Dg Abd Acute W/chest  04/05/2014   CLINICAL DATA:  Central chest pain and sharp epigastric pain starting yesterday.  EXAM: ACUTE ABDOMEN SERIES (ABDOMEN 2 VIEW & CHEST 1 VIEW)  COMPARISON:  11/05/2012  FINDINGS: The abdomen is relatively gasless, which decreases the sensitivity of radiography for detecting obstruction. There is no indication of bowel obstruction or perforation. No concerning intra-abdominal mass effect or calcification.  Normal heart size and mediastinal contours. No acute infiltrate or edema. No effusion or pneumothorax. No acute osseous findings.  IMPRESSION: 1. Essentially gasless abdomen with no evidence of bowel obstruction or perforation. 2. Negative chest.   Electronically Signed   By: Tiburcio Pea M.D.   On: 04/05/2014 08:01     EKG Interpretation   Date/Time:  Tuesday April 05 2014 06:35:54 EST Ventricular Rate:  81 PR Interval:  138 QRS Duration: 85 QT Interval:  368 QTC Calculation: 427 R Axis:   53 Text Interpretation:  Sinus rhythm No significant  change was found  Confirmed by CAMPOS  MD, Caryn BeeKEVIN ([id number]) on 04/05/2014 7:19:01 AM       8:24 AM This provider spoke with Dr. Madilyn FiremanHayes, gastroenterology. Discussed case, labs,  vitals, ED course, imaging in great detail. As per physician, recommended patient to be admitted to hospital and that GI will consult.  8:37 AM This provider spoke with Dr. Jerral RalphGhimire, [facility]. Discussed case, labs, ED course, GI consult recommendations. Patient to be admitted to hospital, admitted to telemetry floor.  MDM   Final diagnoses:  Gastrointestinal hemorrhage, unspecified gastritis, unspecified gastrointestinal hemorrhage type  Esophagitis  Epigastric abdominal pain    Medications  sodium chloride 0.9 % bolus 1,000 mL (1,000 mLs Intravenous New Bag/Given 04/05/14 0658)  ondansetron (ZOFRAN) injection 4 mg (4 mg Intravenous Given 04/05/14 0658)  pantoprazole (PROTONIX) injection 40 mg (40 mg Intravenous Given 04/05/14 0735)   Filed Vitals:   04/05/14 0615 04/05/14 0630 04/05/14 0715 04/05/14 0730  BP: 161/99 144/83 148/94 152/99  Pulse: 82 75 88 76  Temp:      TempSrc:      Resp: 14 18 22 21   Height:      Weight:      SpO2: 100% 97% 99% 98%    EKG noted normal sinus rhythm with a heart rate of 80 bpm. Troponin negative elevation. BNP negative elevation-12.3. CBC noted-negative elevated leukocytosis. Hemoglobin 17.5, hematocrit 47.6. CMP noted glucose of 157 with negative elevated anion gap-8.0 mg/L. Lipase negative elevation. Fecal occult positive. Acute abdomen and chest plain film no evidence of bowel obstruction or perforation. Unremarkable chest. GI consulted, Dr. Madilyn FiremanHayes. As per physician recommended patient to be admitted to internal medicine and GI will follow and consult. [facility] consulted - to admit patient to Telemetry floor.  Patient presenting to the ED with hematochezia and bright red blood specks in emesis. Patient does have history of gastric ulcers and recently treated for H. pylori approximately one month ago. Symptoms appear to be worsening. GI consulted-patient most likely need endoscopy to be performed. Patient to be admitted to hospital to  telemetry for cardiac monitoring. Type and screen ordered and pending-hemoglobin 17.5 at this time. Discussed plan for mission with patient who agrees to plan of care. Patient stable for transfer.  Raymon MuttonMarissa , PA-C 04/05/14 [id number]  Rolland PorterMark James, MD 04/06/14 [phone]

## 2014-04-06 DIAGNOSIS — K219 Gastro-esophageal reflux disease without esophagitis: Secondary | ICD-10-CM

## 2014-04-06 DIAGNOSIS — K8020 Calculus of gallbladder without cholecystitis without obstruction: Secondary | ICD-10-CM | POA: Diagnosis present

## 2014-04-06 DIAGNOSIS — K529 Noninfective gastroenteritis and colitis, unspecified: Secondary | ICD-10-CM

## 2014-04-06 DIAGNOSIS — R100 Acute abdomen: Secondary | ICD-10-CM

## 2014-04-06 LAB — BASIC METABOLIC PANEL
Anion gap: 7 (ref 5–15)
BUN: 7 mg/dL (ref 6–23)
CO2: 27 mmol/L (ref 19–32)
CREATININE: 0.89 mg/dL (ref 0.50–1.35)
Calcium: 7.8 mg/dL — ABNORMAL LOW (ref 8.4–10.5)
Chloride: 101 mmol/L (ref 96–112)
GFR calc Af Amer: >90 mL/min (ref >90)
GFR calc non Af Amer: >90 mL/min (ref >90)
Glucose, Bld: 115 mg/dL — ABNORMAL HIGH (ref 70–99)
POTASSIUM: 3.6 mmol/L (ref 3.5–5.1)
SODIUM: 135 mmol/L (ref 135–145)

## 2014-04-06 LAB — DRUGS OF ABUSE SCREEN W/O ALC, ROUTINE URINE
AMPHETAMINE SCRN UR: NEGATIVE
Barbiturate Quant, Ur: NEGATIVE
Benzodiazepines.: NEGATIVE
Cocaine Metabolites: NEGATIVE
Creatinine,U: 145.2 mg/dL
MARIJUANA METABOLITE: NEGATIVE
Methadone: NEGATIVE
Opiate Screen, Urine: NEGATIVE
Phencyclidine (PCP): NEGATIVE
Propoxyphene: NEGATIVE

## 2014-04-06 LAB — CBC
HEMATOCRIT: 43.2 % (ref 39.0–52.0)
Hemoglobin: 15.6 g/dL (ref 13.0–17.0)
MCH: 31 pg (ref 26.0–34.0)
MCHC: 36.1 g/dL — ABNORMAL HIGH (ref 30.0–36.0)
MCV: 85.7 fL (ref 78.0–100.0)
Platelets: 138 10*3/uL — ABNORMAL LOW (ref 150–400)
RBC: 5.04 MIL/uL (ref 4.22–5.81)
RDW: 12.9 % (ref 11.5–15.5)
WBC: 12.6 10*3/uL — ABNORMAL HIGH (ref 4.0–10.5)

## 2014-04-06 LAB — RAPID URINE DRUG SCREEN, HOSP PERFORMED
AMPHETAMINES: NOT DETECTED
Barbiturates: NOT DETECTED
Benzodiazepines: NOT DETECTED
COCAINE: NOT DETECTED
OPIATES: POSITIVE — AB
TETRAHYDROCANNABINOL: POSITIVE — AB

## 2014-04-06 NOTE — Progress Notes (Signed)
[facility] PROGRESS NOTE   Anthony Gill JXB:[id number] DOB: March 22, 1970 DOA: 04/05/2014 PCP: Piedad Climes, PA-C  HPI/Subjective: Feels slightly better since yesterday, no nausea but still has some abdominal discomfort. Still has some diarrhea but no nausea or vomiting.  Assessment/Plan: Principal Problem:   Acute colitis Active Problems:   GERD (gastroesophageal reflux disease)   Acute abdominal pain   Cholelithiasis    Acute colitis Presumed infectious, has low-grade fever of 99.3. Started on Cipro and Flagyl. C. difficile PCR is negative, rest of GI pathogen panel is pending. Was nothing by mouth for bowel rest, started on clear liquids. Follow clinically.  Cholelithiasis RUQ ultrasound showed nearly full gallbladder with small stones. Cannot rule out chronic cholecystitis. GI recommended nonemergent surgical consultation, will wait for the colitis to resolve. Likely can be referred as outpatient.  History of PUD Patient positive for H. pylori, received treatment recently. Continue PPI.  Acute abdominal pain Secondary to acute colitis cannot rule out cholelithiasis as a cause. Improving.  Code Status: Full Code Family Communication: Plan discussed with the patient. Disposition Plan: Remains inpatient Diet: Diet clear liquid  Consultants:  GI  Procedures:  None  Antibiotics:  None   Objective: Filed Vitals:   04/06/14 1350  BP: 147/93  Pulse: 96  Temp: 99.3 F (37.4 C)  Resp: 20    Intake/Output Summary (Last 24 hours) at 04/06/14 1604 Last data filed at 04/06/14 1349  Gross per 24 hour  Intake   1143 ml  Output      0 ml  Net   1143 ml   Filed Weights   04/05/14 0610  Weight: 89.812 kg (198 lb)    Exam: General: Alert and awake, oriented x3, not in any acute distress. HEENT: anicteric sclera, pupils reactive to light and accommodation, EOMI CVS: S1-S2 clear, no murmur rubs or gallops Chest: clear to auscultation  bilaterally, no wheezing, rales or rhonchi Abdomen: soft nontender, nondistended, normal bowel sounds, no organomegaly Extremities: no cyanosis, clubbing or edema noted bilaterally Neuro: Cranial nerves II-XII intact, no focal neurological deficits  Data Reviewed: Basic Metabolic Panel:  Recent Labs Lab 04/05/14 0615 04/06/14 0722  NA 136 135  K 3.9 3.6  CL 99 101  CO2 29 27  GLUCOSE 157* 115*  BUN 8 7  CREATININE 1.08 0.89  CALCIUM 9.1 7.8*   Liver Function Tests:  Recent Labs Lab 04/05/14 0615  AST 37  ALT 35  ALKPHOS 79  BILITOT 0.8  PROT 8.0  ALBUMIN 4.7    Recent Labs Lab 04/05/14 0615  LIPASE 26   No results for input(s): AMMONIA in the last 168 hours. CBC:  Recent Labs Lab 04/05/14 0615 04/06/14 0722  WBC 10.2 12.6*  NEUTROABS 9.0*  --   HGB 17.5* 15.6  HCT 47.6 43.2  MCV 85.0 85.7  PLT 179 138*   Cardiac Enzymes:  Recent Labs Lab 04/05/14 0615  TROPONINI <0.03   BNP (last 3 results)  Recent Labs  04/05/14 0615  BNP 12.3    ProBNP (last 3 results) No results for input(s): PROBNP in the last 8760 hours.  CBG: No results for input(s): GLUCAP in the last 168 hours.  Micro Recent Results (from the past 240 hour(s))  Clostridium Difficile by PCR     Status: None   Collection Time: 04/05/14 10:52 AM  Result Value Ref Range Status   C difficile by pcr NEGATIVE NEGATIVE Final     Studies: US Abdomen Complete  04/05/2014  CLINICAL DATA:  Gallstones, colitis, abdominal pain  EXAM: ULTRASOUND ABDOMEN COMPLETE  COMPARISON:  CT same date  FINDINGS: Gallbladder: Multiple gallstones are identified nearly filling the gallbladder, largest 2.3 cm. Gallbladder wall thickness measures 3 mm maximally. No pericholecystic fluid or sonographic Eulah PontMurphy sign is identified.  Common bile duct: Diameter: 3 mm  Liver: Inhomogeneous, no focal abnormality or intrahepatic ductal dilatation  IVC: No abnormality visualized.  Pancreas: Visualized portion  unremarkable.  Spleen: Size and appearance within normal limits.  Right Kidney: Length: 10.5 cm. Echogenicity within normal limits. No mass or hydronephrosis visualized.  Left Kidney: Length: 10.6 cm. Echogenicity within normal limits. No mass or hydronephrosis visualized.  Abdominal aorta: No aneurysm visualized.  Other findings: None.  IMPRESSION: Nearly stone filled gallbladder with wall thickness at upper limits of normal which could indicate an element of chronic cholecystitis but no pericholecystic fluid or sonographic Eulah PontMurphy sign is identified.   Electronically Signed   By: Christiana PellantGretchen  Green M.D.   On: 04/05/2014 19:43   Ct Abdomen Pelvis W Contrast  04/05/2014   CLINICAL DATA:  Generalized abdominal pain.  EXAM: CT ABDOMEN AND PELVIS WITH CONTRAST  TECHNIQUE: Multidetector CT imaging of the abdomen and pelvis was performed using the standard protocol following bolus administration of intravenous contrast. Oral contrast was also administered.  CONTRAST:  100mL OMNIPAQUE IOHEXOL 300 MG/ML  SOLN  COMPARISON:  None.  FINDINGS: There is mild bibasilar lung atelectasis. There is mild bibasilar lung bronchiectatic change.  Liver is prominent, measuring 19.8 cm in length. No focal liver lesions are identified. There is cholelithiasis. There is slight gallbladder wall thickening. No pericholecystic fluid is seen. There is no appreciable biliary duct dilatation.  Spleen, pancreas, and adrenals appear normal. Kidneys bilaterally show no hydronephrosis on either side. There is a cyst in the mid right kidney measuring 1.2 x 0.5 cm. No other renal masses are identified. There is no renal or ureteral calculus on either side.  In the pelvis, the urinary bladder is decompressed. There is no pelvic mass or pelvic fluid collection. The rectum is filled with fluid but not appreciably distended. Appendix appears normal. There is fat in the right inguinal ring.  There is a rather minimal ventral hernia containing only fat.  There  is thickening of the wall of the colon from the cecum to the upper sigmoid colon. There is surrounding mesenteric thickening throughout much of this region. There is no abscess or evidence suggesting perforation. There is some mildly dilated small bowel near the area of thickening in the colonic inflammation in the upper pelvis just to the left of midline. There is no other small bowel wall thickening. There is no bowel obstruction. No free air or portal venous air. Terminal ileum appears normal.  There is no ascites, adenopathy, or abscess in the abdomen or pelvis. There is no abdominal aortic aneurysm. There are no blastic or lytic bone lesions.  IMPRESSION: Extensive colitis with sparing of most of the sigmoid colon. Suspect infectious or inflammatory etiology is most likely given this overall appearance. Sparing of the terminal ileum and most of the sigmoid colon makes inflammatory bowel disease a less likely differential consideration.  There are gallstones with slight gallbladder wall thickening. A degree of cholecystitis must be of concern given this appearance. This finding may warrant ultrasound of the gallbladder to further assess.  Prominent liver without focal lesions.  No renal or ureteral calculus.  No hydronephrosis.  No bowel obstruction. No abscess. Appendix appears normal. Minimal ventral hernia  containing only fat.   Electronically Signed   By: Bretta Bang M.D.   On: 04/05/2014 13:19   Dg Abd Acute W/chest  04/05/2014   CLINICAL DATA:  Central chest pain and sharp epigastric pain starting yesterday.  EXAM: ACUTE ABDOMEN SERIES (ABDOMEN 2 VIEW & CHEST 1 VIEW)  COMPARISON:  11/05/2012  FINDINGS: The abdomen is relatively gasless, which decreases the sensitivity of radiography for detecting obstruction. There is no indication of bowel obstruction or perforation. No concerning intra-abdominal mass effect or calcification.  Normal heart size and mediastinal contours. No acute infiltrate or  edema. No effusion or pneumothorax. No acute osseous findings.  IMPRESSION: 1. Essentially gasless abdomen with no evidence of bowel obstruction or perforation. 2. Negative chest.   Electronically Signed   By: Tiburcio Pea M.D.   On: 04/05/2014 08:01    Scheduled Meds: . ciprofloxacin  400 mg Intravenous Q12H  . metronidazole  500 mg Intravenous Q12H  . pantoprazole (PROTONIX) IV  40 mg Intravenous Q12H  . sodium chloride  3 mL Intravenous Q12H   Continuous Infusions: . sodium chloride 150 mL/hr at 04/06/14 0544       Time spent: 35 minutes    [facility] A  [facility] Pager [phone] If 7PM-7AM, please contact night-coverage at [email or web address], password [facility] 04/06/2014, 4:04 PM  LOS: 1 day

## 2014-04-06 NOTE — Progress Notes (Signed)
[facility] Progress Note  Subjective: Abdominal pain improved with pain medicine. No further vomiting. Still seeing some small volume hematochezia  Objective: Vital signs in last 24 hours: Temp:  [98.4 F (36.9 C)-98.9 F (37.2 C)] 98.7 F (37.1 C) (02/03 0533) Pulse Rate:  [78-88] 88 (02/03 0533) Resp:  [16-21] 18 (02/03 0533) BP: (141-151)/(86-95) 141/86 mmHg (02/03 0533) SpO2:  [95 %-99 %] 95 % (02/03 0533) Weight change:    PE: Abdomen soft diffuse mild to moderate tenderness  Lab Results: Results for orders placed or performed during the hospital encounter of 04/05/14 (from the past 24 hour(s))  Clostridium Difficile by PCR     Status: None   Collection Time: 04/05/14 10:52 AM  Result Value Ref Range   C difficile by pcr NEGATIVE NEGATIVE  Urine rapid drug screen (hosp performed)     Status: Abnormal   Collection Time: 04/05/14  6:57 PM  Result Value Ref Range   Opiates POSITIVE (A) NONE DETECTED   Cocaine NONE DETECTED NONE DETECTED   Benzodiazepines NONE DETECTED NONE DETECTED   Amphetamines NONE DETECTED NONE DETECTED   Tetrahydrocannabinol POSITIVE (A) NONE DETECTED   Barbiturates NONE DETECTED NONE DETECTED  Drugs of abuse screen w/o alc, rtn urine-sln     Status: None   Collection Time: 04/05/14  6:57 PM  Result Value Ref Range   Marijuana Metabolite NEGATIVE Negative   Amphetamine Screen, Ur NEGATIVE Negative   Barbiturate Quant, Ur NEGATIVE Negative   Methadone NEGATIVE Negative   Benzodiazepines. NEGATIVE Negative   Phencyclidine (PCP) NEGATIVE Negative   Cocaine Metabolites NEGATIVE Negative   Opiate Screen, Urine NEGATIVE Negative   Propoxyphene NEGATIVE Negative   Creatinine,U 145.2 mg/dL  Basic metabolic panel     Status: Abnormal   Collection Time: 04/06/14  7:22 AM  Result Value Ref Range   Sodium 135 135 - 145 mmol/L   Potassium 3.6 3.5 - 5.1 mmol/L   Chloride 101 96 - 112 mmol/L   CO2 27 19 - 32 mmol/L   Glucose, Bld 115 (H) 70  - 99 mg/dL   BUN 7 6 - 23 mg/dL   Creatinine, Ser 1.61 0.50 - 1.35 mg/dL   Calcium 7.8 (L) 8.4 - 10.5 mg/dL   GFR calc non Af Amer >90 >90 mL/min   GFR calc Af Amer >90 >90 mL/min   Anion gap 7 5 - 15  CBC     Status: Abnormal   Collection Time: 04/06/14  7:22 AM  Result Value Ref Range   WBC 12.6 (H) 4.0 - 10.5 K/uL   RBC 5.04 4.22 - 5.81 MIL/uL   Hemoglobin 15.6 13.0 - 17.0 g/dL   HCT 09.6 04.5 - 40.9 %   MCV 85.7 78.0 - 100.0 fL   MCH 31.0 26.0 - 34.0 pg   MCHC 36.1 (H) 30.0 - 36.0 g/dL   RDW 81.1 91.4 - 78.2 %   Platelets 138 (L) 150 - 400 K/uL    Studies/Results: US Abdomen Complete  04/05/2014   CLINICAL DATA:  Gallstones, colitis, abdominal pain  EXAM: ULTRASOUND ABDOMEN COMPLETE  COMPARISON:  CT same date  FINDINGS: Gallbladder: Multiple gallstones are identified nearly filling the gallbladder, largest 2.3 cm. Gallbladder wall thickness measures 3 mm maximally. No pericholecystic fluid or sonographic Eulah Pont sign is identified.  Common bile duct: Diameter: 3 mm  Liver: Inhomogeneous, no focal abnormality or intrahepatic ductal dilatation  IVC: No abnormality visualized.  Pancreas: Visualized portion unremarkable.  Spleen: Size and appearance within normal limits.  Right Kidney: Length: 10.5 cm. Echogenicity within normal limits. No mass or hydronephrosis visualized.  Left Kidney: Length: 10.6 cm. Echogenicity within normal limits. No mass or hydronephrosis visualized.  Abdominal aorta: No aneurysm visualized.  Other findings: None.  IMPRESSION: Nearly stone filled gallbladder with wall thickness at upper limits of normal which could indicate an element of chronic cholecystitis but no pericholecystic fluid or sonographic Eulah Pont sign is identified.   Electronically Signed   By: Christiana Pellant M.D.   On: 04/05/2014 19:43   Ct Abdomen Pelvis W Contrast  04/05/2014   CLINICAL DATA:  Generalized abdominal pain.  EXAM: CT ABDOMEN AND PELVIS WITH CONTRAST  TECHNIQUE: Multidetector CT imaging  of the abdomen and pelvis was performed using the standard protocol following bolus administration of intravenous contrast. Oral contrast was also administered.  CONTRAST:  OMNIPAQUE IOHEXOL 300 MG/ML  SOLN  COMPARISON:  None.  FINDINGS: There is mild bibasilar lung atelectasis. There is mild bibasilar lung bronchiectatic change.  Liver is prominent, measuring 19.8 cm in length. No focal liver lesions are identified. There is cholelithiasis. There is slight gallbladder wall thickening. No pericholecystic fluid is seen. There is no appreciable biliary duct dilatation.  Spleen, pancreas, and adrenals appear normal. Kidneys bilaterally show no hydronephrosis on either side. There is a cyst in the mid right kidney measuring 1.2 x 0.5 cm. No other renal masses are identified. There is no renal or ureteral calculus on either side.  In the pelvis, the urinary bladder is decompressed. There is no pelvic mass or pelvic fluid collection. The rectum is filled with fluid but not appreciably distended. Appendix appears normal. There is fat in the right inguinal ring.  There is a rather minimal ventral hernia containing only fat.  There is thickening of the wall of the colon from the cecum to the upper sigmoid colon. There is surrounding mesenteric thickening throughout much of this region. There is no abscess or evidence suggesting perforation. There is some mildly dilated small bowel near the area of thickening in the colonic inflammation in the upper pelvis just to the left of midline. There is no other small bowel wall thickening. There is no bowel obstruction. No free air or portal venous air. Terminal ileum appears normal.  There is no ascites, adenopathy, or abscess in the abdomen or pelvis. There is no abdominal aortic aneurysm. There are no blastic or lytic bone lesions.  IMPRESSION: Extensive colitis with sparing of most of the sigmoid colon. Suspect infectious or inflammatory etiology is most likely given this  overall appearance. Sparing of the terminal ileum and most of the sigmoid colon makes inflammatory bowel disease a less likely differential consideration.  There are gallstones with slight gallbladder wall thickening. A degree of cholecystitis must be of concern given this appearance. This finding may warrant ultrasound of the gallbladder to further assess.  Prominent liver without focal lesions.  No renal or ureteral calculus.  No hydronephrosis.  No bowel obstruction. No abscess. Appendix appears normal. Minimal ventral hernia containing only fat.   Electronically Signed   By: Bretta Bang M.D.   On: 04/05/2014 13:19   Dg Abd Acute W/chest  04/05/2014   CLINICAL DATA:  Central chest pain and sharp epigastric pain starting yesterday.  EXAM: ACUTE ABDOMEN SERIES (ABDOMEN 2 VIEW & CHEST 1 VIEW)  COMPARISON:  11/05/2012  FINDINGS: The abdomen is relatively gasless, which decreases the sensitivity of radiography for detecting obstruction. There is no indication of bowel obstruction or perforation. No concerning  intra-abdominal mass effect or calcification.  Normal heart size and mediastinal contours. No acute infiltrate or edema. No effusion or pneumothorax. No acute osseous findings.  IMPRESSION: 1. Essentially gasless abdomen with no evidence of bowel obstruction or perforation. 2. Negative chest.   Electronically Signed   By: Tiburcio PeaJonathan  Watts M.D.   On: 04/05/2014 08:01      Assessment: Acute colitis, suspect infectious source Cholelithiasis strongly suspect contributing to dyspeptic symptoms that were treated as H. pylori for the last month  Plan: Continue Cipro and Flagyl Await stool studies When more stable from colitis standpoint will consult general surgery for eventual cholecystectomy.    , C 04/06/2014, 9:08 AM

## 2014-04-07 LAB — COMPREHENSIVE METABOLIC PANEL
ALT: 30 U/L (ref 0–53)
AST: 22 U/L (ref 0–37)
Albumin: 3.1 g/dL — ABNORMAL LOW (ref 3.5–5.2)
Alkaline Phosphatase: 63 U/L (ref 39–117)
Anion gap: 9 (ref 5–15)
BILIRUBIN TOTAL: 0.9 mg/dL (ref 0.3–1.2)
BUN: 6 mg/dL (ref 6–23)
CO2: 24 mmol/L (ref 19–32)
Calcium: 8.2 mg/dL — ABNORMAL LOW (ref 8.4–10.5)
Chloride: 105 mmol/L (ref 96–112)
Creatinine, Ser: 0.95 mg/dL (ref 0.50–1.35)
GFR calc Af Amer: >90 mL/min (ref >90)
GFR calc non Af Amer: >90 mL/min (ref >90)
Glucose, Bld: 102 mg/dL — ABNORMAL HIGH (ref 70–99)
Potassium: 3.5 mmol/L (ref 3.5–5.1)
Sodium: 138 mmol/L (ref 135–145)
TOTAL PROTEIN: 5.9 g/dL — AB (ref 6.0–8.3)

## 2014-04-07 LAB — CBC
HEMATOCRIT: 40.4 % (ref 39.0–52.0)
Hemoglobin: 14.2 g/dL (ref 13.0–17.0)
MCH: 29.8 pg (ref 26.0–34.0)
MCHC: 35.1 g/dL (ref 30.0–36.0)
MCV: 84.9 fL (ref 78.0–100.0)
Platelets: 138 10*3/uL — ABNORMAL LOW (ref 150–400)
RBC: 4.76 MIL/uL (ref 4.22–5.81)
RDW: 12.6 % (ref 11.5–15.5)
WBC: 10.4 10*3/uL (ref 4.0–10.5)

## 2014-04-07 LAB — GI PATHOGEN PANEL BY PCR, STOOL
C DIFFICILE TOXIN A/B: NOT DETECTED
CAMPYLOBACTER BY PCR: NOT DETECTED
CRYPTOSPORIDIUM BY PCR: NOT DETECTED
E COLI (STEC): NOT DETECTED
E COLI 0157 BY PCR: NOT DETECTED
E coli (ETEC) LT/ST: NOT DETECTED
G lamblia by PCR: NOT DETECTED
Norovirus GI/GII: NOT DETECTED
ROTAVIRUS A BY PCR: NOT DETECTED
SALMONELLA BY PCR: NOT DETECTED
Shigella by PCR: NOT DETECTED

## 2014-04-07 MED ORDER — POTASSIUM CHLORIDE CRYS ER 20 MEQ PO TBCR
60.0000 meq | EXTENDED_RELEASE_TABLET | Freq: Once | ORAL | Status: AC
  Administered 2014-04-07: 60 meq via ORAL
  Filled 2014-04-07: qty 3

## 2014-04-07 NOTE — Progress Notes (Signed)
[facility] PROGRESS NOTE   Anthony Gill AVW:[id number] DOB: 1971-02-11 DOA: 04/05/2014 PCP: Piedad Climes, PA-C  HPI/Subjective: Feels much better than yesterday, denies nausea, vomiting or abdominal pain. Tolerated clear liquids very okay. I will advance to full liquids if okay with GI.  Assessment/Plan: Principal Problem:   Acute colitis Active Problems:   GERD (gastroesophageal reflux disease)   Acute abdominal pain   Cholelithiasis    Acute colitis Presumed infectious, has low-grade fever of 99.3. Started on Cipro and Flagyl. C. difficile PCR is negative, rest of GI pathogen panel is pending. Was nothing by mouth for bowel rest, started on clear liquids, tolerated very well. Advanced to full liquids., Continue current antibiotic therapy  Cholelithiasis RUQ ultrasound showed nearly full gallbladder with small stones. Cannot rule out chronic cholecystitis. But no evidence of acute cholecystitis. GI recommended nonemergent surgical consultation, will wait for the colitis to resolve. Likely can be referred as outpatient.  History of PUD Patient positive for H. pylori, received treatment recently. Continue PPI.  Acute abdominal pain Secondary to acute colitis cannot rule out cholelithiasis as a cause. Abdominal pain resolved.  Code Status: Full Code Family Communication: Plan discussed with the patient. Disposition Plan: Remains inpatient Diet: Diet clear liquid  Consultants:  GI  Procedures:  None  Antibiotics:  None   Objective: Filed Vitals:   04/07/14 0630  BP: 134/83  Pulse: 79  Temp: 99.5 F (37.5 C)  Resp: 16    Intake/Output Summary (Last 24 hours) at 04/07/14 1134 Last data filed at 04/07/14 0800  Gross per 24 hour  Intake    340 ml  Output    650 ml  Net   -310 ml   Filed Weights   04/05/14 0610  Weight: 89.812 kg (198 lb)    Exam: General: Alert and awake, oriented x3, not in any acute distress. HEENT:  anicteric sclera, pupils reactive to light and accommodation, EOMI CVS: S1-S2 clear, no murmur rubs or gallops Chest: clear to auscultation bilaterally, no wheezing, rales or rhonchi Abdomen: soft nontender, nondistended, normal bowel sounds, no organomegaly Extremities: no cyanosis, clubbing or edema noted bilaterally Neuro: Cranial nerves II-XII intact, no focal neurological deficits  Data Reviewed: Basic Metabolic Panel:  Recent Labs Lab 04/05/14 0615 04/06/14 0722 04/07/14 0650  NA 136 135 138  K 3.9 3.6 3.5  CL 99 101 105  CO2 GLUCOSE 157* 115* 102*  BUN CREATININE 1.08 0.89 0.95  CALCIUM 9.1 7.8* 8.2*   Liver Function Tests:  Recent Labs Lab 04/05/14 0615 04/07/14 0650  AST 37 22  ALT 35 30  ALKPHOS 79 63  BILITOT 0.8 0.9  PROT 8.0 5.9*  ALBUMIN 4.7 3.1*    Recent Labs Lab 04/05/14 0615  LIPASE 26   No results for input(s): AMMONIA in the last 168 hours. CBC:  Recent Labs Lab 04/05/14 0615 04/06/14 0722 04/07/14 0650  WBC 10.2 12.6* 10.4  NEUTROABS 9.0*  --   --   HGB 17.5* 15.6 14.2  HCT 47.6 43.2 40.4  MCV 85.0 85.7 84.9  PLT 179 138* 138*   Cardiac Enzymes:  Recent Labs Lab 04/05/14 0615  TROPONINI <0.03   BNP (last 3 results)  Recent Labs  04/05/14 0615  BNP 12.3    ProBNP (last 3 results) No results for input(s): PROBNP in the last 8760 hours.  CBG: No results for input(s): GLUCAP in the last 168 hours.  Micro Recent Results (from the  past 240 hour(s))  Clostridium Difficile by PCR     Status: None   Collection Time: 04/05/14 10:52 AM  Result Value Ref Range Status   C difficile by pcr NEGATIVE NEGATIVE Final     Studies: [place] Abdomen Complete  04/05/2014   CLINICAL DATA:  Gallstones, colitis, abdominal pain  EXAM: ULTRASOUND ABDOMEN COMPLETE  COMPARISON:  CT same date  FINDINGS: Gallbladder: Multiple gallstones are identified nearly filling the gallbladder, largest 2.3 cm. Gallbladder wall thickness  measures 3 mm maximally. No pericholecystic fluid or sonographic Eulah PontMurphy sign is identified.  Common bile duct: Diameter: 3 mm  Liver: Inhomogeneous, no focal abnormality or intrahepatic ductal dilatation  IVC: No abnormality visualized.  Pancreas: Visualized portion unremarkable.  Spleen: Size and appearance within normal limits.  Right Kidney: Length: 10.5 cm. Echogenicity within normal limits. No mass or hydronephrosis visualized.  Left Kidney: Length: 10.6 cm. Echogenicity within normal limits. No mass or hydronephrosis visualized.  Abdominal aorta: No aneurysm visualized.  Other findings: None.  IMPRESSION: Nearly stone filled gallbladder with wall thickness at upper limits of normal which could indicate an element of chronic cholecystitis but no pericholecystic fluid or sonographic Eulah PontMurphy sign is identified.   Electronically Signed   By: Christiana PellantGretchen  Green M.D.   On: 04/05/2014 19:43   Ct Abdomen Pelvis W Contrast  04/05/2014   CLINICAL DATA:  Generalized abdominal pain.  EXAM: CT ABDOMEN AND PELVIS WITH CONTRAST  TECHNIQUE: Multidetector CT imaging of the abdomen and pelvis was performed using the standard protocol following bolus administration of intravenous contrast. Oral contrast was also administered.  CONTRAST:  100mL OMNIPAQUE IOHEXOL 300 MG/ML  SOLN  COMPARISON:  None.  FINDINGS: There is mild bibasilar lung atelectasis. There is mild bibasilar lung bronchiectatic change.  Liver is prominent, measuring 19.8 cm in length. No focal liver lesions are identified. There is cholelithiasis. There is slight gallbladder wall thickening. No pericholecystic fluid is seen. There is no appreciable biliary duct dilatation.  Spleen, pancreas, and adrenals appear normal. Kidneys bilaterally show no hydronephrosis on either side. There is a cyst in the mid right kidney measuring 1.2 x 0.5 cm. No other renal masses are identified. There is no renal or ureteral calculus on either side.  In the pelvis, the urinary bladder  is decompressed. There is no pelvic mass or pelvic fluid collection. The rectum is filled with fluid but not appreciably distended. Appendix appears normal. There is fat in the right inguinal ring.  There is a rather minimal ventral hernia containing only fat.  There is thickening of the wall of the colon from the cecum to the upper sigmoid colon. There is surrounding mesenteric thickening throughout much of this region. There is no abscess or evidence suggesting perforation. There is some mildly dilated small bowel near the area of thickening in the colonic inflammation in the upper pelvis just to the left of midline. There is no other small bowel wall thickening. There is no bowel obstruction. No free air or portal venous air. Terminal ileum appears normal.  There is no ascites, adenopathy, or abscess in the abdomen or pelvis. There is no abdominal aortic aneurysm. There are no blastic or lytic bone lesions.  IMPRESSION: Extensive colitis with sparing of most of the sigmoid colon. Suspect infectious or inflammatory etiology is most likely given this overall appearance. Sparing of the terminal ileum and most of the sigmoid colon makes inflammatory bowel disease a less likely differential consideration.  There are gallstones with slight gallbladder wall thickening. A  degree of cholecystitis must be of concern given this appearance. This finding may warrant ultrasound of the gallbladder to further assess.  Prominent liver without focal lesions.  No renal or ureteral calculus.  No hydronephrosis.  No bowel obstruction. No abscess. Appendix appears normal. Minimal ventral hernia containing only fat.   Electronically Signed   By: Bretta Bang M.D.   On: 04/05/2014 13:19    Scheduled Meds: . ciprofloxacin  400 mg Intravenous Q12H  . metronidazole  500 mg Intravenous Q12H  . pantoprazole (PROTONIX) IV  40 mg Intravenous Q12H  . sodium chloride  3 mL Intravenous Q12H   Continuous Infusions: . sodium chloride  100 mL/hr at 04/07/14 1001       Time spent: 35 minutes    [facility] A  [facility] Pager [phone] If 7PM-7AM, please contact night-coverage at [email or web address], password [facility] 04/07/2014, 11:34 AM  LOS: 2 days

## 2014-04-08 LAB — BASIC METABOLIC PANEL
ANION GAP: 7 (ref 5–15)
BUN: <5 mg/dL — ABNORMAL LOW (ref 6–23)
CO2: 27 mmol/L (ref 19–32)
Calcium: 8.4 mg/dL (ref 8.4–10.5)
Chloride: 104 mmol/L (ref 96–112)
Creatinine, Ser: 1.02 mg/dL (ref 0.50–1.35)
GFR calc Af Amer: >90 mL/min (ref >90)
GFR, EST NON AFRICAN AMERICAN: 90 mL/min — AB (ref >90)
Glucose, Bld: 147 mg/dL — ABNORMAL HIGH (ref 70–99)
Potassium: 3.2 mmol/L — ABNORMAL LOW (ref 3.5–5.1)
Sodium: 138 mmol/L (ref 135–145)

## 2014-04-08 LAB — CBC
HCT: 39.3 % (ref 39.0–52.0)
HEMOGLOBIN: 13.8 g/dL (ref 13.0–17.0)
MCH: 29.9 pg (ref 26.0–34.0)
MCHC: 35.1 g/dL (ref 30.0–36.0)
MCV: 85.1 fL (ref 78.0–100.0)
Platelets: 147 10*3/uL — ABNORMAL LOW (ref 150–400)
RBC: 4.62 MIL/uL (ref 4.22–5.81)
RDW: 12.7 % (ref 11.5–15.5)
WBC: 8.3 10*3/uL (ref 4.0–10.5)

## 2014-04-08 MED ORDER — POTASSIUM CHLORIDE CRYS ER 20 MEQ PO TBCR
60.0000 meq | EXTENDED_RELEASE_TABLET | Freq: Once | ORAL | Status: AC
  Administered 2014-04-08: 60 meq via ORAL
  Filled 2014-04-08: qty 3

## 2014-04-08 NOTE — Progress Notes (Signed)
[facility] Progress Note  Subjective:   Patient with improved abdominal pain but still present requiring pain medicine also with diarrhea which is improved and is becoming less bloody.  Objective: Vital signs in last 24 hours: Temp:  [98 F (36.7 C)-98.8 F (37.1 C)] 98 F (36.7 C) (02/05 0554) Pulse Rate:  [82-89] 82 (02/05 0554) Resp:  [13-20] 13 (02/05 0554) BP: (113-152)/(66-91) 125/68 mmHg (02/05 0554) SpO2:  [97 %-99 %] 99 % (02/05 0554) Weight change:    PE: Abdomen soft moderately tender in the epigastrium as well as the left lower quadrant  Lab Results: Results for orders placed or performed during the hospital encounter of 04/05/14 (from the past 24 hour(s))  Basic metabolic panel     Status: Abnormal   Collection Time: 04/08/14  6:15 AM  Result Value Ref Range   Sodium 138 135 - 145 mmol/L   Potassium 3.2 (L) 3.5 - 5.1 mmol/L   Chloride 104 96 - 112 mmol/L   CO2 27 19 - 32 mmol/L   Glucose, Bld 147 (H) 70 - 99 mg/dL   BUN <5 (L) 6 - 23 mg/dL   Creatinine, Ser 5.621.02 0.50 - 1.35 mg/dL   Calcium 8.4 8.4 - 13.010.5 mg/dL   GFR calc non Af Amer 90 (L) >90 mL/min   GFR calc Af Amer >90 >90 mL/min   Anion gap 7 5 - 15  CBC     Status: Abnormal   Collection Time: 04/08/14  6:15 AM  Result Value Ref Range   WBC 8.3 4.0 - 10.5 K/uL   RBC 4.62 4.22 - 5.81 MIL/uL   Hemoglobin 13.8 13.0 - 17.0 g/dL   HCT 86.539.3 78.439.0 - 69.652.0 %   MCV 85.1 78.0 - 100.0 fL   MCH 29.9 26.0 - 34.0 pg   MCHC 35.1 30.0 - 36.0 g/dL   RDW 29.512.7 28.411.5 - 13.215.5 %   Platelets 147 (L) 150 - 400 K/uL    Studies/Results: No results found.    Assessment: Acute colitis, presumed infectious, no pathogens were found but seems to be improving on antibiotics Cholelithiasis and very likely symptomatic and preceding onset of acute colitis symptoms  Plan: Would have Gen. surgery see patient for eventual cholecystectomy. He did not tolerate grits this morning and will keep him on a clear liquid  diet. Continue Cipro and Flagyl which could probably be changed to oral route    , C 04/08/2014, 10:45 AM

## 2014-04-08 NOTE — Progress Notes (Signed)
[facility] PROGRESS NOTE   Anthony Gill ZOX:[id number] DOB: 02-01-1971 DOA: 04/05/2014 PCP: Piedad ClimesMartin, William Cody, PA-C  HPI/Subjective: Seen with wife at bedside this morning, questions answered. Feeling much better, less nausea, less diarrhea with less blood. GI recommended general surgery consultation for cholelithiasis evaluation. Did have some nausea with grits this morning, GI recommended to put back on clear liquids.  Assessment/Plan: Principal Problem:   Acute colitis Active Problems:   GERD (gastroesophageal reflux disease)   Acute abdominal pain   Cholelithiasis    Acute colitis Presumed infectious, has low-grade fever of 99.3 on admission. Started on Cipro and Flagyl. C. difficile PCR is negative, rest of GI pathogen panel is pending. Patient tolerated clear liquids, started yesterday on full liquids. Did have nausea with grits, will diet back to clear liquids. Switch antibiotics to oral form.  Cholelithiasis RUQ ultrasound showed nearly full gallbladder with small stones. Cannot rule out chronic cholecystitis. But no evidence of acute cholecystitis. Has some nausea after food, GI recommended GEN surgery consultation.  History of PUD Patient positive for H. pylori with symptoms of PUD. Patient treated for H. pylori/peptic ulcer disease. Continue PPI.  Acute abdominal pain Secondary to acute colitis cannot rule out cholelithiasis as a cause. No epigastric abdominal pain, left flank pain consistent with colitis pain.  Code Status: Full Code Family Communication: Plan discussed with the patient. Disposition Plan: Remains inpatient Diet: Diet full liquid  Consultants:  GI  Procedures:  None  Antibiotics:  None   Objective: Filed Vitals:   04/08/14 1259  BP: 156/89  Pulse: 79  Temp: 97.8 F (36.6 C)  Resp:     Intake/Output Summary (Last 24 hours) at 04/08/14 1341 Last data filed at 04/08/14 0900  Gross per 24 hour  Intake    120  ml  Output      0 ml  Net    120 ml   Filed Weights   04/05/14 0610  Weight: 89.812 kg (198 lb)    Exam: General: Alert and awake, oriented x3, not in any acute distress. HEENT: anicteric sclera, pupils reactive to light and accommodation, EOMI CVS: S1-S2 clear, no murmur rubs or gallops Chest: clear to auscultation bilaterally, no wheezing, rales or rhonchi Abdomen: soft nontender, nondistended, normal bowel sounds, no organomegaly Extremities: no cyanosis, clubbing or edema noted bilaterally Neuro: Cranial nerves II-XII intact, no focal neurological deficits  Data Reviewed: Basic Metabolic Panel:  Recent Labs Lab 04/05/14 0615 04/06/14 0722 04/07/14 0650 04/08/14 0615  NA 136 135 138 138  K 3.9 3.6 3.5 3.2*  CL 99 101 105 104  CO2 29 27 24 27   GLUCOSE 157* 115* 102* 147*  BUN 8 7 6  <5*  CREATININE 1.08 0.89 0.95 1.02  CALCIUM 9.1 7.8* 8.2* 8.4   Liver Function Tests:  Recent Labs Lab 04/05/14 0615 04/07/14 0650  AST 37 22  ALT 35 30  ALKPHOS 79 63  BILITOT 0.8 0.9  PROT 8.0 5.9*  ALBUMIN 4.7 3.1*    Recent Labs Lab 04/05/14 0615  LIPASE 26   No results for input(s): AMMONIA in the last 168 hours. CBC:  Recent Labs Lab 04/05/14 0615 04/06/14 0722 04/07/14 0650 04/08/14 0615  WBC 10.2 12.6* 10.4 8.3  NEUTROABS 9.0*  --   --   --   HGB 17.5* 15.6 14.2 13.8  HCT 47.6 43.2 40.4 39.3  MCV 85.0 85.7 84.9 85.1  PLT 179 138* 138* 147*   Cardiac Enzymes:  Recent Labs Lab 04/05/14 0615  TROPONINI <0.03   BNP (last 3 results)  Recent Labs  04/05/14 0615  BNP 12.3    ProBNP (last 3 results) No results for input(s): PROBNP in the last 8760 hours.  CBG: No results for input(s): GLUCAP in the last 168 hours.  Micro Recent Results (from the past 240 hour(s))  Clostridium Difficile by PCR     Status: None   Collection Time: 04/05/14 10:52 AM  Result Value Ref Range Status   C difficile by pcr NEGATIVE NEGATIVE Final  Stool culture      Status: None (Preliminary result)   Collection Time: 04/05/14  6:57 PM  Result Value Ref Range Status   Specimen Description STOOL  Final   Special Requests NONE  Final   Culture   Final    NO SUSPICIOUS COLONIES, CONTINUING TO HOLD Note: REDUCED NORMAL FLORA PRESENT Performed at [facility]    Report Status PENDING  Incomplete     Studies: No results found.  Scheduled Meds: . ciprofloxacin  400 mg Intravenous Q12H  . metronidazole  500 mg Intravenous Q12H  . pantoprazole (PROTONIX) IV  40 mg Intravenous Q12H  . sodium chloride  3 mL Intravenous Q12H   Continuous Infusions: . sodium chloride 100 mL/hr at 04/08/14 0950       Time spent: 35 minutes    [facility] A  [facility] Pager [phone] If 7PM-7AM, please contact night-coverage at [email or web address], password [facility] 04/08/2014, 1:41 PM  LOS: 3 days

## 2014-04-08 NOTE — Consult Note (Signed)
Reason for Consult:cholelithiasis Referring Physician: Dr [name] is an 44 y.o. male.  HPI: 44 yo AAM admitted 2/2 with nausea/vomiting/abd pain/bloody stools and was found to have pancolitis on CT. his CT scan also demonstrated numerous gallstones within the gallbladder measuring as large as 2.3 cm. However there is no wall thickness, pericholecystic fluid. His abdominal ultrasound confirmed this. The wall thickness was at the upper limits of normal suggestive of perhaps chronic cholecystitis. We were asked to evaluate him for gallbladder disease.  He states he has had intermittent abdominal pain for over a year. He describes episodes that start in the epigastric area and radiated to the left side of his abdomen. They initially occurred very infrequently. However when they did occur they would last for about 6 hours each time. He would only get relief if he vomited. Over the past 6 months the episodes have become more frequent. This prompted him to present to the doctor's office. He states that they check blood work and was told that he was positive for H. pylori. He states he was given and placed on 2 antibiotics. This was then January. He states just prior to this admission he developed his typical sharp pain in his epigastric area however it would only last for about 10 minutes or so then it would go away and then it returned about an hour later and last for about 10 minutes and then go away but it did this for about 6 hours. This was associated with multiple episodes of emesis as well as bloody stool. He states this episode was similar to his prior episodes that he has had on and off for the past year and a half except for the fact that during this episode the pain would stay only for a short time and then go away and then return within the hour. He endorses a 36 pound weight loss over the past 3 months which has been unintentional. He denies any prior history of bloody bowel movements.  He states there is some cancer in the family but he cannot recall. When asked about stool changes he says he never really looks at his stool. He states that occasionally when he has these episodes the left side of his abdomen will swell. His LFTs have been normal during this admission as well as his white blood cell count. He also states that occasionally when he eats he felt like food just sits in his upper abdomen. He denies any early satiety. He denies any hematemesis. He denies any frequent NSAID use  Past Medical History  Diagnosis Date  . Gout   . 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 Tomi Likens).  MRI brain ordered.  . Migraines   . Chicken pox   . Epigastric pain 04/05/2014    Past Surgical History  Procedure Laterality Date  . Hernia repair  2006  . Wisdom tooth extraction      Family History  Problem Relation Age of Onset  . Diabetes Mother     Living  . Hypertension      family history  . Prostate cancer Father     family history  . Heart disease Paternal Uncle   . Heart disease Father   . Heart attack Father 81    Deceased  . Diabetes Sister     x3  . Cancer Other     Aunts & Uncles  . Cancer Other  Grandparents  . Heart attack Paternal Uncle   . Hyperlipidemia Mother   . Hyperlipidemia Father   . Hyperlipidemia Sister   . Diabetes Brother     x1  . Hyperlipidemia Brother     x1    Social History:  reports that he quit smoking about 5 weeks ago. He has never used smokeless tobacco. He reports that he drinks alcohol. He reports that he uses illicit drugs.  Allergies: No Known Allergies  Medications: I have reviewed the patient's current medications.  Results for orders placed or performed during the hospital encounter of 04/05/14 (from the past 48 hour(s))  Comprehensive metabolic panel     Status: Abnormal   Collection Time: 04/07/14  6:50 AM  Result Value Ref Range   Sodium 138 135 - 145 mmol/L    Potassium 3.5 3.5 - 5.1 mmol/L   Chloride 105 96 - 112 mmol/L   CO2 24 19 - 32 mmol/L   Glucose, Bld 102 (H) 70 - 99 mg/dL   BUN 6 6 - 23 mg/dL   Creatinine, Ser 0.95 0.50 - 1.35 mg/dL   Calcium 8.2 (L) 8.4 - 10.5 mg/dL   Total Protein 5.9 (L) 6.0 - 8.3 g/dL   Albumin 3.1 (L) 3.5 - 5.2 g/dL   AST 22 0 - 37 U/L   ALT 30 0 - 53 U/L   Alkaline Phosphatase 63 39 - 117 U/L   Total Bilirubin 0.9 0.3 - 1.2 mg/dL   GFR calc non Af Amer >90 >90 mL/min   GFR calc Af Amer >90 >90 mL/min    Comment: (NOTE) The eGFR has been calculated using the CKD EPI equation. This calculation has not been validated in all clinical situations. eGFR's persistently <90 mL/min signify possible Chronic Kidney Disease.    Anion gap 9 5 - 15  CBC     Status: Abnormal   Collection Time: 04/07/14  6:50 AM  Result Value Ref Range   WBC 10.4 4.0 - 10.5 K/uL   RBC 4.76 4.22 - 5.81 MIL/uL   Hemoglobin 14.2 13.0 - 17.0 g/dL   HCT 40.4 39.0 - 52.0 %   MCV 84.9 78.0 - 100.0 fL   MCH 29.8 26.0 - 34.0 pg   MCHC 35.1 30.0 - 36.0 g/dL   RDW 12.6 11.5 - 15.5 %   Platelets 138 (L) 150 - 400 K/uL  Basic metabolic panel     Status: Abnormal   Collection Time: 04/08/14  6:15 AM  Result Value Ref Range   Sodium 138 135 - 145 mmol/L   Potassium 3.2 (L) 3.5 - 5.1 mmol/L   Chloride 104 96 - 112 mmol/L   CO2 27 19 - 32 mmol/L   Glucose, Bld 147 (H) 70 - 99 mg/dL   BUN <5 (L) 6 - 23 mg/dL   Creatinine, Ser 1.02 0.50 - 1.35 mg/dL   Calcium 8.4 8.4 - 10.5 mg/dL   GFR calc non Af Amer 90 (L) >90 mL/min   GFR calc Af Amer >90 >90 mL/min    Comment: (NOTE) The eGFR has been calculated using the CKD EPI equation. This calculation has not been validated in all clinical situations. eGFR's persistently <90 mL/min signify possible Chronic Kidney Disease.    Anion gap 7 5 - 15  CBC     Status: Abnormal   Collection Time: 04/08/14  6:15 AM  Result Value Ref Range   WBC 8.3 4.0 - 10.5 K/uL   RBC 4.62 4.22 - 5.81 MIL/uL  Hemoglobin 13.8 13.0 - 17.0 g/dL   HCT 39.3 39.0 - 52.0 %   MCV 85.1 78.0 - 100.0 fL   MCH 29.9 26.0 - 34.0 pg   MCHC 35.1 30.0 - 36.0 g/dL   RDW 12.7 11.5 - 15.5 %   Platelets 147 (L) 150 - 400 K/uL    No results found.  Review of Systems  Constitutional: Positive for weight loss (30lb).  HENT: Negative for nosebleeds.   Eyes: Negative for blurred vision.  Respiratory: Negative for shortness of breath.   Cardiovascular: Negative for chest pain, palpitations, orthopnea and PND.       Denies DOE  Gastrointestinal: Positive for nausea, vomiting, abdominal pain and blood in stool. Negative for diarrhea and constipation.  Genitourinary: Negative for dysuria and hematuria.  Musculoskeletal: Negative.   Skin: Negative for itching and rash.  Neurological: Negative for dizziness, focal weakness, seizures, loss of consciousness and headaches.  Endo/Heme/Allergies: Does not bruise/bleed easily.  Psychiatric/Behavioral: The patient is not nervous/anxious.    Blood pressure 156/89, pulse 79, temperature 97.8 F (36.6 C), temperature source Oral, resp. rate 13, height $RemoveBe'5\' 7"'nlcgzzPrv$  (1.702 m), weight 198 lb (89.812 kg), SpO2 91 %. Physical Exam  Vitals reviewed. Constitutional: He is oriented to person, place, and time. He appears well-developed and well-nourished. No distress.  Mild obesity, nontoxic, not ill appearing.  HENT:  Head: Normocephalic and atraumatic.  Right Ear: External ear normal.  Left Ear: External ear normal.  Eyes: Conjunctivae are normal. No scleral icterus.  Neck: Normal range of motion. Neck supple. No tracheal deviation present. No thyromegaly present.  Cardiovascular: Normal rate and normal heart sounds.   Respiratory: Effort normal and breath sounds normal. No stridor. No respiratory distress. He has no wheezes.  GI: Soft. He exhibits no distension. There is no tenderness. There is no rebound and no guarding.  Full, but not protuberant/tympanic; nontender   Musculoskeletal: He exhibits no edema or tenderness.  Lymphadenopathy:    He has no cervical adenopathy.  Neurological: He is alert and oriented to person, place, and time. He exhibits normal muscle tone.  Skin: Skin is warm and dry. No rash noted. He is not diaphoretic. No erythema. No pallor.  Psychiatric: He has a normal mood and affect. His behavior is normal. Judgment and thought content normal.    Assessment/Plan: Acute pancolitis  Cholelithiasis Bloody stools Weight loss  I reviewed his CT scan, abdominal ultrasound and hospital chart. Currently there is no sign of acute cholecystitis. His history of intermittent epigastric pain that lasts for about 6 hours is probably related to his gallbladder however the only atypical part of his history is that it migrates to his left lower quadrant. He denies any right sided pain or pain rating to his shoulder. He perhaps has a component of chronic cholecystitis. To me the more concerning problem is his unexplained unintentional weight loss as well as his pain colitis. Unless his nuclear medicine scan shows active acute cholecystitis, I would not recommend cholecystectomy during this hospitalization while he is recovering from colitis. We would certainly be happy to discuss cholecystectomy with him as an outpatient. However would strongly consider outpatient colonoscopy and perhaps upper endoscopy as well. I believe he needs to have his colon further evaluated. I spent 45 minutes with this patient. I also spoke with his wife on the telephone as well.  Anthony Ruff. Redmond Pulling, MD, FACS General, Bariatric, & Minimally Invasive Surgery [facility], [place]    [facility] M 04/08/2014, 6:45 PM

## 2014-04-08 NOTE — Significant Event (Signed)
Called both [facility] and [facility] and endoscopy suites. Both centers do NOT have any records of EGD or colonoscopy for Mr. Harvill. Dr. Nicholes MangoHung/Mann do their EGDs and [facility]. Called general surgery for consultation for cholelithiasis. General surgery recommended HIDA scan, they will evaluate patient in the morning.  Clint LippsMutaz A  [facility] Pager: [phone] 04/08/2014, 5:45 PM

## 2014-04-09 ENCOUNTER — Inpatient Hospital Stay (HOSPITAL_COMMUNITY): Payer: BLUE CROSS/BLUE SHIELD

## 2014-04-09 LAB — CBC
HEMATOCRIT: 43.6 % (ref 39.0–52.0)
Hemoglobin: 15.7 g/dL (ref 13.0–17.0)
MCH: 30.1 pg (ref 26.0–34.0)
MCHC: 36 g/dL (ref 30.0–36.0)
MCV: 83.5 fL (ref 78.0–100.0)
PLATELETS: 223 10*3/uL (ref 150–400)
RBC: 5.22 MIL/uL (ref 4.22–5.81)
RDW: 12.6 % (ref 11.5–15.5)
WBC: 6.4 10*3/uL (ref 4.0–10.5)

## 2014-04-09 LAB — STOOL CULTURE

## 2014-04-09 MED ORDER — SODIUM CHLORIDE 0.9 % IV SOLN
INTRAVENOUS | Status: DC
Start: 2014-04-09 — End: 2014-04-10

## 2014-04-09 MED ORDER — PEG 3350-KCL-NA BICARB-NACL 420 G PO SOLR
4000.0000 mL | Freq: Once | ORAL | Status: AC
  Administered 2014-04-09: 4000 mL via ORAL
  Filled 2014-04-09: qty 4000

## 2014-04-09 MED ORDER — MORPHINE SULFATE 4 MG/ML IJ SOLN
0.0400 mg/kg | Freq: Once | INTRAMUSCULAR | Status: AC
  Administered 2014-04-09: 3.6 mg via INTRAVENOUS

## 2014-04-09 MED ORDER — POTASSIUM CHLORIDE CRYS ER 20 MEQ PO TBCR
40.0000 meq | EXTENDED_RELEASE_TABLET | Freq: Once | ORAL | Status: AC
  Administered 2014-04-09: 40 meq via ORAL
  Filled 2014-04-09: qty 2

## 2014-04-09 MED ORDER — TECHNETIUM TC 99M MEBROFENIN IV KIT
5.0000 | PACK | Freq: Once | INTRAVENOUS | Status: AC | PRN
  Administered 2014-04-09: 5 via INTRAVENOUS

## 2014-04-09 MED ORDER — TECHNETIUM TC 99M SESTAMIBI GENERIC - CARDIOLITE: 10.0000 | Freq: Once | INTRAVENOUS | Status: AC | PRN

## 2014-04-09 MED ORDER — TECHNETIUM TC 99M SESTAMIBI - CARDIOLITE: 30.0000 | Freq: Once | INTRAVENOUS | Status: AC | PRN

## 2014-04-09 MED ORDER — MORPHINE SULFATE 4 MG/ML IJ SOLN
INTRAMUSCULAR | Status: AC
  Administered 2014-04-09: 3.6 mg via INTRAVENOUS
  Filled 2014-04-09: qty 1

## 2014-04-09 NOTE — Progress Notes (Signed)
[facility] PROGRESS NOTE   Anthony Gill ZOX:[id number] DOB: 04/16/1970 DOA: 04/05/2014 PCP: Piedad ClimesMartin, William Cody, PA-C  HPI/Subjective: Denies nausea, still with diarrhea. Dark stool.  Still with abdominal pain.  He is confuse, he was treated for H pillory, now here we diagnosed him 2 more thing.  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   Assessment/Plan: Principal Problem:   Acute colitis Active Problems:   GERD (gastroesophageal reflux disease)   Acute abdominal pain   Cholelithiasis    1-Acute colitis Presumed infectious, has low-grade fever of 99.3 on admission. Continue with  Cipro and Flagyl. Day 4. C. difficile PCR is negative, rest of GI pathogen panel negative On clear diet. Dr Madilyn FiremanHayes will see patient today and discussed role for endoscopy/Colonoscopy with patient .  Diet per GI.   Cholelithiasis RUQ ultrasound showed nearly full gallbladder with small stones. HIDA: Nonvisualization of the gallbladder consistent with obstruction ofthe cystic duct and acute cholecystitis. Discussed HIDA scan results with Dr Abbey Chattersosenbower, patient does not have clinical cholecystitis. Patient will need Endoscopy/colonoscopy. Patient might need elective cholecystectomy outpatient. Main problem is colitis.   History of PUD Patient positive for H. pylori with symptoms of PUD. Patient treated for H. pylori/peptic ulcer disease. Continue PPI.  Acute abdominal pain Secondary to acute colitis cannot rule out cholelithiasis as a cause. Hypokalemia; replete with oral supplement.   Code Status: Full Code Family Communication: Plan discussed with the patient. Disposition Plan: Remains inpatient Diet: Diet full liquid  Consultants:  GI  surgery  Procedures:  None  Antibiotics:  None   Objective: Filed Vitals:   04/09/14 0534  BP: 125/64  Pulse: 80  Temp: 98.6 F (37 C)  Resp: 16    Intake/Output Summary (Last 24 hours)  at 04/09/14 1419 Last data filed at 04/09/14 0630  Gross per 24 hour  Intake   2950 ml  Output    300 ml  Net   2650 ml   Filed Weights   04/05/14 0610  Weight: 89.812 kg (198 lb)    Exam: General: Alert and awake, oriented x3, HEENT: anicteric sclera, pupils reactive to light and accommodation, EOMI CVS: S1-S2 clear, no murmur rubs or gallops Chest: clear to auscultation bilaterally, no wheezing, rales or rhonchi Abdomen: soft nontender, nondistended, normal bowel sounds, no organomegaly Extremities: no cyanosis, clubbing or edema noted bilaterally  Data Reviewed: Basic Metabolic Panel:  Recent Labs Lab 04/05/14 0615 04/06/14 0722 04/07/14 0650 04/08/14 0615  NA 136 135 138 138  K 3.9 3.6 3.5 3.2*  CL 99 101 105 104  CO2 29 27 24 27   GLUCOSE 157* 115* 102* 147*  BUN 8 7 6  <5*  CREATININE 1.08 0.89 0.95 1.02  CALCIUM 9.1 7.8* 8.2* 8.4   Liver Function Tests:  Recent Labs Lab 04/05/14 0615 04/07/14 0650  AST 37 22  ALT 35 30  ALKPHOS 79 63  BILITOT 0.8 0.9  PROT 8.0 5.9*  ALBUMIN 4.7 3.1*    Recent Labs Lab 04/05/14 0615  LIPASE 26   No results for input(s): AMMONIA in the last 168 hours. CBC:  Recent Labs Lab 04/05/14 0615 04/06/14 0722 04/07/14 0650 04/08/14 0615 04/09/14 1233  WBC 10.2 12.6* 10.4 8.3 6.4  NEUTROABS 9.0*  --   --   --   --   HGB 17.5* 15.6 14.2 13.8 15.7  HCT 47.6 43.2 40.4 39.3 43.6  MCV 85.0 85.7 84.9 85.1 83.5  PLT 179  138* 138* 147* 223   Cardiac Enzymes:  Recent Labs Lab 04/05/14 0615  TROPONINI <0.03   BNP (last 3 results)  Recent Labs  04/05/14 0615  BNP 12.3    ProBNP (last 3 results) No results for input(s): PROBNP in the last 8760 hours.  CBG: No results for input(s): GLUCAP in the last 168 hours.  Micro Recent Results (from the past 240 hour(s))  Clostridium Difficile by PCR     Status: None   Collection Time: 04/05/14 10:52 AM  Result Value Ref Range Status   C difficile by pcr NEGATIVE  NEGATIVE Final  Stool culture     Status: None   Collection Time: 04/05/14  6:57 PM  Result Value Ref Range Status   Specimen Description STOOL  Final   Special Requests NONE  Final   Culture   Final    NO SALMONELLA, SHIGELLA, CAMPYLOBACTER, YERSINIA, OR E.COLI [id number] ISOLATED Note: REDUCED NORMAL FLORA PRESENT Performed at [facility]    Report Status 04/09/2014 FINAL  Final     Studies: Nm Hepatobiliary Including Gb  04/09/2014   CLINICAL DATA:  44 year old male with right upper quadrant pain concerning for acute cholecystitis  EXAM: NUCLEAR MEDICINE HEPATOBILIARY IMAGING  TECHNIQUE: Sequential images of the abdomen were obtained out to 60 minutes following intravenous administration of radiopharmaceutical. Additionally, 3.6 mg morphine was administered after 60 min of nonvisualization of the gallbladder.  RADIOPHARMACEUTICALS:  Five [employer] Tc-85m Choletec.  COMPARISON:  Prior CT abdomen/ pelvis and abdominal ultrasound 04/05/2014  FINDINGS: Normal hepatic uptake of the radiotracer. Contrast material can be seen within the intrahepatic bile ducts and within the proximal duodenum by 10 min. At 60 min, the gallbladder remains non visualized.  Additional imaging following administration of morphine again demonstrates nonvisualization of the gallbladder.  IMPRESSION: Nonvisualization of the gallbladder consistent with obstruction of the cystic duct and acute cholecystitis.   Electronically Signed   By: Malachy Moan M.D.   On: 04/09/2014 10:47    Scheduled Meds: . ciprofloxacin  400 mg Intravenous Q12H  . metronidazole  500 mg Intravenous Q12H  . pantoprazole (PROTONIX) IV  40 mg Intravenous Q12H  . potassium chloride  40 mEq Oral Once  . sodium chloride  3 mL Intravenous Q12H   Continuous Infusions: . sodium chloride 100 mL/hr at 04/08/14 2101       Time spent: 35 minutes    ,  A  [facility] Pager [phone] If 7PM-7AM, please contact  night-coverage at [email or web address], password [facility] 04/09/2014, 2:19 PM  LOS: 4 days

## 2014-04-09 NOTE — Progress Notes (Signed)
[facility] Progress Note  Subjective: Patient experiencing less abdominal pain but some persistent in the epigastric and left mid abdomen 1 semi-formed stool that was nonbloody today.  Objective: Vital signs in last 24 hours: Temp:  [97.6 F (36.4 C)-98.6 F (37 C)] 98.6 F (37 C) (02/06 0534) Pulse Rate:  [80] 80 (02/06 0534) Resp:  [16] 16 (02/06 0534) BP: (112-125)/(61-72) 125/64 mmHg (02/06 0534) SpO2:  [98 %-99 %] 98 % (02/06 0534) Weight change:    PE: Abdomen soft mild epigastric and left sided abdominal pain  Lab Results: Results for orders placed or performed during the hospital encounter of 04/05/14 (from the past 24 hour(s))  CBC     Status: None   Collection Time: 04/09/14 12:33 PM  Result Value Ref Range   WBC 6.4 4.0 - 10.5 K/uL   RBC 5.22 4.22 - 5.81 MIL/uL   Hemoglobin 15.7 13.0 - 17.0 g/dL   HCT 78.243.6 95.639.0 - 21.352.0 %   MCV 83.5 78.0 - 100.0 fL   MCH 30.1 26.0 - 34.0 pg   MCHC 36.0 30.0 - 36.0 g/dL   RDW 08.612.6 57.811.5 - 46.915.5 %   Platelets 223 150 - 400 K/uL    Studies/Results: Nm Hepatobiliary Including Gb  04/09/2014   CLINICAL DATA:  44 year old male with right upper quadrant pain concerning for acute cholecystitis  EXAM: NUCLEAR MEDICINE HEPATOBILIARY IMAGING  TECHNIQUE: Sequential images of the abdomen were obtained out to 60 minutes following intravenous administration of radiopharmaceutical. Additionally, 3.6 mg morphine was administered after 60 min of nonvisualization of the gallbladder.  RADIOPHARMACEUTICALS:  Five Millicurie Tc-2333m Choletec.  COMPARISON:  Prior CT abdomen/ pelvis and abdominal ultrasound 04/05/2014  FINDINGS: Normal hepatic uptake of the radiotracer. Contrast material can be seen within the intrahepatic bile ducts and within the proximal duodenum by 10 min. At 60 min, the gallbladder remains non visualized.  Additional imaging following administration of morphine again demonstrates nonvisualization of the gallbladder.  IMPRESSION:  Nonvisualization of the gallbladder consistent with obstruction of the cystic duct and acute cholecystitis.   Electronically Signed   By: Malachy MoanHeath  McCullough M.D.   On: 04/09/2014 10:47      Assessment: Acute colitis from the cecum to the sigmoid colon, cultures negative, improving clinically Epigastric abdominal pain previously treated with proton pump inhibitor and for H. Pylori. Gallstones filling the gallbladder with positive hepatobiliary scan  Plan: Appreciate surgery's assistance. Will proceed with EGD and at least partial colonoscopy to assess status of any residual acid peptic disease and the acute colitis which S Specht is infectious although it could be new onset of inflammatory bowel disease. We'll probably need cholecystectomy eventually.    , C 04/09/2014, 3:17 PM

## 2014-04-09 NOTE — Progress Notes (Signed)
Assessment/Plan Acute pancolitis  Cholelithiasis with chronic cholecystitis Bloody stools Weight loss  Plan: 1.  Does have positive HIDA, but do not feel this is accurate (Please see Dr. Maris Bergerosenbower's note), may have some chronic cholecystitis, but this can be handled as an OP once his colitis is resolved.  Surgery is higher risk with colitis.  He should have GI workup finalized and resolution of colitis prior to arranging surgery for gallbladder.  Follow up with Dr. Andrey CampanileWilson in the office in 2-3 weeks.     LOS: 6 days    DORT, Aundra MilletMEGAN 04/11/2014, 10:05 AM Pager: [phone]

## 2014-04-09 NOTE — Progress Notes (Signed)
Patient seen and examined.  Feels much better.  Some tenderness in RLQ and LUQ.  HIDA results pending.  Colitis is the main issue here in my opinion, likely has chronic calculous cholecystitis-nothing clinical to suggest acute cholecystitis.  Needs upper and lower endoscopies.

## 2014-04-10 ENCOUNTER — Encounter (HOSPITAL_COMMUNITY): Payer: Self-pay

## 2014-04-10 ENCOUNTER — Encounter (HOSPITAL_COMMUNITY)
Admission: EM | Disposition: A | Discharge status: Home / Self Care | Payer: Self-pay | Source: Home / Self Care | Attending: Internal Medicine

## 2014-04-10 HISTORY — PX: COLONOSCOPY: SHX5424

## 2014-04-10 HISTORY — PX: ESOPHAGOGASTRODUODENOSCOPY: SHX5428

## 2014-04-10 LAB — BASIC METABOLIC PANEL
ANION GAP: 6 (ref 5–15)
BUN: 5 mg/dL — ABNORMAL LOW (ref 6–23)
CALCIUM: 8.7 mg/dL (ref 8.4–10.5)
CHLORIDE: 105 mmol/L (ref 96–112)
CO2: 27 mmol/L (ref 19–32)
Creatinine, Ser: 0.96 mg/dL (ref 0.50–1.35)
GFR calc Af Amer: >90 mL/min (ref >90)
GLUCOSE: 105 mg/dL — AB (ref 70–99)
Potassium: 3.7 mmol/L (ref 3.5–5.1)
SODIUM: 138 mmol/L (ref 135–145)

## 2014-04-10 LAB — CBC
HCT: 39.7 % (ref 39.0–52.0)
HEMOGLOBIN: 14 g/dL (ref 13.0–17.0)
MCH: 29.8 pg (ref 26.0–34.0)
MCHC: 35.3 g/dL (ref 30.0–36.0)
MCV: 84.5 fL (ref 78.0–100.0)
PLATELETS: 199 10*3/uL (ref 150–400)
RBC: 4.7 MIL/uL (ref 4.22–5.81)
RDW: 12.5 % (ref 11.5–15.5)
WBC: 5.2 10*3/uL (ref 4.0–10.5)

## 2014-04-10 SURGERY — EGD (ESOPHAGOGASTRODUODENOSCOPY)
Anesthesia: Moderate Sedation

## 2014-04-10 MED ORDER — FENTANYL CITRATE 0.05 MG/ML IJ SOLN
INTRAMUSCULAR | Status: DC | PRN
  Administered 2014-04-10 (×4): 25 ug via INTRAVENOUS

## 2014-04-10 MED ORDER — SODIUM CHLORIDE 0.9 % IV SOLN: INTRAVENOUS | Status: DC

## 2014-04-10 MED ORDER — DIPHENHYDRAMINE HCL 50 MG/ML IJ SOLN
INTRAMUSCULAR | Status: AC
  Filled 2014-04-10: qty 1

## 2014-04-10 MED ORDER — BUTAMBEN-TETRACAINE-BENZOCAINE 2-2-14 % EX AERO
INHALATION_SPRAY | CUTANEOUS | Status: DC | PRN
  Administered 2014-04-10: 2 via TOPICAL

## 2014-04-10 MED ORDER — FENTANYL CITRATE 0.05 MG/ML IJ SOLN
INTRAMUSCULAR | Status: AC
  Filled 2014-04-10: qty 4

## 2014-04-10 MED ORDER — MIDAZOLAM HCL 5 MG/ML IJ SOLN
INTRAMUSCULAR | Status: AC
  Filled 2014-04-10: qty 2

## 2014-04-10 MED ORDER — CIPROFLOXACIN HCL 500 MG PO TABS
500.0000 mg | ORAL_TABLET | Freq: Two times a day (BID) | ORAL | Status: DC
  Administered 2014-04-10 – 2014-04-11 (×4): 500 mg via ORAL
  Filled 2014-04-10 (×5): qty 1

## 2014-04-10 MED ORDER — MIDAZOLAM HCL 5 MG/5ML IJ SOLN
INTRAMUSCULAR | Status: DC | PRN
  Administered 2014-04-10: 2 mg via INTRAVENOUS
  Administered 2014-04-10: 1 mg via INTRAVENOUS
  Administered 2014-04-10: 2 mg via INTRAVENOUS

## 2014-04-10 NOTE — Op Note (Signed)
[facility] [address] [place], [id number]   COLONOSCOPY PROCEDURE REPORT     EXAM DATE: 04/10/2014  PATIENT NAME:      Anthony Gill, Anthony Gill           MR #:      [id number]  BIRTHDATE:       Jul 09, 1970      VISIT #:     [id number]  ATTENDING:     Dorena CookeyJohn , MD     STATUS:     outpatient REFERRING MD: ASA CLASS:  INDICATIONS:  The patient is a 44 yr old male here for a colonoscopy due to rectal bleeding and colitis PROCEDURE PERFORMED:     colonoscopy MEDICATIONS:     fentanyl 50 g Versed 1 mg ESTIMATED BLOOD LOSS:     None  CONSENT: The patient understands the risks and benefits of the procedure and understands that these risks include, but are not limited to: sedation, allergic reaction, infection, perforation and/or bleeding. Alternative means of evaluation and treatment include, among others: physical exam, x-rays, and/or surgical intervention. The patient elects to proceed with this endoscopic procedure.  DESCRIPTION OF PROCEDURE: During intra-op preparation period all mechanical & medical equipment was checked for proper function. Hand hygiene and appropriate measures for infection prevention was taken. After the risks, benefits and alternatives of the procedure were thoroughly explained, Informed consent was verified, confirmed and timeout was successfully executed by the treatment team. A digital exam The Pentax Ped Colon [id number] endoscope was introduced through the anus and advanced to the cecum     . No adverse events experienced. The prep was good     . The instrument was then slowly withdrawn as the colon was fully examined. there was mild erythema and friability and granularity within the descending and transverse colon only. This was very minimal area there is no focal ulcers or erosions or active bleeding. No polyps or diverticuli were seen. The mucosa appeared normal from the descending colon to the rectum. Biopsies were  taken of the mildly inflamed area          The scope was then completely withdrawn from the patient and the procedure terminated. WITHDRAWAL TIME: 6 minutes    ADVERSE EVENTS:      There were no immediate complications.  IMPRESSIONS:     very mild appearing colitis of the transverse and descending colon consistent with a resolving infection rule out inflammatory bowel disease.  RECOMMENDATIONS:     await biopsy results. Will switch to oral antibiotics. RECALL:  Dorena CookeyJohn , MD eSigned:  Dorena CookeyJohn , MD 04/10/2014 10:34 AM   cc:  CPT CODES: ICD CODES:  The ICD and CPT codes recommended by this software are interpretations from the data that the clinical staff has captured with the software.  The verification of the translation of this report to the ICD and CPT codes and modifiers is the sole responsibility of the health care institution and practicing physician where this report was generated.  [employer] will not be held responsible for the validity of the ICD and CPT codes included on this report.  AMA assumes no liability for data contained or not contained herein. CPT is a Publishing rights managerregistered trademark of the [employer].

## 2014-04-10 NOTE — Progress Notes (Signed)
[facility] Progress Note  Subjective: Tolerated bowel prep, some abdominal pain generalized  Objective: Vital signs in last 24 hours: Temp:  [98.2 F (36.8 C)-98.5 F (36.9 C)] 98.2 F (36.8 C) (02/07 0948) Pulse Rate:  [71-92] 82 (02/07 1035) Resp:  [11-16] 13 (02/07 1035) BP: (130-166)/(72-116) 151/95 mmHg (02/07 1035) SpO2:  [93 %-99 %] 96 % (02/07 1035) Weight change:    PE: Abdomen soft nondistended  Lab Results: Results for orders placed or performed during the hospital encounter of 04/05/14 (from the past 24 hour(s))  CBC     Status: None   Collection Time: 04/09/14 12:33 PM  Result Value Ref Range   WBC 6.4 4.0 - 10.5 K/uL   RBC 5.22 4.22 - 5.81 MIL/uL   Hemoglobin 15.7 13.0 - 17.0 g/dL   HCT 16.1 09.6 - 04.5 %   MCV 83.5 78.0 - 100.0 fL   MCH 30.1 26.0 - 34.0 pg   MCHC 36.0 30.0 - 36.0 g/dL   RDW 40.9 81.1 - 91.4 %   Platelets 223 150 - 400 K/uL  CBC     Status: None   Collection Time: 04/10/14  6:23 AM  Result Value Ref Range   WBC 5.2 4.0 - 10.5 K/uL   RBC 4.70 4.22 - 5.81 MIL/uL   Hemoglobin 14.0 13.0 - 17.0 g/dL   HCT 78.2 95.6 - 21.3 %   MCV 84.5 78.0 - 100.0 fL   MCH 29.8 26.0 - 34.0 pg   MCHC 35.3 30.0 - 36.0 g/dL   RDW 08.6 57.8 - 46.9 %   Platelets 199 150 - 400 K/uL  Basic metabolic panel     Status: Abnormal   Collection Time: 04/10/14  6:23 AM  Result Value Ref Range   Sodium 138 135 - 145 mmol/L   Potassium 3.7 3.5 - 5.1 mmol/L   Chloride 105 96 - 112 mmol/L   CO2 27 19 - 32 mmol/L   Glucose, Bld 105 (H) 70 - 99 mg/dL   BUN 5 (L) 6 - 23 mg/dL   Creatinine, Ser 6.29 0.50 - 1.35 mg/dL   Calcium 8.7 8.4 - 52.8 mg/dL   GFR calc non Af Amer >90 >90 mL/min   GFR calc Af Amer >90 >90 mL/min   Anion gap 6 5 - 15    Studies/Results: Nm Hepatobiliary Including Gb  04/09/2014   CLINICAL DATA:  44 year old male with right upper quadrant pain concerning for acute cholecystitis  EXAM: NUCLEAR MEDICINE HEPATOBILIARY IMAGING  TECHNIQUE:  Sequential images of the abdomen were obtained out to 60 minutes following intravenous administration of radiopharmaceutical. Additionally, 3.6 mg morphine was administered after 60 min of nonvisualization of the gallbladder.  RADIOPHARMACEUTICALS:  Five [employer] Tc-87m Choletec.  COMPARISON:  Prior CT abdomen/ pelvis and abdominal ultrasound 04/05/2014  FINDINGS: Normal hepatic uptake of the radiotracer. Contrast material can be seen within the intrahepatic bile ducts and within the proximal duodenum by 10 min. At 60 min, the gallbladder remains non visualized.  Additional imaging following administration of morphine again demonstrates nonvisualization of the gallbladder.  IMPRESSION: Nonvisualization of the gallbladder consistent with obstruction of the cystic duct and acute cholecystitis.   Electronically Signed   By: Malachy Moan M.D.   On: 04/09/2014 10:47   Colonoscopy and EGD done, very mild resolving appearing colitis in the distribution seen on CT scan, EGD essentially negative except for small hiatal hernia   Assessment: Probable infectious colitis seems to be resolving both clinically and endoscopically History of  H. pylori antibody, treated await repeat CLOtest, otherwise minimal hiatal hernia on EGD Gallstones  Plan: Switch to oral antibiotics Await CLOtest Advance diet If continues to have upper abdominal pain consider cholecystectomy    , C 04/10/2014, 10:37 AM

## 2014-04-10 NOTE — Op Note (Signed)
[facility] [address] [place], [id number]   ENDOSCOPY PROCEDURE REPORT  PATIENT: Anthony Gill, Anthony Gill  MR#: [id number] BIRTHDATE: Dec 09, 1970 , 43  yrs. old GENDER: male ENDOSCOPIST: Dorena CookeyJohn , MD REFERRED BY: PROCEDURE DATE:  04/10/2014 PROCEDURE: ASA CLASS: INDICATIONS:  epigastric abdominal pain and known gallstones MEDICATIONS: fentanyl 50 g, Versed 4 mg TOPICAL ANESTHETIC:  DESCRIPTION OF PROCEDURE: After the risks benefits and alternatives of the procedure were thoroughly explained, informed consent was obtained.  The Pentax Gastroscope [id number] endoscope was introduced through the mouth and advanced to the second portion of the duodenum , Without limitations.  The instrument was slowly withdrawn as the mucosa was fully examined.    esophagus: Small sliding hiatal hernia was no esophagitis Stomach possible minimal antral gastritis no focal erosion or ulcer. CLOtest obtained       reflex review of the cardia unremarkable duodenum: Normal The scope was then withdrawn from the patient and the procedure completed.  COMPLICATIONS: There were no immediate complications.  ENDOSCOPIC IMPRESSION: Small hiatal hernia, history of positive H. pylori antibody status post treatment  RECOMMENDATIONS: continue proton pump inhibitor and await CLOtest. We'll proceed with colonoscopy due to presentation of colitis.  REPEAT EXAM:  eSigned:  Dorena CookeyJohn , MD 04/10/2014 10:15 AM    CC:  CPT CODES: ICD CODES:  The ICD and CPT codes recommended by this software are interpretations from the data that the clinical staff has captured with the software.  The verification of the translation of this report to the ICD and CPT codes and modifiers is the sole responsibility of the health care institution and practicing physician where this report was generated.  [employer] will not be held responsible for the validity of the ICD and CPT  codes included on this report.  AMA assumes no liability for data contained or not contained herein. CPT is a Publishing rights managerregistered trademark of the [employer].  PATIENT NAME:  Anthony Gill, Anthony Gill MR#: [id number]

## 2014-04-10 NOTE — Progress Notes (Signed)
[facility] PROGRESS NOTE   Anthony Gill ZOX:[id number] DOB: May 18, 1970 DOA: 04/05/2014 PCP: Piedad ClimesMartin, William Cody, PA-C  HPI/Subjective: He just came from endoscopy.  He is sleepy.  He denies abdominal pain at this time.  He relates he had abdominal pain early this morning.   Assessment/Plan: Principal Problem:   Acute colitis Active Problems:   GERD (gastroesophageal reflux disease)   Acute abdominal pain   Cholelithiasis    1-Acute colitis Presumed infectious, has low-grade fever of 99.3 on admission. Continue with  Cipro and Flagyl. Day 5. C. difficile PCR is negative, rest of GI pathogen panel negative Diet advance to soft today/  S/P Endoscopy which only show small hiatal hernia. Colonoscopy: very mild appearing colitis of the transverse and descending colon consistent with resolving infection. Await biopsy.    Cholelithiasis RUQ ultrasound showed nearly full gallbladder with small stones. HIDA: Nonvisualization of the gallbladder consistent with obstruction ofthe cystic duct and acute cholecystitis. Discussed HIDA scan results with Dr Abbey Chattersosenbower, patient does not have clinical cholecystitis.  Patient S/P endoscopy/colonoscopy.  Awaiting surgery recommendation.   History of positive H. Pylori antibody; Patient positive for H. pylori with symptoms of PUD. Patient treated for H. pylori/ Continue PPI. S/P endoscopy, which was negative for ulcer. Biopsy pending.   Hypokalemia; resolved.   Code Status: Full Code Family Communication: Plan discussed with the patient. And wife.  Disposition Plan: Remains inpatient Diet: DIET SOFT  Consultants:  GI  surgery  Procedures:  None  Antibiotics:  Cipro/flagyl    Objective: Filed Vitals:   04/10/14 1412  BP: 138/93  Pulse: 89  Temp: 98.3 F (36.8 C)  Resp: 16    Intake/Output Summary (Last 24 hours) at 04/10/14 1544 Last data filed at 04/09/14 1900  Gross per 24 hour  Intake   1450 ml    Output      0 ml  Net   1450 ml   Filed Weights   04/05/14 0610  Weight: 89.812 kg (198 lb)    Exam: General: Alert and awake, oriented x3, CVS: S1-S2 clear, no murmur rubs or gallops Chest: clear to auscultation bilaterally, no wheezing, rales or rhonchi Abdomen: soft nontender, nondistended, normal bowel sounds, no organomegaly Extremities: no cyanosis, clubbing or edema noted bilaterally  Data Reviewed: Basic Metabolic Panel:  Recent Labs Lab 04/05/14 0615 04/06/14 0722 04/07/14 0650 04/08/14 0615 04/10/14 0623  NA 136 135 138 138 138  K 3.9 3.6 3.5 3.2* 3.7  CL 99 101 105 104 105  CO2 29 27 24 27 27   GLUCOSE 157* 115* 102* 147* 105*  BUN 8 7 6  <5* 5*  CREATININE 1.08 0.89 0.95 1.02 0.96  CALCIUM 9.1 7.8* 8.2* 8.4 8.7   Liver Function Tests:  Recent Labs Lab 04/05/14 0615 04/07/14 0650  AST 37 22  ALT 35 30  ALKPHOS 79 63  BILITOT 0.8 0.9  PROT 8.0 5.9*  ALBUMIN 4.7 3.1*    Recent Labs Lab 04/05/14 0615  LIPASE 26   No results for input(s): AMMONIA in the last 168 hours. CBC:  Recent Labs Lab 04/05/14 0615 04/06/14 0722 04/07/14 0650 04/08/14 0615 04/09/14 1233 04/10/14 0623  WBC 10.2 12.6* 10.4 8.3 6.4 5.2  NEUTROABS 9.0*  --   --   --   --   --   HGB 17.5* 15.6 14.2 13.8 15.7 14.0  HCT 47.6 43.2 40.4 39.3 43.6 39.7  MCV 85.0 85.7 84.9 85.1 83.5 84.5  PLT 179 138* 138* 147* 223 199  Cardiac Enzymes:  Recent Labs Lab 04/05/14 0615  TROPONINI <0.03   BNP (last 3 results)  Recent Labs  04/05/14 0615  BNP 12.3    ProBNP (last 3 results) No results for input(s): PROBNP in the last 8760 hours.  CBG: No results for input(s): GLUCAP in the last 168 hours.  Micro Recent Results (from the past 240 hour(s))  Clostridium Difficile by PCR     Status: None   Collection Time: 04/05/14 10:52 AM  Result Value Ref Range Status   C difficile by pcr NEGATIVE NEGATIVE Final  Stool culture     Status: None   Collection Time: 04/05/14   6:57 PM  Result Value Ref Range Status   Specimen Description STOOL  Final   Special Requests NONE  Final   Culture   Final    NO SALMONELLA, SHIGELLA, CAMPYLOBACTER, YERSINIA, OR E.COLI 0157:H7 ISOLATED Note: REDUCED NORMAL FLORA PRESENT Performed at [facility]    Report Status 04/09/2014 FINAL  Final     Studies: Nm Hepatobiliary Including Gb  04/09/2014   CLINICAL DATA:  44 year old male with right upper quadrant pain concerning for acute cholecystitis  EXAM: NUCLEAR MEDICINE HEPATOBILIARY IMAGING  TECHNIQUE: Sequential images of the abdomen were obtained out to 60 minutes following intravenous administration of radiopharmaceutical. Additionally, 3.6 mg morphine was administered after 60 min of nonvisualization of the gallbladder.  RADIOPHARMACEUTICALS:  Five [employer]ie Tc-36m Choletec.  COMPARISON:  Prior CT abdomen/ pelvis and abdominal ultrasound 04/05/2014  FINDINGS: Normal hepatic uptake of the radiotracer. Contrast material can be seen within the intrahepatic bile ducts and within the proximal duodenum by 10 min. At 60 min, the gallbladder remains non visualized.  Additional imaging following administration of morphine again demonstrates nonvisualization of the gallbladder.  IMPRESSION: Nonvisualization of the gallbladder consistent with obstruction of the cystic duct and acute cholecystitis.   Electronically Signed   By: Malachy Moan M.D.   On: 04/09/2014 10:47    Scheduled Meds: . ciprofloxacin  500 mg Oral BID  . pantoprazole (PROTONIX) IV  40 mg Intravenous Q12H  . sodium chloride  3 mL Intravenous Q12H   Continuous Infusions: . sodium chloride 100 mL/hr at 04/09/14 1825       Time spent: 35 minutes    ,  A  [facility] Pager [phone] If 7PM-7AM, please contact night-coverage at [email or web address], password [email or web address] 04/10/2014, 3:44 PM  LOS: 5 days

## 2014-04-11 ENCOUNTER — Encounter (HOSPITAL_COMMUNITY): Payer: Self-pay | Admitting: Gastroenterology

## 2014-04-11 LAB — CLOTEST (H. PYLORI), BIOPSY: Helicobacter screen: NEGATIVE

## 2014-04-11 MED ORDER — HYDROCODONE-ACETAMINOPHEN 5-325 MG PO TABS: 1.0000 | ORAL_TABLET | Freq: Four times a day (QID) | ORAL | Status: DC | PRN

## 2014-04-11 MED ORDER — DIPHENHYDRAMINE HCL 25 MG PO CAPS
25.0000 mg | ORAL_CAPSULE | ORAL | Status: DC | PRN
  Administered 2014-04-11: 25 mg via ORAL
  Filled 2014-04-11: qty 1

## 2014-04-11 MED ORDER — OXYCODONE-ACETAMINOPHEN 5-325 MG PO TABS
1.0000 | ORAL_TABLET | Freq: Four times a day (QID) | ORAL | Status: DC | PRN
  Administered 2014-04-11 – 2014-04-12 (×3): 2 via ORAL
  Filled 2014-04-11 (×3): qty 2

## 2014-04-11 MED ORDER — SODIUM CHLORIDE 0.9 % IV SOLN
3.0000 g | Freq: Four times a day (QID) | INTRAVENOUS | Status: DC
  Administered 2014-04-11 – 2014-04-12 (×3): 3 g via INTRAVENOUS
  Filled 2014-04-11 (×6): qty 3

## 2014-04-11 MED ORDER — PANTOPRAZOLE SODIUM 40 MG PO TBEC
40.0000 mg | DELAYED_RELEASE_TABLET | Freq: Two times a day (BID) | ORAL | Status: DC
  Administered 2014-04-11 – 2014-04-12 (×2): 40 mg via ORAL
  Filled 2014-04-11: qty 1

## 2014-04-11 NOTE — Progress Notes (Signed)
[facility] PROGRESS NOTE   Anthony Gill ZOX:[id number] DOB: 21-Aug-1970 DOA: 04/05/2014 PCP: Piedad Climes, PA-C  HPI/Subjective: He relates he had an episode of abdominal pain last night , very bad.  This morning his pain is better, had some nausea.  Willing to try oral pain medication for pain.   Assessment/Plan: Principal Problem:   Acute colitis Active Problems:   GERD (gastroesophageal reflux disease)   Acute abdominal pain   Cholelithiasis   1-Acute colitis Continue with  Cipro and Flagyl. Day 6. C. difficile PCR is negative, rest of GI pathogen panel negative On soft diet  S/P Endoscopy which only show small hiatal hernia. Colonoscopy: very mild appearing colitis of the transverse and descending colon consistent with resolving infection. Await biopsy.    Cholelithiasis RUQ ultrasound showed nearly full gallbladder with small stones. HIDA: Nonvisualization of the gallbladder consistent with obstruction ofthe cystic duct and acute cholecystitis. Discussed HIDA scan results with Dr Abbey Chatters, patient does not have clinical cholecystitis.  Patient S/P endoscopy/colonoscopy.  Surgery recommending cholecystectomy at later time.   History of positive H. Pylori antibody; Patient positive for H. pylori with symptoms of PUD. Patient treated for H. pylori/ Continue PPI. S/P endoscopy, which was negative for ulcer. Biopsy pending.  Helicobacter screen negative.   Hypokalemia; resolved.   Code Status: Full Code Family Communication: Plan discussed with the patient. And wife.  Disposition Plan: Remains inpatient. Home 2-9 is stable.  Diet: DIET SOFT  Consultants:  GI  surgery  Procedures:  None  Antibiotics:  Cipro/flagyl    Objective: Filed Vitals:   04/11/14 1313  BP: 142/83  Pulse:   Temp: 98.4 F (36.9 C)  Resp: 16    Intake/Output Summary (Last 24 hours) at 04/11/14 1651 Last data filed at 04/11/14 1000  Gross per 24 hour    Intake   1280 ml  Output    750 ml  Net    530 ml   Filed Weights   04/05/14 0610  Weight: 89.812 kg (198 lb)    Exam: General: Alert and awake, oriented x3, CVS: S1-S2 clear, no murmur rubs or gallops Chest: clear to auscultation bilaterally, no wheezing, rales or rhonchi Abdomen: soft nontender, nondistended, normal bowel sounds, no organomegaly Extremities: no cyanosis, clubbing or edema noted bilaterally  Data Reviewed: Basic Metabolic Panel:  Recent Labs Lab 04/05/14 0615 04/06/14 0722 04/07/14 0650 04/08/14 0615 04/10/14 0623  NA 136 135 138 138 138  K 3.9 3.6 3.5 3.2* 3.7  CL 99 101 105 104 105  CO2 GLUCOSE 157* 115* 102* 147* 105*  BUN <5* 5*  CREATININE 1.08 0.89 0.95 1.02 0.96  CALCIUM 9.1 7.8* 8.2* 8.4 8.7   Liver Function Tests:  Recent Labs Lab 04/05/14 0615 04/07/14 0650  AST 37 22  ALT 35 30  ALKPHOS 79 63  BILITOT 0.8 0.9  PROT 8.0 5.9*  ALBUMIN 4.7 3.1*    Recent Labs Lab 04/05/14 0615  LIPASE 26   No results for input(s): AMMONIA in the last 168 hours. CBC:  Recent Labs Lab 04/05/14 0615 04/06/14 0722 04/07/14 0650 04/08/14 0615 04/09/14 1233 04/10/14 0623  WBC 10.2 12.6* 10.4 8.3 6.4 5.2  NEUTROABS 9.0*  --   --   --   --   --   HGB 17.5* 15.6 14.2 13.8 15.7 14.0  HCT 47.6 43.2 40.4 39.3 43.6 39.7  MCV 85.0 85.7 84.9 85.1 83.5 84.5  PLT 179 138*  138* 147* 223 199   Cardiac Enzymes:  Recent Labs Lab 04/05/14 0615  TROPONINI <0.03   BNP (last 3 results)  Recent Labs  04/05/14 0615  BNP 12.3    ProBNP (last 3 results) No results for input(s): PROBNP in the last 8760 hours.  CBG: No results for input(s): GLUCAP in the last 168 hours.  Micro Recent Results (from the past 240 hour(s))  Clostridium Difficile by PCR     Status: None   Collection Time: 04/05/14 10:52 AM  Result Value Ref Range Status   C difficile by pcr NEGATIVE NEGATIVE Final  Stool culture     Status: None    Collection Time: 04/05/14  6:57 PM  Result Value Ref Range Status   Specimen Description STOOL  Final   Special Requests NONE  Final   Culture   Final    NO SALMONELLA, SHIGELLA, CAMPYLOBACTER, YERSINIA, OR E.COLI 0157:H7 ISOLATED Note: REDUCED NORMAL FLORA PRESENT [facility]formed at [facility]    Report Status 04/09/2014 FINAL  Final     Studies: No results found.  Scheduled Meds: . ciprofloxacin  500 mg Oral BID  . pantoprazole  40 mg Oral BID  . sodium chloride  3 mL Intravenous Q12H   Continuous Infusions: . sodium chloride 100 mL/hr at 04/09/14 1825       Time spent: 35 minutes    ,  A  [facility] Pager [phone] If 7PM-7AM, please contact night-coverage at [email or web address], password [email or web address] 04/11/2014, 4:51 PM  LOS: 6 days

## 2014-04-11 NOTE — Progress Notes (Signed)
Patient was awaken by 10/10 sharp pain in his right abdomen radiating to mid upper quad of abdomen. Patient received 2mg   Of IV dilaudid.  Pt pacing and with nauseated 2nd to abdominal pain.Marland Kitchen. Zofran given IV.

## 2014-04-11 NOTE — Progress Notes (Signed)
[facility] Progress Note  Subjective: The patient is eating breakfast and tolerating it well. He was found to have mild colitis on colonoscopy. Biopsies pending. He also has gallstones. He has been having intermittent epigastric pain for a year. When he came into the hospital he had more pain and also diarrhea with rectal bleeding.  Objective: Vital signs in last 24 hours: Temp:  [97.4 F (36.3 C)-99.1 F (37.3 C)] 98.3 F (36.8 C) (02/08 0626) Pulse Rate:  [69-89] 87 (02/08 0626) Resp:  [11-16] 16 (02/08 0626) BP: (126-151)/(78-102) 143/92 mmHg (02/08 0626) SpO2:  [95 %-100 %] 97 % (02/08 0626) Weight change:    PE:  No distress  Heart regular rhythm no murmurs  Lungs clear  Abdomen: Bowel sounds normal, soft, nontender at this time.  Lab Results: No results found for this or any previous visit (from the past 24 hour(s)).  Studies/Results: No results found.    Assessment: Colitis. Suspect infectious in etiology. Biopsies pending  Gallstones. I suspect that this is the source of his intermittent epigastric pain over the past year  Plan: Continue medical management. I think when he is ready for discharge she can follow-up with Dr. Madilyn FiremanHayes in the office and also with surgery.    , F 04/11/2014, 10:14 AM

## 2014-04-11 NOTE — Progress Notes (Signed)
Patient much improved with meds given.

## 2014-04-12 LAB — BASIC METABOLIC PANEL
Anion gap: 7 (ref 5–15)
BUN: 7 mg/dL (ref 6–23)
CO2: 30 mmol/L (ref 19–32)
Calcium: 9.1 mg/dL (ref 8.4–10.5)
Chloride: 101 mmol/L (ref 96–112)
Creatinine, Ser: 1.2 mg/dL (ref 0.50–1.35)
GFR, EST AFRICAN AMERICAN: 85 mL/min — AB (ref >90)
GFR, EST NON AFRICAN AMERICAN: 74 mL/min — AB (ref >90)
Glucose, Bld: 104 mg/dL — ABNORMAL HIGH (ref 70–99)
POTASSIUM: 4 mmol/L (ref 3.5–5.1)
Sodium: 138 mmol/L (ref 135–145)

## 2014-04-12 MED ORDER — ONDANSETRON HCL 4 MG PO TABS: 4.0000 mg | ORAL_TABLET | Freq: Four times a day (QID) | ORAL | Status: DC | PRN

## 2014-04-12 MED ORDER — AMOXICILLIN-POT CLAVULANATE 875-125 MG PO TABS
1.0000 | ORAL_TABLET | Freq: Two times a day (BID) | ORAL | Status: DC
  Administered 2014-04-12: 1 via ORAL
  Filled 2014-04-12 (×2): qty 1

## 2014-04-12 MED ORDER — PANTOPRAZOLE SODIUM 40 MG PO TBEC: 40.0000 mg | DELAYED_RELEASE_TABLET | Freq: Two times a day (BID) | ORAL | Status: DC

## 2014-04-12 MED ORDER — METRONIDAZOLE 500 MG PO TABS
500.0000 mg | ORAL_TABLET | Freq: Three times a day (TID) | ORAL | Status: DC
  Administered 2014-04-12: 500 mg via ORAL
  Filled 2014-04-12 (×3): qty 1

## 2014-04-12 MED ORDER — AMOXICILLIN-POT CLAVULANATE 875-125 MG PO TABS: 1.0000 | ORAL_TABLET | Freq: Two times a day (BID) | ORAL | Status: DC

## 2014-04-12 MED ORDER — METRONIDAZOLE 500 MG PO TABS: 500.0000 mg | ORAL_TABLET | Freq: Three times a day (TID) | ORAL | Status: DC

## 2014-04-12 MED ORDER — OXYCODONE-ACETAMINOPHEN 5-325 MG PO TABS: 1.0000 | ORAL_TABLET | Freq: Four times a day (QID) | ORAL | Status: DC | PRN

## 2014-04-12 NOTE — Progress Notes (Signed)
[facility] Progress Note  Subjective: A little discomfort in lower abdomen. Eating ok.  Objective: Vital signs in last 24 hours: Temp:  [98.2 F (36.8 C)-99 F (37.2 C)] 99 F (37.2 C) (02/09 0634) Pulse Rate:  [70] 70 (02/08 2118) Resp:  [16-18] 18 (02/09 0634) BP: (135-147)/(83-95) 135/92 mmHg (02/09 0634) SpO2:  [97 %-99 %] 97 % (02/09 0634) Weight change:    PE: No distress. Heart RRR Abdomen soft and minimal discomfort lower abdomen.  Lab Results: No results found for this or any previous visit (from the past 24 hour(s)).  Studies/Results: No results found.    Assessment: Colitis. Biopsies pending. Gallstones  Plan: I think he can go home from a GI standpoint and follow up with Dr Madilyn FiremanHayes in office and also the surgeon.    , F 04/12/2014, 9:17 AM

## 2014-04-12 NOTE — Discharge Instructions (Signed)
Colitis Colitis is inflammation of the colon. Colitis can be a short-term or long-standing (chronic) illness. Crohn's disease and ulcerative colitis are 2 types of colitis which are chronic. They usually require lifelong treatment. CAUSES  There are many different causes of colitis, including:  Viruses.  Germs (bacteria).  Medicine reactions. SYMPTOMS   Diarrhea.  Intestinal bleeding.  Pain.  Fever.  Throwing up (vomiting).  Tiredness (fatigue).  Weight loss.  Bowel blockage. DIAGNOSIS  The diagnosis of colitis is based on examination and stool or blood tests. X-rays, CT scan, and colonoscopy may also be needed. TREATMENT  Treatment may include:  Fluids given through the vein (intravenously).  Bowel rest (nothing to eat or drink for a period of time).  Medicine for pain and diarrhea.  Medicines (antibiotics) that kill germs.  Cortisone medicines.  Surgery. HOME CARE INSTRUCTIONS   Get plenty of rest.  Drink enough water and fluids to keep your urine clear or pale yellow.  Eat a well-balanced diet.  Call your caregiver for follow-up as recommended. SEEK IMMEDIATE MEDICAL CARE IF:   You develop chills.  You have an oral temperature above 102 F (38.9 C), not controlled by medicine.  You have extreme weakness, fainting, or dehydration.  You have repeated vomiting.  You develop severe belly (abdominal) pain or are passing bloody or tarry stools. MAKE SURE YOU:   Understand these instructions.  Will watch your condition.  Will get help right away if you are not doing well or get worse. Document Released: 03/28/2004 Document Revised: 05/13/2011 Document Reviewed: 06/23/2009 [facility] Patient Information 2015 [place], [place]. This information is not intended to replace advice given to you by your health care provider. Make sure you discuss any questions you have with your health care provider.   Cholecystitis Cholecystitis is an inflammation of your  gallbladder. It is usually caused by a buildup of gallstones or sludge (cholelithiasis) in your gallbladder. The gallbladder stores a fluid that helps digest fats (bile). Cholecystitis is serious and needs treatment right away.  CAUSES   Gallstones. Gallstones can block the tube that leads to your gallbladder, causing bile to build up. As bile builds up, the gallbladder becomes inflamed.  Bile duct problems, such as blockage from scarring or kinking.  Tumors. Tumors can stop bile from leaving your gallbladder correctly, causing bile to build up. As bile builds up, the gallbladder becomes inflamed. SYMPTOMS   Nausea.  Vomiting.  Abdominal pain, especially in the upper right area of your abdomen.  Abdominal tenderness or bloating.  Sweating.  Chills.  Fever.  Yellowing of the skin and the whites of the eyes (jaundice). DIAGNOSIS  Your caregiver may order blood tests to look for infection or gallbladder problems. Your caregiver may also order imaging tests, such as an ultrasound or computed tomography (CT) scan. Further tests may include a hepatobiliary iminodiacetic acid (HIDA) scan. This scan allows your caregiver to see your bile move from the liver to the gallbladder and to the small intestine. TREATMENT  A hospital stay is usually necessary to lessen the inflammation of your gallbladder. You may be required to not eat or drink (fast) for a certain amount of time. You may be given medicine to treat pain or an antibiotic medicine to treat an infection. Surgery may be needed to remove your gallbladder (cholecystectomy) once the inflammation has gone down. Surgery may be needed right away if you develop complications such as death of gallbladder tissue (gangrene) or a tear (perforation) of the gallbladder.  HOME  CARE INSTRUCTIONS  Home care will depend on your treatment. In general:  If you were given antibiotics, take them as directed. Finish them even if you start to feel  better.  Only take over-the-counter or prescription medicines for pain, discomfort, or fever as directed by your caregiver.  Follow a low-fat diet until you see your caregiver again.  Keep all follow-up visits as directed by your caregiver. SEEK IMMEDIATE MEDICAL CARE IF:   Your pain is increasing and not controlled by medicines.  Your pain moves to another part of your abdomen or to your back.  You have a fever.  You have nausea and vomiting. MAKE SURE YOU:  Understand these instructions.  Will watch your condition.  Will get help right away if you are not doing well or get worse. Document Released: 02/18/2005 Document Revised: 05/13/2011 Document Reviewed: 01/04/2011 [facility] Patient Information 2015 [place]. This information is not intended to replace advice given to you by your health care provider. Make sure you discuss any questions you have with your health care provider.

## 2014-04-12 NOTE — Progress Notes (Signed)
Patient ID: Anthony Gill, male   DOB: [birth year], 44 y.o.   MRN: [id number] 2 Days Post-Op  Subjective: Pt still with a little left sided pain.  States he has some epigastric discomfort about 2am after eating supper at 6pm.  Objective: Vital signs in last 24 hours: Temp:  [98.2 F (36.8 C)-99 F (37.2 C)] 99 F (37.2 C) (02/09 0634) Pulse Rate:  [70] 70 (02/08 2118) Resp:  [16-18] 18 (02/09 0634) BP: (135-147)/(83-95) 135/92 mmHg (02/09 0634) SpO2:  [97 %-99 %] 97 % (02/09 0634) Last BM Date: 04/11/14  Intake/Output from previous day: 02/08 0701 - 02/09 0700 In: 1380 [P.O.:480; I.V.:800; IV Piggyback:100] Out: 1200 [Urine:1200] Intake/Output this shift:    PE: Abd: soft, NT in RUQ.  Some tenderness in LLQ, +Bs, ND  Lab Results:   Recent Labs  04/09/14 1233 04/10/14 0623  WBC 6.4 5.2  HGB 15.7 14.0  HCT 43.6 39.7  PLT 223 199   BMET  Recent Labs  04/10/14 0623  NA 138  K 3.7  CL 105  CO2 27  GLUCOSE 105*  BUN 5*  CREATININE 0.96  CALCIUM 8.7   PT/INR No results for input(s): LABPROT, INR in the last 72 hours. CMP     Component Value Date/Time   NA 138 04/10/2014 0623   K 3.7 04/10/2014 0623   CL 105 04/10/2014 0623   CO2 27 04/10/2014 0623   GLUCOSE 105* 04/10/2014 0623   BUN 5* 04/10/2014 0623   CREATININE 0.96 04/10/2014 0623   CREATININE 1.07 06/09/2013 0400   CALCIUM 8.7 04/10/2014 0623   PROT 5.9* 04/07/2014 0650   ALBUMIN 3.1* 04/07/2014 0650   AST 22 04/07/2014 0650   ALT 30 04/07/2014 0650   ALKPHOS 63 04/07/2014 0650   BILITOT 0.9 04/07/2014 0650   GFRNONAA >90 04/10/2014 0623   GFRAA >90 04/10/2014 0623   Lipase     Component Value Date/Time   LIPASE 26 04/05/2014 0615       Studies/Results: No results found.  Anti-infectives: Anti-infectives    Start     Dose/Rate Route Frequency Ordered Stop   04/11/14 2200  Ampicillin-Sulbactam (UNASYN) 3 g in sodium chloride 0.9 % 100 mL IVPB     3 g100 mL/hr over 60 Minutes  Intravenous Every 6 hours 04/11/14 2152     04/10/14 1045  ciprofloxacin (CIPRO) tablet 500 mg  Status:  Discontinued     500 mg Oral 2 times daily 04/10/14 1040 04/11/14 2152   04/05/14 1400  ciprofloxacin (CIPRO) IVPB 400 mg  Status:  Discontinued     400 mg200 mL/hr over 60 Minutes Intravenous Every 12 hours 04/05/14 1327 04/10/14 1040   04/05/14 1400  metroNIDAZOLE (FLAGYL) IVPB 500 mg  Status:  Discontinued     500 mg100 mL/hr over 60 Minutes Intravenous Every 12 hours 04/05/14 1327 04/10/14 1040       Assessment/Plan  1. Colitis 2. Chronic cholecystitis  Plan: 1. Long d/w the patient his wife via phone.  We would like for the patient's colitis to resolve prior to cholecystectomy to decrease the risks associated with surgery if we were to operate while he had acute colitis. 2. Follow up arranged with Dr. Andrey CampanileWilson for 04-27-14 to discuss elective resection. 3. Ok for [employer] home from our standpoint.  LOS: 7 days    , E 04/12/2014, 10:53 AM Pager: [phone]

## 2014-04-12 NOTE — Progress Notes (Signed)
Patient discharge teaching given, including activity, diet, follow-up appoints, and medications. Patient and wife verbalized understanding of all discharge instructions. IV access was d/c'd. Vitals are stable. Skin is intact except as charted in most recent assessments. Pt to be escorted out by volunteers, to be driven home by family.

## 2014-04-12 NOTE — Progress Notes (Signed)
Shift Event: Pt c/o's of extreme itching with ciproflaxacin, and states wont tolerate it despite giving him Benadryl. Cipro changed to Unasyn per pharmacy.   Illa LevelSahar  [facility]

## 2014-04-12 NOTE — Discharge Summary (Signed)
Physician Discharge Summary  NAMEER SUMMER Gill:[id number] DOB: Jul 28, 1970 DOA: 04/05/2014  PCP: Piedad Climes, PA-C  Admit date: 04/05/2014 Discharge date: 04/12/2014  Time spent: 35 minutes.  minutes  Recommendations for Outpatient Follow-up:  Follow up with surgery for elective cholecystectomy.  Follow up with DR Madilyn Fireman for results of biopsy  Discharge Diagnoses:    Acute colitis   GERD (gastroesophageal reflux disease)   Acute abdominal pain   Cholelithiasis   Discharge Condition: stable.   Diet recommendation: Heart Healthy  Filed Weights   04/05/14 0610  Weight: 89.812 kg (198 lb)    History of present illness:  Anthony Gill is a 44 y.o. male with a Past Medical History of GERD,who presents today with the above noted complaint. Per patient, he was in his usual state of health until yesterday afternoon when he started developing upper and mid abdominal pain. Patient describes the pain as crampy, 10/10 it is worse without any radiation. This was associated with numerous episodes of vomiting and diarrhea. Vomitus is nonbloody, patient claims to have vomited around 10-15 times since yesterday. He also has loose watery stools mixed with blood. Because of persistent symptoms and worsening pain, patient presented to the emergency room for further evaluation and treatment. A x-ray of the abdomen was negative for acute abnormalities, I was subsequently asked to admit this patient for further evaluation and treatment Patient denied any fever, chest pain, shortness of breath, headache or dysuria.  Hospital Course:   1-Acute colitis Continue with Cipro and Flagyl. Day 6. He develops itching with cipro ???.  His antibiotics was change to Augmentin and flagyl will provide 7 more days.  C. difficile PCR is negative, rest of GI pathogen panel negative On soft diet  S/P Endoscopy which only show small hiatal hernia. Colonoscopy: very mild appearing colitis of the transverse and  descending colon consistent with resolving infection. Await biopsy.  Pain tolerable with percocet. Tolerating diet.   Cholelithiasis RUQ ultrasound showed nearly full gallbladder with small stones. HIDA: Nonvisualization of the gallbladder consistent with obstruction ofthe cystic duct and acute cholecystitis. Discussed HIDA scan results with Dr Abbey Chatters, patient does not have clinical cholecystitis.  Patient S/P endoscopy/colonoscopy.  Surgery recommending cholecystectomy out patient   History of positive H. Pylori antibody; Patient positive for H. pylori with symptoms of PUD. Patient treated for H. pylori/ Continue PPI. S/P endoscopy, which was negative for ulcer. Biopsy pending.  Helicobacter screen negative.   Hypokalemia; resolved.   Procedures:  Endoscopy/colonoscopy  Consultations:  Surgery   Dr Madilyn Fireman  Discharge Exam: Filed Vitals:   04/12/14 0634  BP: 135/92  Pulse:   Temp: 99 F (37.2 C)  Resp: 18    General: Alert in no distress  Cardiovascular: S 1, S 2 RRR Respiratory: CTA  Discharge Instructions   Discharge Instructions    Increase activity slowly    Complete by:  As directed           Current Discharge Medication List    START taking these medications   Details  amoxicillin-clavulanate (AUGMENTIN) 875-125 MG per tablet Take 1 tablet by mouth every 12 (twelve) hours. Qty: 14 tablet, Refills: 0    metroNIDAZOLE (FLAGYL) 500 MG tablet Take 1 tablet (500 mg total) by mouth every 8 (eight) hours. Qty: 21 tablet, Refills: 0    ondansetron (ZOFRAN) 4 MG tablet Take 1 tablet (4 mg total) by mouth every 6 (six) hours as needed for nausea. Qty: 20 tablet, Refills: 0  oxyCODONE-acetaminophen (PERCOCET/ROXICET) 5-325 MG per tablet Take 1-2 tablets by mouth every 6 (six) hours as needed for moderate pain. Qty: 45 tablet, Refills: 0    pantoprazole (PROTONIX) 40 MG tablet Take 1 tablet (40 mg total) by mouth 2 (two) times daily. Qty: 60  tablet, Refills: 0      STOP taking these medications     omeprazole (PRILOSEC) 20 MG capsule      sucralfate (CARAFATE) 1 G tablet      traMADol (ULTRAM) 50 MG tablet      COLCRYS 0.6 MG tablet        Allergies  Allergen Reactions  . Ciprofloxacin Itching   Follow-up Information    Follow up with Atilano Ina, MD On 04/27/2014.   Specialty:  General Surgery   Why:  9:45am, arrive no later than 9:15am for paperwork   Contact information:   [address] [place] [address] [phone]       Follow up with Piedad Climes, PA-C.   Specialty:  Physician Assistant   Contact information:   [phone] [address] [place] [address] [phone]        The results of significant diagnostics from this hospitalization (including imaging, microbiology, ancillary and laboratory) are listed below for reference.    Significant Diagnostic Studies: Nm Hepatobiliary Including Gb  04/09/2014   CLINICAL DATA:  44 year old male with right upper quadrant pain concerning for acute cholecystitis  EXAM: NUCLEAR MEDICINE HEPATOBILIARY IMAGING  TECHNIQUE: Sequential images of the abdomen were obtained out to 60 minutes following intravenous administration of radiopharmaceutical. Additionally, 3.6 mg morphine was administered after 60 min of nonvisualization of the gallbladder.  RADIOPHARMACEUTICALS:  Five [employer] Tc-22m Choletec.  COMPARISON:  Prior CT abdomen/ pelvis and abdominal ultrasound 04/05/2014  FINDINGS: Normal hepatic uptake of the radiotracer. Contrast material can be seen within the intrahepatic bile ducts and within the proximal duodenum by 10 min. At 60 min, the gallbladder remains non visualized.  Additional imaging following administration of morphine again demonstrates nonvisualization of the gallbladder.  IMPRESSION: Nonvisualization of the gallbladder consistent with obstruction of the cystic duct and acute cholecystitis.   Electronically Signed    By: Malachy Moan M.D.   On: 04/09/2014 10:47   US Abdomen Complete  04/05/2014   CLINICAL DATA:  Gallstones, colitis, abdominal pain  EXAM: ULTRASOUND ABDOMEN COMPLETE  COMPARISON:  CT same date  FINDINGS: Gallbladder: Multiple gallstones are identified nearly filling the gallbladder, largest 2.3 cm. Gallbladder wall thickness measures 3 mm maximally. No pericholecystic fluid or sonographic Eulah Pont sign is identified.  Common bile duct: Diameter: 3 mm  Liver: Inhomogeneous, no focal abnormality or intrahepatic ductal dilatation  IVC: No abnormality visualized.  Pancreas: Visualized portion unremarkable.  Spleen: Size and appearance within normal limits.  Right Kidney: Length: 10.5 cm. Echogenicity within normal limits. No mass or hydronephrosis visualized.  Left Kidney: Length: 10.6 cm. Echogenicity within normal limits. No mass or hydronephrosis visualized.  Abdominal aorta: No aneurysm visualized.  Other findings: None.  IMPRESSION: Nearly stone filled gallbladder with wall thickness at upper limits of normal which could indicate an element of chronic cholecystitis but no pericholecystic fluid or sonographic Eulah Pont sign is identified.   Electronically Signed   By: Christiana Pellant M.D.   On: 04/05/2014 19:43   Ct Abdomen Pelvis W Contrast  04/05/2014   CLINICAL DATA:  Generalized abdominal pain.  EXAM: CT ABDOMEN AND PELVIS WITH CONTRAST  TECHNIQUE: Multidetector CT imaging of the  abdomen and pelvis was performed using the standard protocol following bolus administration of intravenous contrast. Oral contrast was also administered.  CONTRAST:  100mL OMNIPAQUE IOHEXOL 300 MG/ML  SOLN  COMPARISON:  None.  FINDINGS: There is mild bibasilar lung atelectasis. There is mild bibasilar lung bronchiectatic change.  Liver is prominent, measuring 19.8 cm in length. No focal liver lesions are identified. There is cholelithiasis. There is slight gallbladder wall thickening. No pericholecystic fluid is seen. There is no  appreciable biliary duct dilatation.  Spleen, pancreas, and adrenals appear normal. Kidneys bilaterally show no hydronephrosis on either side. There is a cyst in the mid right kidney measuring 1.2 x 0.5 cm. No other renal masses are identified. There is no renal or ureteral calculus on either side.  In the pelvis, the urinary bladder is decompressed. There is no pelvic mass or pelvic fluid collection. The rectum is filled with fluid but not appreciably distended. Appendix appears normal. There is fat in the right inguinal ring.  There is a rather minimal ventral hernia containing only fat.  There is thickening of the wall of the colon from the cecum to the upper sigmoid colon. There is surrounding mesenteric thickening throughout much of this region. There is no abscess or evidence suggesting perforation. There is some mildly dilated small bowel near the area of thickening in the colonic inflammation in the upper pelvis just to the left of midline. There is no other small bowel wall thickening. There is no bowel obstruction. No free air or portal venous air. Terminal ileum appears normal.  There is no ascites, adenopathy, or abscess in the abdomen or pelvis. There is no abdominal aortic aneurysm. There are no blastic or lytic bone lesions.  IMPRESSION: Extensive colitis with sparing of most of the sigmoid colon. Suspect infectious or inflammatory etiology is most likely given this overall appearance. Sparing of the terminal ileum and most of the sigmoid colon makes inflammatory bowel disease a less likely differential consideration.  There are gallstones with slight gallbladder wall thickening. A degree of cholecystitis must be of concern given this appearance. This finding may warrant ultrasound of the gallbladder to further assess.  Prominent liver without focal lesions.  No renal or ureteral calculus.  No hydronephrosis.  No bowel obstruction. No abscess. Appendix appears normal. Minimal ventral hernia containing  only fat.   Electronically Signed   By: Bretta BangWilliam  Woodruff M.D.   On: 04/05/2014 13:19   Dg Abd Acute W/chest  04/05/2014   CLINICAL DATA:  Central chest pain and sharp epigastric pain starting yesterday.  EXAM: ACUTE ABDOMEN SERIES (ABDOMEN 2 VIEW & CHEST 1 VIEW)  COMPARISON:  11/05/2012  FINDINGS: The abdomen is relatively gasless, which decreases the sensitivity of radiography for detecting obstruction. There is no indication of bowel obstruction or perforation. No concerning intra-abdominal mass effect or calcification.  Normal heart size and mediastinal contours. No acute infiltrate or edema. No effusion or pneumothorax. No acute osseous findings.  IMPRESSION: 1. Essentially gasless abdomen with no evidence of bowel obstruction or perforation. 2. Negative chest.   Electronically Signed   By: Tiburcio PeaJonathan  Watts M.D.   On: 04/05/2014 08:01    Microbiology: Recent Results (from the past 240 hour(s))  Clostridium Difficile by PCR     Status: None   Collection Time: 04/05/14 10:52 AM  Result Value Ref Range Status   C difficile by pcr NEGATIVE NEGATIVE Final  Stool culture     Status: None   Collection Time: 04/05/14  6:57 PM  Result Value Ref Range Status   Specimen Description STOOL  Final   Special Requests NONE  Final   Culture   Final    NO SALMONELLA, SHIGELLA, CAMPYLOBACTER, YERSINIA, OR E.COLI [id number] ISOLATED Note: REDUCED NORMAL FLORA PRESENT Performed at [facility]    Report Status 04/09/2014 FINAL  Final     Labs: Basic Metabolic Panel:  Recent Labs Lab 04/06/14 0722 04/07/14 0650 04/08/14 0615 04/10/14 0623  NA 135 138 138 138  K 3.6 3.5 3.2* 3.7  CL 101 105 104 105  CO2 GLUCOSE 115* 102* 147* 105*  BUN 7 6 <5* 5*  CREATININE 0.89 0.95 1.02 0.96  CALCIUM 7.8* 8.2* 8.4 8.7   Liver Function Tests:  Recent Labs Lab 04/07/14 0650  AST 22  ALT 30  ALKPHOS 63  BILITOT 0.9  PROT 5.9*  ALBUMIN 3.1*   No results for input(s): LIPASE,  AMYLASE in the last 168 hours. No results for input(s): AMMONIA in the last 168 hours. CBC:  Recent Labs Lab 04/06/14 0722 04/07/14 0650 04/08/14 0615 04/09/14 1233 04/10/14 0623  WBC 12.6* 10.4 8.3 6.4 5.2  HGB 15.6 14.2 13.8 15.7 14.0  HCT 43.2 40.4 39.3 43.6 39.7  MCV 85.7 84.9 85.1 83.5 84.5  PLT 138* 138* 147* 223 199   Cardiac Enzymes: No results for input(s): CKTOTAL, CKMB, CKMBINDEX, TROPONINI in the last 168 hours. BNP: BNP (last 3 results)  Recent Labs  04/05/14 0615  BNP 12.3    ProBNP (last 3 results) No results for input(s): PROBNP in the last 8760 hours.  CBG: No results for input(s): GLUCAP in the last 168 hours.     SignedHartley Barefoot A  [facility] 04/12/2014, 11:11 AM

## 2014-04-26 ENCOUNTER — Telehealth: Payer: Self-pay | Admitting: Physician Assistant

## 2014-04-26 MED ORDER — ONDANSETRON HCL 4 MG PO TABS: 4.0000 mg | ORAL_TABLET | Freq: Four times a day (QID) | ORAL | Status: DC | PRN

## 2014-04-26 NOTE — Telephone Encounter (Signed)
He needs a hospital follow-up before narcotics will be given. I am unsure what medication he is even talking about since I have not seen him since hospitalization. I have sent refill of the Zofran to his pharmacy.

## 2014-04-26 NOTE — Telephone Encounter (Signed)
Spoke with patient who wants more Percocet & Zofran, as he states that the Hospital told him to "call our office and ask for refills and he could get them through us"; he has not seen Gastroenterology as of yet, reports he has appointment next week/SLS Please Advise.

## 2014-04-26 NOTE — Telephone Encounter (Signed)
Caller name: Anthony Gill Relation to pt: self Call back number: [phone] Pharmacy:  Reason for call:   Please call patient. He states that the medication is not working(does not know name) and is refusing to schedule appointment.

## 2014-04-27 NOTE — Telephone Encounter (Signed)
Spoke with PT- scheduled hospital follow up for Friday 2/26

## 2014-04-27 NOTE — Telephone Encounter (Signed)
Pt stated he can not keep taking off work and does not know what the zofran is for. In need of clinical advice.

## 2014-04-29 ENCOUNTER — Encounter: Payer: Self-pay | Admitting: *Deleted

## 2014-04-29 ENCOUNTER — Ambulatory Visit (INDEPENDENT_AMBULATORY_CARE_PROVIDER_SITE_OTHER): Payer: BLUE CROSS/BLUE SHIELD | Admitting: Physician Assistant

## 2014-04-29 ENCOUNTER — Encounter: Payer: Self-pay | Admitting: Physician Assistant

## 2014-04-29 VITALS — BP 139/91 | HR 84 | Temp 98.4°F | Resp 16 | Ht 67.0 in | Wt 193.5 lb

## 2014-04-29 DIAGNOSIS — K8020 Calculus of gallbladder without cholecystitis without obstruction: Secondary | ICD-10-CM

## 2014-04-29 DIAGNOSIS — K219 Gastro-esophageal reflux disease without esophagitis: Secondary | ICD-10-CM

## 2014-04-29 DIAGNOSIS — K279 Peptic ulcer, site unspecified, unspecified as acute or chronic, without hemorrhage or perforation: Secondary | ICD-10-CM

## 2014-04-29 DIAGNOSIS — K529 Noninfective gastroenteritis and colitis, unspecified: Secondary | ICD-10-CM

## 2014-04-29 MED ORDER — PANTOPRAZOLE SODIUM 40 MG PO TBEC: 40.0000 mg | DELAYED_RELEASE_TABLET | Freq: Two times a day (BID) | ORAL | Status: DC

## 2014-04-29 MED ORDER — OXYCODONE-ACETAMINOPHEN 5-325 MG PO TABS: 1.0000 | ORAL_TABLET | Freq: Four times a day (QID) | ORAL | Status: DC | PRN

## 2014-04-29 NOTE — Progress Notes (Signed)
Pre visit review using our clinic review tool, if applicable. No additional management support is needed unless otherwise documented below in the visit note/SLS  

## 2014-04-29 NOTE — Patient Instructions (Signed)
Please continue the Protonix and pain medication as directed.   Please stay well hydrated but continue to limit fried or fatty foods.  Follow-up with your surgeon as scheduled.  If at any time you notice severe abdominal pain, N/V or a recurrence of blood in stool, please go to the ER as scheduled.

## 2014-05-02 NOTE — Assessment & Plan Note (Signed)
Doing well on Protonix. Medication refilled. 

## 2014-05-02 NOTE — Assessment & Plan Note (Signed)
EGD negative for ulceration.  Biopsy negative for H. Pylori. Continue PPI for GERD.  Supportive measures discussed.  Follow-up with GI as scheduled.

## 2014-05-02 NOTE — Assessment & Plan Note (Signed)
Resolved.  Examination within normal limits.  Continue soft diet until GI follow-up. Continue PPI.  Pain medication refilled.

## 2014-05-02 NOTE — Progress Notes (Signed)
Patient presents to clinic today for hospital follow-up. Patient admitted to hospital on 04/05/14 after presenting to ER with symptoms of GERD with severe emesis and epigastric pain. Patient diagnosed with acute colitis, treated with Augmentin and Flagyl, Cholelithiasis with obstructive cholecystitis, and GERD.  Patient was discharged with instructions to continue his Protonix as directed. Has followed up with Surgery.  Is waiting on a call to schedule his cholecystectomy.  Denies recurrent abdominal pain.  Denies nausea, vomiting tenesmus, melena or hematochezia.  Gastric biopsy negative for any residual h. Pylori.  Past Medical History  Diagnosis Date  . Gout   . 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 Tomi Likens).  MRI brain ordered.  . Migraines   . Chicken pox   . Epigastric pain 04/05/2014    Current Outpatient Prescriptions on File Prior to Visit  Medication Sig Dispense Refill  . ondansetron (ZOFRAN) 4 MG tablet Take 1 tablet (4 mg total) by mouth every 6 (six) hours as needed for nausea. 20 tablet 0   No current facility-administered medications on file prior to visit.    Allergies  Allergen Reactions  . Ciprofloxacin Itching    Family History  Problem Relation Age of Onset  . Diabetes Mother     Living  . Hypertension      family history  . Prostate cancer Father     family history  . Heart disease Paternal Uncle   . Heart disease Father   . Heart attack Father 26    Deceased  . Diabetes Sister     x3  . Cancer Other     Aunts & Uncles  . Cancer Other     Grandparents  . Heart attack Paternal Uncle   . Hyperlipidemia Mother   . Hyperlipidemia Father   . Hyperlipidemia Sister   . Diabetes Brother     x1  . Hyperlipidemia Brother     x1    History   Social History  . Marital Status: Married    Spouse Name: N/A  . Number of Children: N/A  . Years of Education: N/A   Social History Main Topics  .  Smoking status: Former Smoker -- 3 years    Quit date: 03/04/2014  . Smokeless tobacco: Never Used  . Alcohol Use: Yes     Comment: social  . Drug Use: Yes     Comment: marijuana rare  . Sexual Activity: Yes    Birth Control/ Protection: None     Comment: married   Other Topics Concern  . 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   Grew up in [place]   Married   1 son    Review of Systems - See HPI.  All other ROS are negative.  BP 139/91 mmHg  Pulse 84  Temp(Src) 98.4 F (36.9 C) (Oral)  Resp 16  Ht 5' 7" (1.702 m)  Wt 193 lb 8 oz (87.771 kg)  BMI 30.30 kg/m2  SpO2 99%  Physical Exam  Constitutional: He is oriented to person, place, and time and well-developed, well-nourished, and in no distress.  HENT:  Head: Normocephalic and atraumatic.  Eyes: Conjunctivae are normal.  Cardiovascular: Normal rate, regular rhythm, normal heart sounds and intact distal pulses.   Pulmonary/Chest: Effort normal and breath sounds normal. No respiratory distress. He has no wheezes. He has no rales. He exhibits no tenderness.  Abdominal: Soft.  Bowel sounds are normal. He exhibits no distension and no mass. There is no tenderness. There is no rebound and no guarding.  Neurological: He is alert and oriented to person, place, and time.  Skin: Skin is warm and dry. No rash noted.  Psychiatric: Affect normal.  Vitals reviewed.   Recent Results (from the past 2160 hour(s))  CBC     Status: None   Collection Time: 03/08/14 10:43 AM  Result Value Ref Range   WBC 6.5 4.0 - 10.5 K/uL   RBC 5.08 4.22 - 5.81 Mil/uL   Platelets 193.0 150.0 - 400.0 K/uL   Hemoglobin 15.2 13.0 - 17.0 g/dL   HCT 44.5 39.0 - 52.0 %   MCV 87.5 78.0 - 100.0 fl   MCHC 34.3 30.0 - 36.0 g/dL   RDW 13.8 11.5 - 15.5 %  Comp Met (CMET)     Status: Abnormal   Collection Time: 03/08/14 10:43 AM  Result Value Ref Range   Sodium 137 135 - 145 mEq/L   Potassium 3.6 3.5 - 5.1  mEq/L   Chloride 102 96 - 112 mEq/L   CO2 30 19 - 32 mEq/L   Glucose, Bld 111 (H) 70 - 99 mg/dL   BUN 10 6 - 23 mg/dL   Creatinine, Ser 1.2 0.4 - 1.5 mg/dL   Total Bilirubin 0.4 0.2 - 1.2 mg/dL   Alkaline Phosphatase 68 39 - 117 U/L   AST 21 0 - 37 U/L   ALT 22 0 - 53 U/L   Total Protein 7.4 6.0 - 8.3 g/dL   Albumin 4.2 3.5 - 5.2 g/dL   Calcium 9.1 8.4 - 10.5 mg/dL   GFR 83.95 >60.00 mL/min  Lipase     Status: None   Collection Time: 03/08/14 10:43 AM  Result Value Ref Range   Lipase 27.0 11.0 - 59.0 U/L  H. pylori antibody, IgG     Status: Abnormal   Collection Time: 03/08/14 10:43 AM  Result Value Ref Range   H Pylori IgG Positive (A) Negative  CBC     Status: None   Collection Time: 03/22/14 10:05 AM  Result Value Ref Range   WBC 4.8 4.0 - 10.5 K/uL   RBC 4.98 4.22 - 5.81 Mil/uL   Platelets 213.0 150.0 - 400.0 K/uL   Hemoglobin 14.9 13.0 - 17.0 g/dL   HCT 44.0 39.0 - 52.0 %   MCV 88.4 78.0 - 100.0 fl   MCHC 33.8 30.0 - 36.0 g/dL   RDW 13.6 11.5 - 15.5 %  Comp Met (CMET)     Status: Abnormal   Collection Time: 03/22/14 10:05 AM  Result Value Ref Range   Sodium 138 135 - 145 mEq/L   Potassium 3.6 3.5 - 5.1 mEq/L   Chloride 103 96 - 112 mEq/L   CO2 33 (H) 19 - 32 mEq/L   Glucose, Bld 98 70 - 99 mg/dL   BUN 9 6 - 23 mg/dL   Creatinine, Ser 1.12 0.40 - 1.50 mg/dL   Total Bilirubin 0.4 0.2 - 1.2 mg/dL   Alkaline Phosphatase 66 39 - 117 U/L   AST 22 0 - 37 U/L   ALT 16 0 - 53 U/L   Total Protein 7.2 6.0 - 8.3 g/dL   Albumin 4.2 3.5 - 5.2 g/dL   Calcium 9.3 8.4 - 10.5 mg/dL   GFR 92.64 >60.00 mL/min  CBC with Differential/Platelet     Status: Abnormal   Collection Time: 04/05/14  6:15 AM  Result  Value Ref Range   WBC 10.2 4.0 - 10.5 K/uL   RBC 5.60 4.22 - 5.81 MIL/uL   Hemoglobin 17.5 (H) 13.0 - 17.0 g/dL   HCT 47.6 39.0 - 52.0 %   MCV 85.0 78.0 - 100.0 fL   MCH 31.3 26.0 - 34.0 pg   MCHC 36.8 (H) 30.0 - 36.0 g/dL   RDW 12.8 11.5 - 15.5 %   Platelets 179 150 -  400 K/uL   Neutrophils Relative % 88 (H) 43 - 77 %   Lymphocytes Relative 9 (L) 12 - 46 %   Monocytes Relative 3 3 - 12 %   Eosinophils Relative 0 0 - 5 %   Basophils Relative 0 0 - 1 %   Neutro Abs 9.0 (H) 1.7 - 7.7 K/uL   Lymphs Abs 0.9 0.7 - 4.0 K/uL   Monocytes Absolute 0.3 0.1 - 1.0 K/uL   Eosinophils Absolute 0.0 0.0 - 0.7 K/uL   Basophils Absolute 0.0 0.0 - 0.1 K/uL   Smear Review MORPHOLOGY UNREMARKABLE   Comprehensive metabolic panel     Status: Abnormal   Collection Time: 04/05/14  6:15 AM  Result Value Ref Range   Sodium 136 135 - 145 mmol/L   Potassium 3.9 3.5 - 5.1 mmol/L   Chloride 99 96 - 112 mmol/L   CO2 29 19 - 32 mmol/L   Glucose, Bld 157 (H) 70 - 99 mg/dL   BUN 8 6 - 23 mg/dL   Creatinine, Ser 1.08 0.50 - 1.35 mg/dL   Calcium 9.1 8.4 - 10.5 mg/dL   Total Protein 8.0 6.0 - 8.3 g/dL   Albumin 4.7 3.5 - 5.2 g/dL   AST 37 0 - 37 U/L   ALT 35 0 - 53 U/L   Alkaline Phosphatase 79 39 - 117 U/L   Total Bilirubin 0.8 0.3 - 1.2 mg/dL   GFR calc non Af Amer 84 (L) >90 mL/min   GFR calc Af Amer >90 >90 mL/min    Comment: (NOTE) The eGFR has been calculated using the CKD EPI equation. This calculation has not been validated in all clinical situations. eGFR's persistently <90 mL/min signify possible Chronic Kidney Disease.    Anion gap 8 5 - 15  Troponin I     Status: None   Collection Time: 04/05/14  6:15 AM  Result Value Ref Range   Troponin I <0.03 <0.031 ng/mL    Comment:        NO INDICATION OF MYOCARDIAL INJURY.   Brain natriuretic peptide     Status: None   Collection Time: 04/05/14  6:15 AM  Result Value Ref Range   B Natriuretic Peptide 12.3 0.0 - 100.0 pg/mL  Lipase, blood     Status: None   Collection Time: 04/05/14  6:15 AM  Result Value Ref Range   Lipase 26 11 - 59 U/L  POC occult blood, ED     Status: Abnormal   Collection Time: 04/05/14  7:10 AM  Result Value Ref Range   Fecal Occult Bld POSITIVE (A) NEGATIVE  Type and screen for Red  Blood Exchange     Status: None   Collection Time: 04/05/14  7:30 AM  Result Value Ref Range   ABO/RH(D) O POS    Antibody Screen NEG    Sample Expiration 04/08/2014   ABO/Rh     Status: None   Collection Time: 04/05/14  7:30 AM  Result Value Ref Range   ABO/RH(D) O POS   GI pathogen  panel by PCR, stool     Status: None   Collection Time: 04/05/14 10:52 AM  Result Value Ref Range   Campylobacter by PCR Not Detected    C difficile toxin A/B Not Detected    E coli 0157 by PCR Not Detected    E coli (ETEC) LT/ST Not Detected    E coli (STEC) Not Detected    Salmonella by PCR Not Detected    Shigella by PCR Not Detected     Comment: (NOTE) Reference value for all analytes:Not Detected The results of this test should not be used as the sole basis for diagnosis, treatment, or other patient management decisions.xTAG(R) GPP positive results are presumptive and must be confirmed by FDA-cleared tests or other acceptable reference methods.Confirmed positive results do not rule out co-infection with other organisms that are not detected by this test, and may not be the sole or definitive cause of patient illness.Negative xTAG(R) Gastrointestinal Pathogen Panel results in the setting of clinical illness compatible with gastroenteritis may be due to infection by pathogens that are not detected by this test or non-infectious causes such as ulcerative colitis, irritable bowel syndrome, or Crohn's disease.xTAG GPP is not intended to monitor or guide treatment for C. difficile infections. Performed At: [facility] [address] [place], [place] [id number] Altrich Michelle L PhD  Ph:[phone]    Norovirus G!/G2 Not Detected    Rotavirus A by PCR Not Detected    G lamblia by PCR Not Detected    Cryptosporidium by PCR Not Detected   Clostridium Difficile by PCR     Status: None   Collection Time: 04/05/14 10:52 AM  Result Value Ref Range   C difficile by  pcr NEGATIVE NEGATIVE  Urine rapid drug screen (hosp performed)     Status: Abnormal   Collection Time: 04/05/14  6:57 PM  Result Value Ref Range   Opiates POSITIVE (A) NONE DETECTED    Comment: DONE AT 04/06/14 0405 E.GADDY   Cocaine NONE DETECTED NONE DETECTED    Comment: DONE AT 04/06/14 0405 E.GADDY   Benzodiazepines NONE DETECTED NONE DETECTED    Comment: DONE AT 04/06/14 0405 E.GADDY   Amphetamines NONE DETECTED NONE DETECTED    Comment: DONE AT 04/06/14 0405 E.GADDY   Tetrahydrocannabinol POSITIVE (A) NONE DETECTED    Comment: DONE AT 04/06/14 0405 E.GADDY   Barbiturates NONE DETECTED NONE DETECTED    Comment:        DRUG SCREEN FOR MEDICAL PURPOSES ONLY.  IF CONFIRMATION IS NEEDED FOR ANY PURPOSE, NOTIFY LAB WITHIN 5 DAYS.        LOWEST DETECTABLE LIMITS FOR URINE DRUG SCREEN Drug Class       Cutoff (ng/mL) Amphetamine      1000 Barbiturate      200 Benzodiazepine   200 Tricyclics       300 Opiates          300 Cocaine          300 THC              50 DONE AT 04/06/14 0405 E.GADDY   Stool culture     Status: None   Collection Time: 04/05/14  6:57 PM  Result Value Ref Range   Specimen Description STOOL    Special Requests NONE    Culture      NO SALMONELLA, SHIGELLA, CAMPYLOBACTER, YERSINIA, OR E.COLI 0157:H7 ISOLATED Note: REDUCED NORMAL FLORA PRESENT Performed at [facility]      Report Status 04/09/2014 FINAL   Drugs of abuse screen w/o alc, rtn urine-sln     Status: None   Collection Time: 04/05/14  6:57 PM  Result Value Ref Range   Marijuana Metabolite NEGATIVE Negative   Amphetamine Screen, Ur NEGATIVE Negative   Barbiturate Quant, Ur NEGATIVE Negative   Methadone NEGATIVE Negative   Benzodiazepines. NEGATIVE Negative   Phencyclidine (PCP) NEGATIVE Negative   Cocaine Metabolites NEGATIVE Negative   Opiate Screen, Urine NEGATIVE Negative   Propoxyphene NEGATIVE Negative   Creatinine,U 145.2 mg/dL    Comment: (NOTE) Cutoff Values for Urine Drug  Screen:        Drug Class           Cutoff (ng/mL)        Amphetamines            1000        Barbiturates             200        Cocaine Metabolites      300        Benzodiazepines          200        Methadone                300        Opiates                 2000        Phencyclidine             25        Propoxyphene             300        Marijuana Metabolites     50 For medical purposes only. Performed at [facility]   Basic metabolic panel     Status: Abnormal   Collection Time: 04/06/14  7:22 AM  Result Value Ref Range   Sodium 135 135 - 145 mmol/L   Potassium 3.6 3.5 - 5.1 mmol/L   Chloride 101 96 - 112 mmol/L   CO2 27 19 - 32 mmol/L   Glucose, Bld 115 (H) 70 - 99 mg/dL   BUN 7 6 - 23 mg/dL   Creatinine, Ser 0.89 0.50 - 1.35 mg/dL   Calcium 7.8 (L) 8.4 - 10.5 mg/dL   GFR calc non Af Amer >90 >90 mL/min   GFR calc Af Amer >90 >90 mL/min    Comment: (NOTE) The eGFR has been calculated using the CKD EPI equation. This calculation has not been validated in all clinical situations. eGFR's persistently <90 mL/min signify possible Chronic Kidney Disease.    Anion gap 7 5 - 15  CBC     Status: Abnormal   Collection Time: 04/06/14  7:22 AM  Result Value Ref Range   WBC 12.6 (H) 4.0 - 10.5 K/uL   RBC 5.04 4.22 - 5.81 MIL/uL   Hemoglobin 15.6 13.0 - 17.0 g/dL   HCT 43.2 39.0 - 52.0 %   MCV 85.7 78.0 - 100.0 fL   MCH 31.0 26.0 - 34.0 pg   MCHC 36.1 (H) 30.0 - 36.0 g/dL   RDW 12.9 11.5 - 15.5 %   Platelets 138 (L) 150 - 400 K/uL  Comprehensive metabolic panel     Status: Abnormal   Collection Time: 04/07/14  6:50 AM  Result Value Ref Range   Sodium 138 135 - 145 mmol/L   Potassium 3.5 3.5 -   5.1 mmol/L   Chloride 105 96 - 112 mmol/L   CO2 24 19 - 32 mmol/L   Glucose, Bld 102 (H) 70 - 99 mg/dL   BUN 6 6 - 23 mg/dL   Creatinine, Ser 0.95 0.50 - 1.35 mg/dL   Calcium 8.2 (L) 8.4 - 10.5 mg/dL   Total Protein 5.9 (L) 6.0 - 8.3 g/dL   Albumin 3.1 (L) 3.5 - 5.2  g/dL   AST 22 0 - 37 U/L   ALT 30 0 - 53 U/L   Alkaline Phosphatase 63 39 - 117 U/L   Total Bilirubin 0.9 0.3 - 1.2 mg/dL   GFR calc non Af Amer >90 >90 mL/min   GFR calc Af Amer >90 >90 mL/min    Comment: (NOTE) The eGFR has been calculated using the CKD EPI equation. This calculation has not been validated in all clinical situations. eGFR's persistently <90 mL/min signify possible Chronic Kidney Disease.    Anion gap 9 5 - 15  CBC     Status: Abnormal   Collection Time: 04/07/14  6:50 AM  Result Value Ref Range   WBC 10.4 4.0 - 10.5 K/uL   RBC 4.76 4.22 - 5.81 MIL/uL   Hemoglobin 14.2 13.0 - 17.0 g/dL   HCT 40.4 39.0 - 52.0 %   MCV 84.9 78.0 - 100.0 fL   MCH 29.8 26.0 - 34.0 pg   MCHC 35.1 30.0 - 36.0 g/dL   RDW 12.6 11.5 - 15.5 %   Platelets 138 (L) 150 - 400 K/uL  Basic metabolic panel     Status: Abnormal   Collection Time: 04/08/14  6:15 AM  Result Value Ref Range   Sodium 138 135 - 145 mmol/L   Potassium 3.2 (L) 3.5 - 5.1 mmol/L   Chloride 104 96 - 112 mmol/L   CO2 27 19 - 32 mmol/L   Glucose, Bld 147 (H) 70 - 99 mg/dL   BUN <5 (L) 6 - 23 mg/dL   Creatinine, Ser 1.02 0.50 - 1.35 mg/dL   Calcium 8.4 8.4 - 10.5 mg/dL   GFR calc non Af Amer 90 (L) >90 mL/min   GFR calc Af Amer >90 >90 mL/min    Comment: (NOTE) The eGFR has been calculated using the CKD EPI equation. This calculation has not been validated in all clinical situations. eGFR's persistently <90 mL/min signify possible Chronic Kidney Disease.    Anion gap 7 5 - 15  CBC     Status: Abnormal   Collection Time: 04/08/14  6:15 AM  Result Value Ref Range   WBC 8.3 4.0 - 10.5 K/uL   RBC 4.62 4.22 - 5.81 MIL/uL   Hemoglobin 13.8 13.0 - 17.0 g/dL   HCT 39.3 39.0 - 52.0 %   MCV 85.1 78.0 - 100.0 fL   MCH 29.9 26.0 - 34.0 pg   MCHC 35.1 30.0 - 36.0 g/dL   RDW 12.7 11.5 - 15.5 %   Platelets 147 (L) 150 - 400 K/uL  CBC     Status: None   Collection Time: 04/09/14 12:33 PM  Result Value Ref Range   WBC  6.4 4.0 - 10.5 K/uL   RBC 5.22 4.22 - 5.81 MIL/uL   Hemoglobin 15.7 13.0 - 17.0 g/dL   HCT 43.6 39.0 - 52.0 %   MCV 83.5 78.0 - 100.0 fL   MCH 30.1 26.0 - 34.0 pg   MCHC 36.0 30.0 - 36.0 g/dL   RDW 12.6 11.5 - 15.5 %     Platelets 223 150 - 400 K/uL  CBC     Status: None   Collection Time: 04/10/14  6:23 AM  Result Value Ref Range   WBC 5.2 4.0 - 10.5 K/uL   RBC 4.70 4.22 - 5.81 MIL/uL   Hemoglobin 14.0 13.0 - 17.0 g/dL   HCT 39.7 39.0 - 52.0 %   MCV 84.5 78.0 - 100.0 fL   MCH 29.8 26.0 - 34.0 pg   MCHC 35.3 30.0 - 36.0 g/dL   RDW 12.5 11.5 - 15.5 %   Platelets 199 150 - 400 K/uL  Basic metabolic panel     Status: Abnormal   Collection Time: 04/10/14  6:23 AM  Result Value Ref Range   Sodium 138 135 - 145 mmol/L   Potassium 3.7 3.5 - 5.1 mmol/L   Chloride 105 96 - 112 mmol/L   CO2 27 19 - 32 mmol/L   Glucose, Bld 105 (H) 70 - 99 mg/dL   BUN 5 (L) 6 - 23 mg/dL   Creatinine, Ser 0.96 0.50 - 1.35 mg/dL   Calcium 8.7 8.4 - 10.5 mg/dL   GFR calc non Af Amer >90 >90 mL/min   GFR calc Af Amer >90 >90 mL/min    Comment: (NOTE) The eGFR has been calculated using the CKD EPI equation. This calculation has not been validated in all clinical situations. eGFR's persistently <90 mL/min signify possible Chronic Kidney Disease.    Anion gap 6 5 - 15  Clotest (H. pylori), biopsy     Status: None   Collection Time: 04/10/14 10:20 AM  Result Value Ref Range   Helicobacter screen UREASE NEGATIVE UREASE NEGATIVE    Comment:        CLOTEST DETECTS THE UREASE ENZYME OF HELICOBACTER PYLORI IN GASTRIC MUCOSAL BIOPSIES.   Basic metabolic panel     Status: Abnormal   Collection Time: 04/12/14  9:30 AM  Result Value Ref Range   Sodium 138 135 - 145 mmol/L   Potassium 4.0 3.5 - 5.1 mmol/L   Chloride 101 96 - 112 mmol/L   CO2 30 19 - 32 mmol/L   Glucose, Bld 104 (H) 70 - 99 mg/dL   BUN 7 6 - 23 mg/dL   Creatinine, Ser 1.20 0.50 - 1.35 mg/dL   Calcium 9.1 8.4 - 10.5 mg/dL   GFR calc non  Af Amer 74 (L) >90 mL/min   GFR calc Af Amer 85 (L) >90 mL/min    Comment: (NOTE) The eGFR has been calculated using the CKD EPI equation. This calculation has not been validated in all clinical situations. eGFR's persistently <90 mL/min signify possible Chronic Kidney Disease.    Anion gap 7 5 - 15    Assessment/Plan: Acute colitis Resolved.  Examination within normal limits.  Continue soft diet until GI follow-up. Continue PPI.  Pain medication refilled.   Cholelithiasis Awaiting laparoscopic cholecystectomy outpatient.  Asymptomatic present.  Continue medications and diet as directed.   GERD (gastroesophageal reflux disease) Doing well on Protonix. Medication refilled.   PUD (peptic ulcer disease) EGD negative for ulceration.  Biopsy negative for H. Pylori. Continue PPI for GERD.  Supportive measures discussed.  Follow-up with GI as scheduled.       

## 2014-05-02 NOTE — Assessment & Plan Note (Signed)
Awaiting laparoscopic cholecystectomy outpatient.  Asymptomatic present.  Continue medications and diet as directed.

## 2014-05-23 ENCOUNTER — Telehealth: Payer: Self-pay | Admitting: Physician Assistant

## 2014-05-23 NOTE — Telephone Encounter (Signed)
Caller name: Selassie Relation to pt: self Call back number: [phone] Pharmacy:  Reason for call:   Requesting a refill of percocet

## 2014-05-24 ENCOUNTER — Other Ambulatory Visit: Payer: Self-pay | Admitting: Physician Assistant

## 2014-05-24 MED ORDER — OXYCODONE-ACETAMINOPHEN 5-325 MG PO TABS: 1.0000 | ORAL_TABLET | Freq: Four times a day (QID) | ORAL | Status: DC | PRN

## 2014-05-24 NOTE — Telephone Encounter (Signed)
Can have 1 additional refill.  I see he canceled his surgery.  I would like to know why he canceled and when he intends to reschedule.  Will not give chronic pain meds for a condition he is not seeking treatment for.  Refill at front desk.  Can be filled on the 25th.

## 2014-05-24 NOTE — Telephone Encounter (Signed)
Medication Detail      Disp Refills Start End     oxyCODONE-acetaminophen (PERCOCET/ROXICET) 5-325 MG per tablet 90 tablet 0 04/29/2014     Sig - Route: Take 1-2 tablets by mouth every 6 (six) hours as needed for moderate pain. - Oral    Class: Print   Pharmacy    [facility] [place] - [address]   Drugs of abuse screen w/o alc, rtn urine-sln  Status: Finalresult Visible to patient:  Not Released Nextappt: None         Ref Range 32mo ago    Marijuana Metabolite Negative  NEGATIVE   Amphetamine Screen, Ur Negative  NEGATIVE   Barbiturate Quant, Ur Negative  NEGATIVE   Methadone Negative  NEGATIVE   Benzodiazepines. Negative  NEGATIVE   Phencyclidine (PCP) Negative  NEGATIVE   Cocaine Metabolites Negative  NEGATIVE   Opiate Screen, Urine Negative  NEGATIVE   Propoxyphene Negative  NEGATIVE   Creatinine,U mg/dL 130.8145.2   Comments: (NOTE)  Cutoff Values for Urine Drug Screen:     Drug Class      Cutoff (ng/mL)     Amphetamines      1000     Barbiturates       200     Cocaine Metabolites   300     Benzodiazepines     200     Methadone        300     Opiates         2000     Phencyclidine       25     Propoxyphene       300     Marijuana Metabolites   50  For medical purposes only.  [facility]formed at [facility]     Resulting Agency [facility]       Specimen Collected: 04/05/14 6:57 PM Last Resulted: 04/06/14 7:56 AM

## 2014-05-24 NOTE — Telephone Encounter (Signed)
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

## 2014-06-10 ENCOUNTER — Ambulatory Visit (HOSPITAL_COMMUNITY)
Admission: RE | Admit: 2014-06-10 | Payer: BLUE CROSS/BLUE SHIELD | Source: Ambulatory Visit | Admitting: General Surgery

## 2014-06-10 ENCOUNTER — Encounter (HOSPITAL_COMMUNITY): Admission: RE | Payer: Self-pay | Source: Ambulatory Visit

## 2014-06-10 SURGERY — LAPAROSCOPIC CHOLECYSTECTOMY WITH INTRAOPERATIVE CHOLANGIOGRAM
Anesthesia: General

## 2014-06-30 ENCOUNTER — Other Ambulatory Visit: Payer: Self-pay | Admitting: *Deleted

## 2014-06-30 MED ORDER — PANTOPRAZOLE SODIUM 40 MG PO TBEC: 40.0000 mg | DELAYED_RELEASE_TABLET | Freq: Two times a day (BID) | ORAL | Status: DC

## 2014-07-11 ENCOUNTER — Other Ambulatory Visit: Payer: Self-pay | Admitting: Physician Assistant

## 2014-07-11 ENCOUNTER — Telehealth: Payer: Self-pay | Admitting: Physician Assistant

## 2014-07-11 MED ORDER — PANTOPRAZOLE SODIUM 40 MG PO TBEC: 40.0000 mg | DELAYED_RELEASE_TABLET | Freq: Two times a day (BID) | ORAL | Status: DC

## 2014-07-11 NOTE — Telephone Encounter (Signed)
rx refill - percocet 5-325mg   Last OV- 04/29/14  Last refilled- #90 / 0 rf  UDS- 04/29/14 contract signed..Marland Kitchen

## 2014-07-11 NOTE — Telephone Encounter (Signed)
Caller: Anthony SchanzJoseph Gill Rel to pt: self Ph #: [phone]  Reason for call: pt states last time he filled  pantoprazole (PROTONIX) 40 MG tablet [[id number]][[id number]]        He was told to have filled at [facility] next time. He states he needs med order sent in.  Pt also states he needs refill on pain meds.

## 2014-07-11 NOTE — Telephone Encounter (Signed)
[facility] and [facility] to [facility].  Percocet -- patient has canceled his gallbladder surgery and further appointments.  Needs to follow-up with surgery regarding this.  I will not be giving chronic pain medications for this issue without him following through with their recommendations. No refill will be granted.

## 2014-07-11 NOTE — Telephone Encounter (Signed)
Patient returned phone call. Best # [phone]

## 2014-07-11 NOTE — Telephone Encounter (Signed)
Left detailed message // pt to call back if any questions..Marland Kitchen

## 2014-07-12 NOTE — Telephone Encounter (Signed)
Pt notified again of message.

## 2014-08-12 ENCOUNTER — Ambulatory Visit: Payer: Self-pay | Admitting: General Surgery

## 2014-08-15 ENCOUNTER — Inpatient Hospital Stay (HOSPITAL_COMMUNITY)
Admission: RE | Admit: 2014-08-15 | Discharge: 2014-08-15 | Disposition: A | Discharge status: Home / Self Care | Payer: BLUE CROSS/BLUE SHIELD | Source: Ambulatory Visit

## 2014-08-15 NOTE — Pre-Procedure Instructions (Addendum)
    Anthony Gill  08/15/2014      [facility] [facility] - [place] - [address] [place] [address] Phone: [phone] Fax: [phone]  [facility] [address] - [address] [place]), [place] - [address] [place] ([place]) [place] [address] Phone: [phone] Fax: [phone]  [facility] [address] [place], [place] - [address] [place] [address] Phone: [phone] Fax: [phone]  [facility]) ELECTRONIC - [place], [place] - [address] [place] [address] Phone: [phone] Fax: [phone]    Your procedure is scheduled on Monday, June 20th, 2016 at 1:00 PM.  Report to [facility] Admitting at 11:00 A.M.  Call this number if you have problems the morning of surgery:  [phone]   Remember:  Do not eat food or drink liquids after midnight.   Take these medicines the morning of surgery with A SIP OF WATER Ondansetron (Zofran), OxyCodone-Acetaminophen (Percocet), Pantoprazole (Protonix).   Stop taking: Aspirin, Ibuprofen, Aleve, Naproxen, Goody's, BC's, fish oil, and herbal medications.     Do not wear jewelry.  Do not wear lotions, powders, or colognes.  You may wear deodorant.  Do not shave 48 hours prior to surgery.  Men may shave face and neck.  Do not bring valuables to the hospital.  [facility] is not responsible for any belongings or valuables.  Contacts, dentures or bridgework may not be worn into surgery.  Leave your suitcase in the car.  After surgery it may be brought to your room.  For patients admitted to the hospital, discharge time will be determined by your treatment team.  Patients discharged the day of surgery will not be allowed to drive home.    Special instructions:  See attached  Please read over the following fact sheets that you were given. Pain  Booklet, Coughing and Deep Breathing and Surgical Site Infection Prevention

## 2014-08-15 NOTE — Progress Notes (Signed)
Pt. Did not arrive for PAT appointment at 14:00.  Left message for patient.

## 2014-08-19 NOTE — Progress Notes (Signed)
Left message 08/18/14 at 1030 and 1325 requesting callback to schedule pre op appt. Called [phone] with Joe identifing himself on recoeding.  Attempted to call contact -wife- at home and number disconnected.  6/17 1020 left message on Joes cell phone to return my call.  Also notified Tresa Endo in triage at [facility] to notify Dr Andrey Campanile.

## 2014-08-22 ENCOUNTER — Encounter (HOSPITAL_COMMUNITY): Admission: RE | Payer: Self-pay | Source: Ambulatory Visit

## 2014-08-22 ENCOUNTER — Encounter (HOSPITAL_COMMUNITY): Payer: Self-pay | Admitting: Certified Registered Nurse Anesthetist

## 2014-08-22 ENCOUNTER — Ambulatory Visit (HOSPITAL_COMMUNITY)
Admission: RE | Admit: 2014-08-22 | Payer: BLUE CROSS/BLUE SHIELD | Source: Ambulatory Visit | Admitting: General Surgery

## 2014-08-22 SURGERY — LAPAROSCOPIC CHOLECYSTECTOMY WITH INTRAOPERATIVE CHOLANGIOGRAM
Anesthesia: General

## 2015-06-12 ENCOUNTER — Encounter: Payer: Self-pay | Admitting: Physician Assistant

## 2015-06-12 ENCOUNTER — Ambulatory Visit (INDEPENDENT_AMBULATORY_CARE_PROVIDER_SITE_OTHER): Payer: Self-pay | Admitting: Physician Assistant

## 2015-06-12 VITALS — BP 120/70 | HR 79 | Temp 98.6°F | Ht 67.0 in | Wt 197.2 lb

## 2015-06-12 DIAGNOSIS — R519 Headache, unspecified: Secondary | ICD-10-CM | POA: Insufficient documentation

## 2015-06-12 DIAGNOSIS — R51 Headache: Secondary | ICD-10-CM

## 2015-06-12 DIAGNOSIS — M545 Low back pain, unspecified: Secondary | ICD-10-CM | POA: Insufficient documentation

## 2015-06-12 MED ORDER — SUMATRIPTAN SUCCINATE 25 MG PO TABS: 25.0000 mg | ORAL_TABLET | Freq: Once | ORAL | Status: DC

## 2015-06-12 MED ORDER — MELOXICAM 15 MG PO TABS: 15.0000 mg | ORAL_TABLET | Freq: Every day | ORAL | Status: DC

## 2015-06-12 MED FILL — SUMATRIPTAN SUCC 25 MG TAB: 25 | 30 days supply | Qty: 9 | Fill #0

## 2015-06-12 MED FILL — MELOXICAM 15 MG TABLET: 15 | 30 days supply | Qty: 30 | Fill #0

## 2015-06-12 NOTE — Progress Notes (Signed)
Patient presents to clinic today c/o flare up of migraine headaches over the past couple of weeks. Denies change in diet, stressors, etc. Endorses headache with photophobia. Denies aura, nausea or vomiting. Endorses headaches worse than prior episodes. Has not taken anything for symptoms. Was previously referred to Neurology for migraines but did not follow-up with their recommendations.  Patient also endorses Right sided low back pain without radiation to extremities. Endorses is mainly aching but has felt burning when milder. Denies rash. Endorses symptoms have been present for > 1.5 weeks.   Past Medical History  Diagnosis Date  . Gout   . 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 Everlena Cooper).  MRI brain ordered.  . Migraines   . Chicken pox   . Epigastric pain 04/05/2014    Current Outpatient Prescriptions on File Prior to Visit  Medication Sig Dispense Refill  . pantoprazole (PROTONIX) 40 MG tablet Take 1 tablet (40 mg total) by mouth 2 (two) times daily. 180 tablet 1   No current facility-administered medications on file prior to visit.    Allergies  Allergen Reactions  . Ciprofloxacin Itching    Family History  Problem Relation Age of Onset  . Diabetes Mother     Living  . Hypertension      family history  . Prostate cancer Father     family history  . Heart disease Paternal Uncle   . Heart disease Father   . Heart attack Father 61    Deceased  . Diabetes Sister     x3  . Cancer Other     Aunts & Uncles  . Cancer Other     Grandparents  . Heart attack Paternal Uncle   . Hyperlipidemia Mother   . Hyperlipidemia Father   . Hyperlipidemia Sister   . Diabetes Brother     x1  . Hyperlipidemia Brother     x1    Social History   Social History  . Marital Status: Married    Spouse Name: N/A  . Number of Children: N/A  . Years of Education: N/A   Social History Main Topics  . Smoking status: Former Smoker  -- 3 years    Quit date: 03/04/2014  . Smokeless tobacco: Never Used  . Alcohol Use: Yes     Comment: social  . Drug Use: Yes     Comment: marijuana rare  . Sexual Activity: Yes    Birth Control/ Protection: None     Comment: married   Other Topics Concern  . 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   Grew up in [place]   Married   1 son   Review of Systems - See HPI.  All other ROS are negative.  BP 120/70 mmHg  Pulse 79  Temp(Src) 98.6 F (37 C) (Oral)  Ht  (1.702 m)  Wt 197 lb 3.2 oz (89.449 kg)  BMI 30.88 kg/m2  SpO2 97%  Physical Exam  Constitutional: He is oriented to person, place, and time and well-developed, well-nourished, and in no distress.  HENT:  Head: Normocephalic and atraumatic.  Eyes: Conjunctivae and EOM are normal. Pupils are equal, round, and reactive to light.  Neck: Neck supple.  Cardiovascular: Normal rate, regular rhythm, normal heart sounds and intact distal pulses.   Pulmonary/Chest: Effort normal and breath sounds normal. No respiratory distress. He has no wheezes. He has no rales. He  exhibits no tenderness.  Musculoskeletal:       Lumbar back: He exhibits tenderness and pain. He exhibits normal range of motion, no bony tenderness and no spasm.  No rash noted.  Neurological: He is alert and oriented to person, place, and time. No cranial nerve deficit.  Skin: Skin is warm and dry. No rash noted.  Psychiatric: Affect normal.  Vitals reviewed.    Assessment/Plan: Worsening headaches Referral placed back to Neurology for further assessment. Supportive and dietary measures reviewed. Rx Imitrex. Alarm signs/symptoms reviewed that would prompt ER assessment.   Right-sided low back pain without sciatica Rx Mobic. Heat and Topical Icy Hot to area. Supportive measures reviewed. Follow-up if not resolving.

## 2015-06-12 NOTE — Patient Instructions (Signed)
For back pain -- take Mobic once daily as directed. Avoid heavy lifting or overexertion. Apply heating pad to the area for 10 minutes 2-3 times per day.  For headaches -- increase fluids. Caffeine will be beneficial. Try to take time in the day to de-stress. Use Imitrex as directed for acute headache. Do not take more than directed.  You will be contacted for reassessment by Neurology.  It is important you go to the appointment.

## 2015-06-12 NOTE — Assessment & Plan Note (Signed)
Referral placed back to Neurology for further assessment. Supportive and dietary measures reviewed. Rx Imitrex. Alarm signs/symptoms reviewed that would prompt ER assessment.

## 2015-06-12 NOTE — Assessment & Plan Note (Signed)
Rx Mobic. Heat and Topical Icy Hot to area. Supportive measures reviewed. Follow-up if not resolving.

## 2015-06-12 NOTE — Progress Notes (Signed)
Pre visit review using our clinic review tool, if applicable. No additional management support is needed unless otherwise documented below in the visit note. 

## 2015-06-28 ENCOUNTER — Ambulatory Visit: Payer: Self-pay | Admitting: Physician Assistant

## 2015-06-28 ENCOUNTER — Telehealth: Payer: Self-pay | Admitting: Physician Assistant

## 2015-06-28 NOTE — Telephone Encounter (Signed)
No charge for 1st no-show 

## 2015-06-28 NOTE — Telephone Encounter (Signed)
Patient lvm 7:29am cancelling appointment contacted [facility] to [facility] left message awaiting call back. Charge or no charge

## 2015-07-05 ENCOUNTER — Telehealth: Payer: Self-pay | Admitting: Physician Assistant

## 2015-07-05 ENCOUNTER — Ambulatory Visit: Payer: Self-pay | Admitting: Physician Assistant

## 2015-07-06 NOTE — Telephone Encounter (Signed)
No charge. 

## 2015-07-06 NOTE — Telephone Encounter (Signed)
Pt was no show 07/05/15 11:00am for acute appt, pt has not rescheduled, 1st no show w/in 12 months, charge or no charge?

## 2015-07-07 ENCOUNTER — Encounter: Payer: Self-pay | Admitting: Physician Assistant

## 2015-07-07 NOTE — Telephone Encounter (Signed)
Waiving fee and mailing reminder letter °

## 2015-07-26 ENCOUNTER — Telehealth: Payer: Self-pay | Admitting: Physician Assistant

## 2015-07-26 NOTE — Telephone Encounter (Signed)
Called and spoke with the pt regarding the note below.   Pt stated that a letter was to be  faxed to his work regarding work restrictions, but his job has not received it.    Informed the pt that I did not see a note stating that a letter needing to be faxed.  Informed the pt that [facility].  Pt state that he does need to follow-up because of his back.  Pt stated that he will call back to schedule an appt.  Informed the pt that it would be best to wait and get a letter for work when he comes back for the follow-up.  Pt verbalized understanding and agreed.  Pt stated that he will call back to get an appt.//AB/CMA

## 2015-07-26 NOTE — Telephone Encounter (Signed)
Pt says that he was put on restrictions for work by PCP. He says that PCP was going to fax over something to his work. He says that he was informed by his job that they still haven't received anything. DOS: 06/12/15    Please assist further.   Informed pt that PCP is out of the office today.

## 2015-08-23 ENCOUNTER — Ambulatory Visit: Payer: Self-pay | Admitting: Physician Assistant

## 2015-08-28 ENCOUNTER — Telehealth: Payer: Self-pay | Admitting: Physician Assistant

## 2015-08-28 NOTE — Telephone Encounter (Signed)
No Show June 21. Previous No Shows 07/05/2015 and 04/05/2014. Charge or No Charge?

## 2015-08-28 NOTE — Telephone Encounter (Signed)
That would be the provider's decision, who is out of the office this week/SLS 06/26

## 2015-09-04 ENCOUNTER — Encounter: Payer: Self-pay | Admitting: Physician Assistant

## 2016-04-15 ENCOUNTER — Encounter: Payer: Self-pay | Admitting: Emergency Medicine

## 2016-04-16 IMAGING — US US ABDOMEN COMPLETE
1 series · 14 of 25 positions shown · non-contrast
Comparison: CT same date

CLINICAL DATA: Gallstones, colitis, abdominal pain

EXAM:
ULTRASOUND ABDOMEN COMPLETE

[Series 1: us abdomen complete · 0.22mm/px · 14 of 82 slices shown]
[im 1/82]
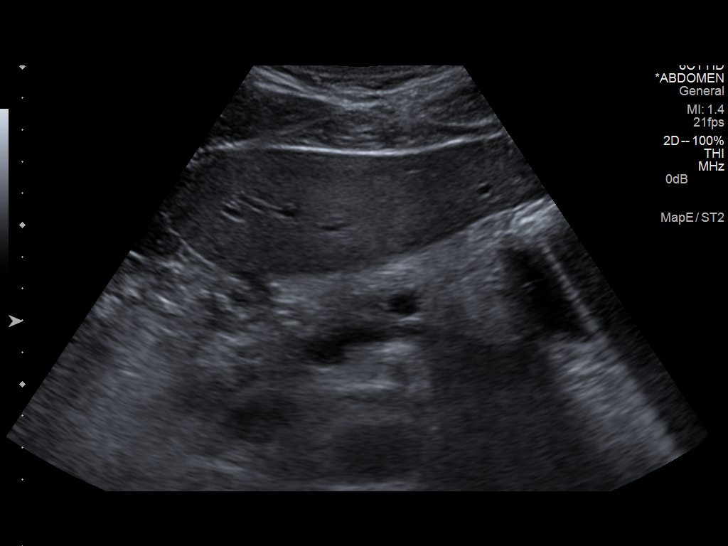
[im 7/82]
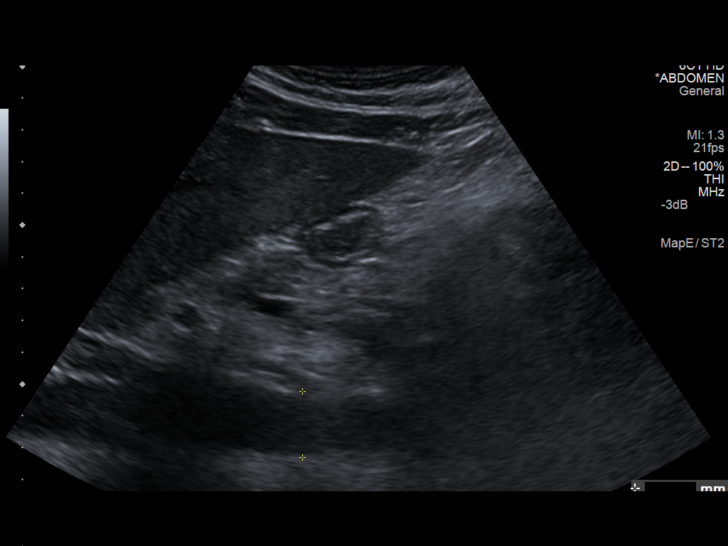
[im 14/82]
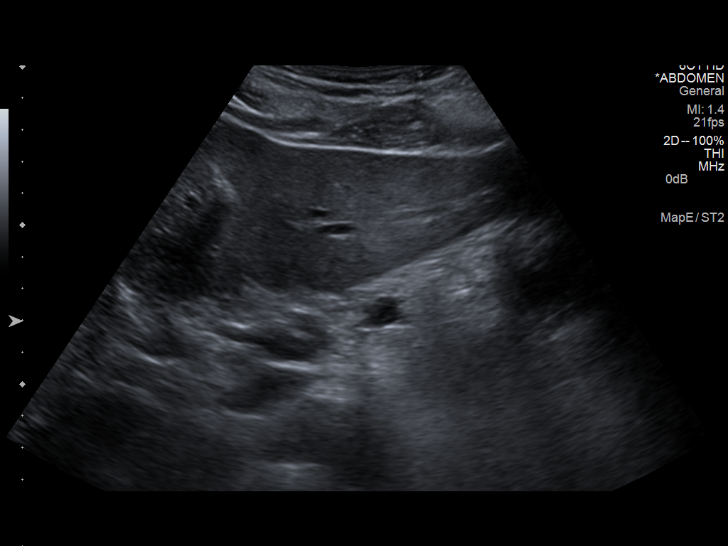
[im 21/82]
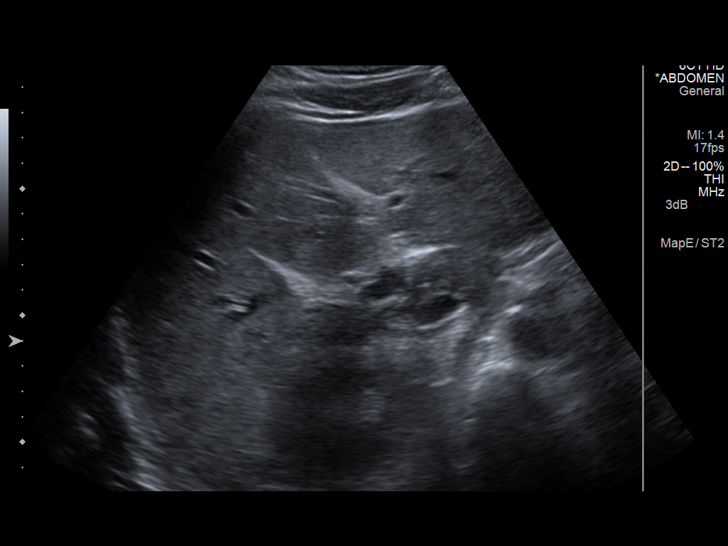
[im 28/82]
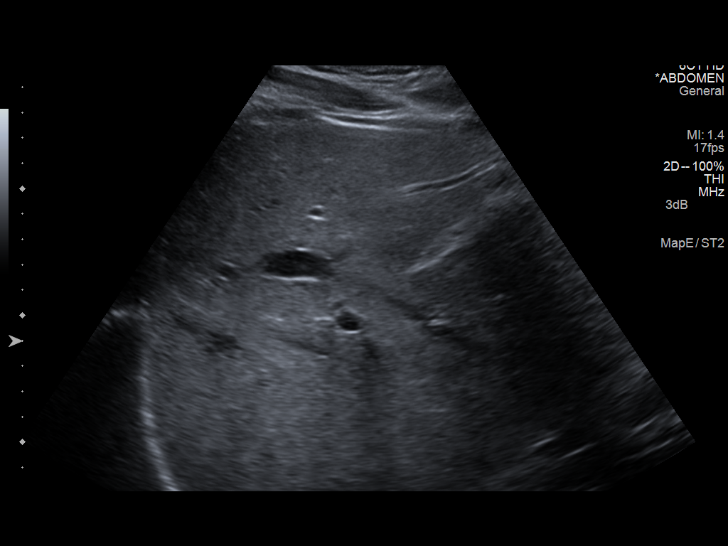
[im 31/82]
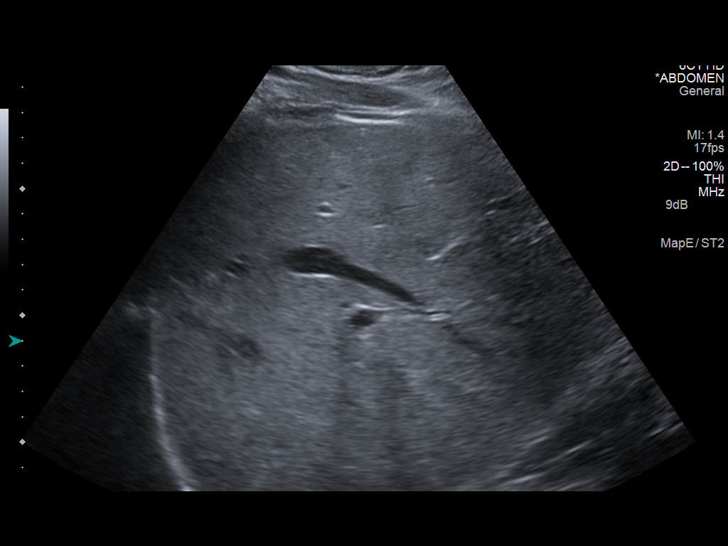
[im 38/82]
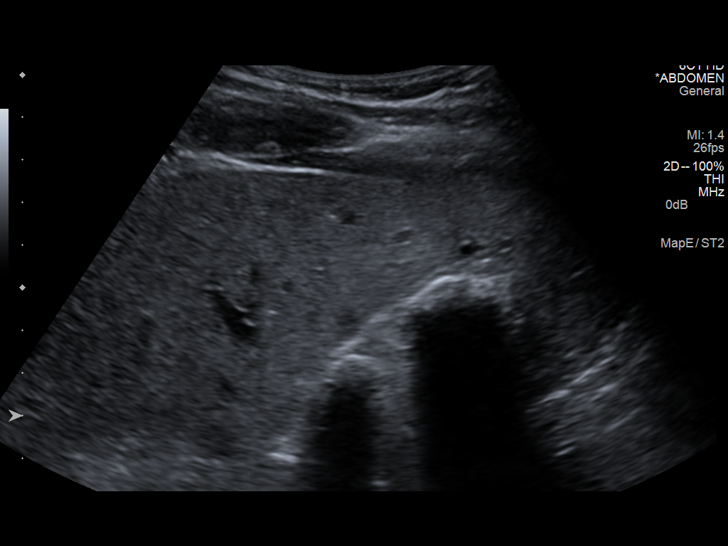
[im 44/82]
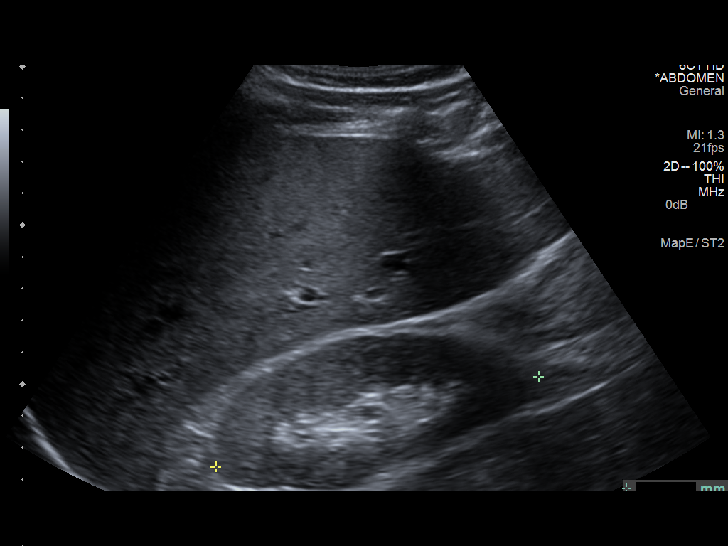
[im 51/82]
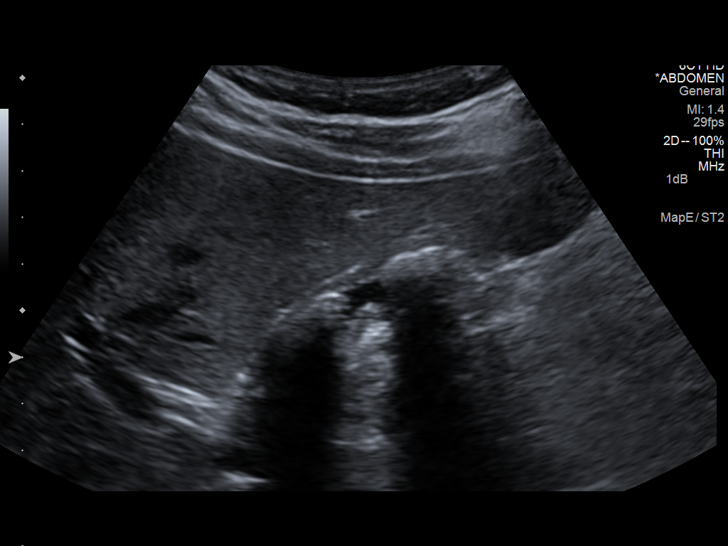
[im 55/82]
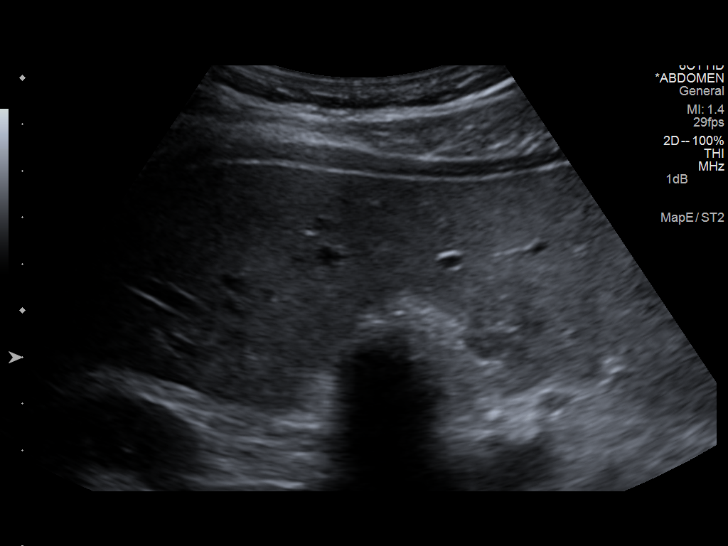
[im 61/82]
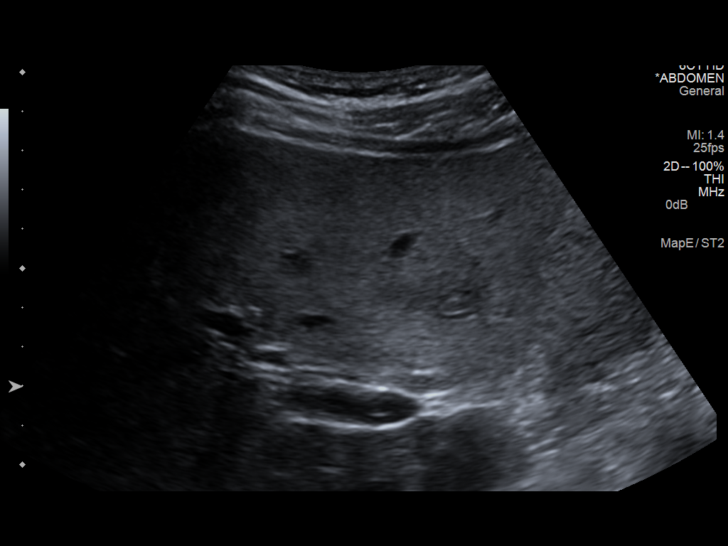
[im 68/82]
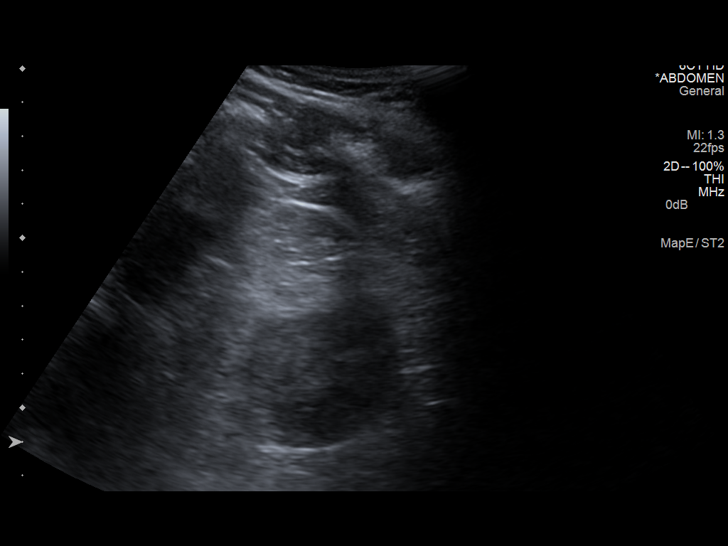
[im 75/82]
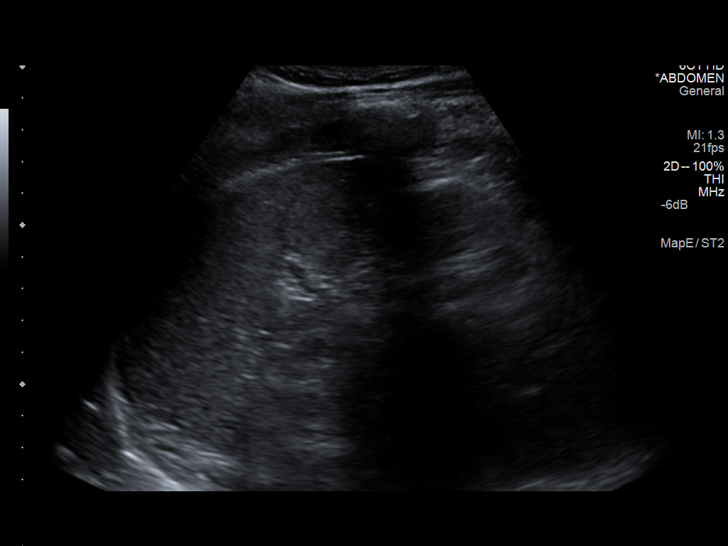
[im 82/82]
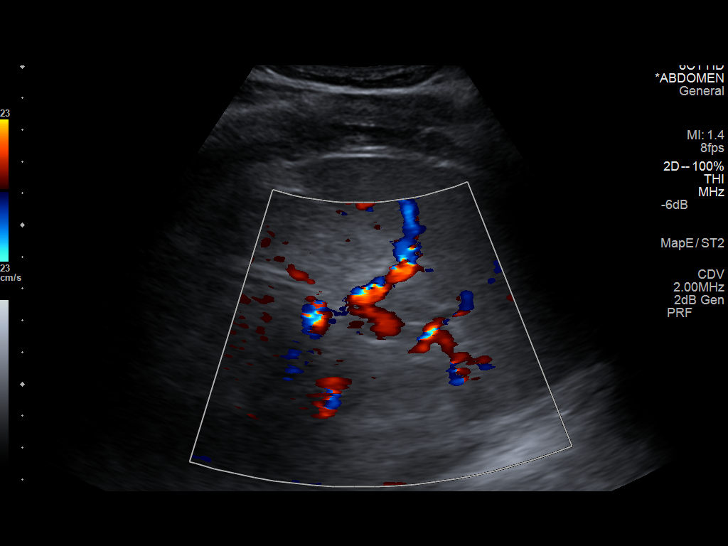

[14 of 25 positions shown; findings below may reference images not displayed]

FINDINGS: Gallbladder: Multiple gallstones are identified nearly filling the
gallbladder, largest 2.3 cm. Gallbladder wall thickness measures 3
mm maximally. No pericholecystic fluid or sonographic Murphy sign is
identified.

Common bile duct: Diameter: 3 mm

Liver: Inhomogeneous, no focal abnormality or intrahepatic ductal
dilatation

IVC: No abnormality visualized.

Pancreas: Visualized portion unremarkable.

Spleen: Size and appearance within normal limits.

Right Kidney: Length: 10.5 cm. Echogenicity within normal limits. No
mass or hydronephrosis visualized.

Left Kidney: Length: 10.6 cm. Echogenicity within normal limits. No
mass or hydronephrosis visualized.

Abdominal aorta: No aneurysm visualized.

Other findings: None.
IMPRESSION: Nearly stone filled gallbladder with wall thickness at upper limits
of normal which could indicate an element of chronic cholecystitis
but no pericholecystic fluid or sonographic Murphy sign is
identified.

## 2016-05-02 ENCOUNTER — Encounter: Payer: Self-pay | Admitting: Physician Assistant

## 2016-05-02 ENCOUNTER — Ambulatory Visit (INDEPENDENT_AMBULATORY_CARE_PROVIDER_SITE_OTHER): Payer: Self-pay | Admitting: Physician Assistant

## 2016-05-02 VITALS — BP 142/100 | HR 88 | Temp 97.9°F | Resp 14 | Ht 67.0 in | Wt 211.0 lb

## 2016-05-02 DIAGNOSIS — I10 Essential (primary) hypertension: Secondary | ICD-10-CM

## 2016-05-02 DIAGNOSIS — K219 Gastro-esophageal reflux disease without esophagitis: Secondary | ICD-10-CM

## 2016-05-02 DIAGNOSIS — R0602 Shortness of breath: Secondary | ICD-10-CM

## 2016-05-02 DIAGNOSIS — Z8249 Family history of ischemic heart disease and other diseases of the circulatory system: Secondary | ICD-10-CM

## 2016-05-02 HISTORY — DX: Essential (primary) hypertension: I10

## 2016-05-02 LAB — URINALYSIS, ROUTINE W REFLEX MICROSCOPIC
BILIRUBIN URINE: NEGATIVE
Hgb urine dipstick: NEGATIVE
KETONES UR: NEGATIVE
Nitrite: NEGATIVE
PH: 6 (ref 5.0–8.0)
TOTAL PROTEIN, URINE-UPE24: NEGATIVE
URINE GLUCOSE: NEGATIVE
UROBILINOGEN UA: 0.2 (ref 0.0–1.0)

## 2016-05-02 LAB — COMPREHENSIVE METABOLIC PANEL
ALT: 28 U/L (ref 0–53)
AST: 27 U/L (ref 0–37)
Albumin: 4.6 g/dL (ref 3.5–5.2)
Alkaline Phosphatase: 66 U/L (ref 39–117)
BILIRUBIN TOTAL: 0.3 mg/dL (ref 0.2–1.2)
BUN: 9 mg/dL (ref 6–23)
CO2: 29 mEq/L (ref 19–32)
Calcium: 9.7 mg/dL (ref 8.4–10.5)
Chloride: 103 mEq/L (ref 96–112)
Creatinine, Ser: 1.14 mg/dL (ref 0.40–1.50)
GFR: 89.86 mL/min (ref >60.00)
Glucose, Bld: 144 mg/dL — ABNORMAL HIGH (ref 70–99)
Potassium: 4.6 mEq/L (ref 3.5–5.1)
SODIUM: 137 meq/L (ref 135–145)
Total Protein: 7.6 g/dL (ref 6.0–8.3)

## 2016-05-02 LAB — LDL CHOLESTEROL, DIRECT: Direct LDL: 44 mg/dL

## 2016-05-02 LAB — HEMOGLOBIN A1C: Hgb A1c MFr Bld: 6 % (ref 4.6–6.5)

## 2016-05-02 LAB — LIPID PANEL
Cholesterol: 155 mg/dL (ref 0–200)
HDL: 38 mg/dL — ABNORMAL LOW (ref >39.00)
Total CHOL/HDL Ratio: 4
Triglycerides: 487 mg/dL — ABNORMAL HIGH (ref 0.0–149.0)

## 2016-05-02 LAB — TSH: TSH: 0.5 u[IU]/mL (ref 0.35–4.50)

## 2016-05-02 MED ORDER — HYDROCHLOROTHIAZIDE 25 MG PO TABS: 25.0000 mg | ORAL_TABLET | Freq: Every day | ORAL | 1 refills | Status: DC

## 2016-05-02 MED ORDER — PANTOPRAZOLE SODIUM 40 MG PO TBEC: 40.0000 mg | DELAYED_RELEASE_TABLET | Freq: Every day | ORAL | 3 refills | Status: DC

## 2016-05-02 MED FILL — PANTOPRAZOLE SOD DR 40 MG T: 40 | 30 days supply | Qty: 30 | Fill #0 | Status: TO

## 2016-05-02 MED FILL — HYDROCHLOROTHIAZIDE 25 MG T: 25 | 30 days supply | Qty: 30 | Fill #0

## 2016-05-02 NOTE — Patient Instructions (Signed)
Please go to the lab for blood work.  I will call you with your results.  Please start the Hydrochlorothiazide as directed. Start the Protonix daily as directed. No more Goody or BC powders.  Tylenol if needed for headache.  Cut back caffeine intake in half. Increase water intake.  Follow the diet below. You will be contacted by Cardiology for an appointment. Would consider stress testing giving your intermittent symptoms and family history.   Follow-up with me in 2 weeks.  If you note any chest pain or shortness of breath, please call 911 or go to the ER.   Food Choices for Gastroesophageal Reflux Disease, Adult When you have gastroesophageal reflux disease (GERD), the foods you eat and your eating habits are very important. Choosing the right foods can help ease your discomfort. What guidelines do I need to follow?  Choose fruits, vegetables, whole grains, and low-fat dairy products.  Choose low-fat meat, fish, and poultry.  Limit fats such as oils, salad dressings, butter, nuts, and avocado.  Keep a food diary. This helps you identify foods that cause symptoms.  Avoid foods that cause symptoms. These may be different for everyone.  Eat small meals often instead of 3 large meals a day.  Eat your meals slowly, in a place where you are relaxed.  Limit fried foods.  Cook foods using methods other than frying.  Avoid drinking alcohol.  Avoid drinking large amounts of liquids with your meals.  Avoid bending over or lying down until 2-3 hours after eating. What foods are not recommended? These are some foods and drinks that may make your symptoms worse: Vegetables  Tomatoes. Tomato juice. Tomato and spaghetti sauce. Chili peppers. Onion and garlic. Horseradish. Fruits  Oranges, grapefruit, and lemon (fruit and juice). Meats  High-fat meats, fish, and poultry. This includes hot dogs, ribs, ham, sausage, salami, and bacon. Dairy  Whole milk and chocolate milk. Sour  cream. Cream. Butter. Ice cream. Cream cheese. Drinks  Coffee and tea. Bubbly (carbonated) drinks or energy drinks. Condiments  Hot sauce. Barbecue sauce. Sweets/Desserts  Chocolate and cocoa. Donuts. Peppermint and spearmint. Fats and Oils  High-fat foods. This includes Jamaica[place] fries and potato chips. Other  Vinegar. Strong spices. This includes black pepper, white pepper, red pepper, cayenne, curry powder, cloves, ginger, and chili powder. The items listed above may not be a complete list of foods and drinks to avoid. Contact your dietitian for more information.  This information is not intended to replace advice given to you by your health care provider. Make sure you discuss any questions you have with your health care provider. Document Released: 08/20/2011 Document Revised: 07/27/2015 Document Reviewed: 12/23/2012 Elsevier Interactive Patient Education  2017 [employer]

## 2016-05-02 NOTE — Assessment & Plan Note (Signed)
With HTN. Referral to cardiology placed for stress testing. EKG today with NSR. Labs today.

## 2016-05-02 NOTE — Assessment & Plan Note (Signed)
Stop BC powders. Start Probiotic and Protonix. GERD diet to be started. Discussed exercise to promote weight loss. FU scheduled.

## 2016-05-02 NOTE — Progress Notes (Signed)
Patient presents to clinic today c/o frequent headaches over the past two months associated with elevated BP. Notes checking BP here and there. Has been ranging 140-160/100. Denies chest pain, palpitations. Lightheadedness or dizziness. Sometimes notes mild windedness with climbing stairs but notes has been eating very poorly and has gained weight. No regular exercise.   Patient with significant history of GERD, previously on PPI.  Has not been taking any medication. Notes daily acid reflux with intermittent globus. Denies abdominal pain, nausea/vomiting, dysphagia. Denies cough.   Past Medical History:  Diagnosis Date  . Chicken pox   . Epigastric pain 04/05/2014  . Essential hypertension 05/02/2016  . Gout   . Headache disorder 2015   Tension w/medication overuse component suspected by neurologist (Dr. Everlena CooperJaffe).  MRI brain ordered.  . Migraines     No current outpatient prescriptions on file prior to visit.   No current facility-administered medications on file prior to visit.     Allergies  Allergen Reactions  . Ciprofloxacin Itching    Family History  Problem Relation Age of Onset  . Diabetes Mother     Living  . Hyperlipidemia Mother   . Hypertension      family history  . Prostate cancer Father     family history  . Heart disease Father   . Heart attack Father 3080    Deceased  . Hyperlipidemia Father   . Heart disease Paternal Uncle   . Diabetes Sister     x3  . Cancer Other     Aunts & Uncles  . Cancer Other     Grandparents  . Heart attack Paternal Uncle   . Hyperlipidemia Sister   . Diabetes Brother     x1  . Hyperlipidemia Brother     x1    Social History   Social History  . Marital status: Married    Spouse name: N/A  . Number of children: N/A  . Years of education: N/A   Social History Main Topics  . Smoking status: Former Smoker    Years: 3.00    Quit date: 03/04/2014  . Smokeless tobacco: Never Used  . Alcohol use Yes     Comment: social    . Drug use: Yes     Comment: marijuana rare  . Sexual activity: Yes    Birth control/ protection: None     Comment: married   Other Topics Concern  . 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   [place]   Married   1 son   Review of Systems - See HPI.  All other ROS are negative.  BP (!) 142/100   Pulse 88   Temp 97.9 F (36.6 C) (Oral)   Resp 14   Ht 5\' 7"  (1.702 m)   Wt 211 lb (95.7 kg)   SpO2 95%   BMI 33.05 kg/m   Physical Exam  Constitutional: He is oriented to person, place, and time and well-developed, well-nourished, and in no distress.  HENT:  Head: Normocephalic and atraumatic.  Eyes: Conjunctivae are normal. Pupils are equal, round, and reactive to light.  Neck: Neck supple.  Cardiovascular: Normal rate, regular rhythm, normal heart sounds and intact distal pulses.   Pulmonary/Chest: Effort normal and breath sounds normal. No respiratory distress. He has no wheezes. He has no rales. He exhibits no tenderness.  Abdominal: Soft. Bowel sounds are normal. He exhibits no distension and  no mass. There is no tenderness. There is no rebound and no guarding.  Neurological: He is alert and oriented to person, place, and time.  Skin: Skin is warm and dry. No rash noted.  Psychiatric: Affect normal.  Vitals reviewed.  Assessment/Plan: Family history of early CAD With HTN. Referral to cardiology placed for stress testing. EKG today with NSR. Labs today.  GERD (gastroesophageal reflux disease) Stop BC powders. Start Probiotic and Protonix. GERD diet to be started. Discussed exercise to promote weight loss. FU scheduled.   Essential hypertension EKG with NSR. Headaches. Home BP elevated as well to almost stage II levels. Will start HCTZ 25 mg daily. Labs today. FU 2 weeks. Will repeat BMP at that time and determine need for K+ supplementation. DASH diet reviewed.    Piedad Climes, PA-C

## 2016-05-02 NOTE — Assessment & Plan Note (Signed)
EKG with NSR. Headaches. Home BP elevated as well to almost stage II levels. Will start HCTZ 25 mg daily. Labs today. FU 2 weeks. Will repeat BMP at that time and determine need for K+ supplementation. DASH diet reviewed.

## 2016-05-02 NOTE — Progress Notes (Signed)
Pre visit review using our clinic review tool, if applicable. No additional management support is needed unless otherwise documented below in the visit note. 

## 2016-05-10 ENCOUNTER — Telehealth: Payer: Self-pay | Admitting: Physician Assistant

## 2016-05-10 NOTE — Telephone Encounter (Signed)
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 Pt was driving in truck while talking to me on phone. Pt states that he will call back with fax number for work.

## 2016-05-10 NOTE — Telephone Encounter (Signed)
Pt called back to give fax number [phone] Attn Lanney GinsSharrel Nygaard, pt asked that he be wrote out of work starting Monday 3/12 until f/u appt on Thurs.

## 2016-05-10 NOTE — Telephone Encounter (Signed)
Note faxed in to the provided number

## 2016-05-10 NOTE — Telephone Encounter (Signed)
Note written and faxed in.

## 2016-05-16 ENCOUNTER — Encounter: Payer: Self-pay | Admitting: Physician Assistant

## 2016-05-16 ENCOUNTER — Ambulatory Visit (INDEPENDENT_AMBULATORY_CARE_PROVIDER_SITE_OTHER): Payer: BLUE CROSS/BLUE SHIELD | Admitting: Physician Assistant

## 2016-05-16 VITALS — BP 140/96 | HR 81 | Temp 98.0°F | Resp 14 | Ht 67.0 in | Wt 206.0 lb

## 2016-05-16 DIAGNOSIS — K219 Gastro-esophageal reflux disease without esophagitis: Secondary | ICD-10-CM | POA: Diagnosis not present

## 2016-05-16 DIAGNOSIS — R8290 Unspecified abnormal findings in urine: Secondary | ICD-10-CM | POA: Insufficient documentation

## 2016-05-16 DIAGNOSIS — I10 Essential (primary) hypertension: Secondary | ICD-10-CM | POA: Diagnosis not present

## 2016-05-16 LAB — POCT URINALYSIS DIPSTICK
BILIRUBIN UA: NEGATIVE
Blood, UA: NEGATIVE
Glucose, UA: NEGATIVE
Ketones, UA: NEGATIVE
NITRITE UA: NEGATIVE
PH UA: 6
PROTEIN UA: NEGATIVE
Spec Grav, UA: 1.015
Urobilinogen, UA: 0.2

## 2016-05-16 MED ORDER — LOSARTAN POTASSIUM 25 MG PO TABS: 25.0000 mg | ORAL_TABLET | Freq: Every day | ORAL | 0 refills | Status: DC

## 2016-05-16 MED FILL — LOSARTAN POTASSIUM 25 MG TA: 25 | 30 days supply | Qty: 30 | Fill #0

## 2016-05-16 NOTE — Progress Notes (Signed)
Patient presents to clinic today for follow-up of hypertension and GERD.   Patient is currently on regimen of HCTZ 25 mg daily. Is taking as directed without side effect. Notes checking BP at home which has improved. Varies from 120s/80s to 140s/90s. Patient denies chest pain, palpitations, lightheadedness, dizziness, vision changes or frequent headaches. Labs at last visit revealed A1C at 6 and Lipid panel with LDL at 44.  Patient also has taken Protonix as directed and stopped use of NSAID powders. Endorses resolution of epigastric discomfort, heart burn and nausea. Is making better dietary choices.   Patient awaiting appointment with Cardiology for stress testing giving family history.   Past Medical History:  Diagnosis Date  . Chicken pox   . Epigastric pain 04/05/2014  . Essential hypertension 05/02/2016  . Gout   . Headache disorder 2015   Tension w/medication overuse component suspected by neurologist (Dr. Everlena Cooper).  MRI brain ordered.  . Migraines     Current Outpatient Prescriptions on File Prior to Visit  Medication Sig Dispense Refill  . hydrochlorothiazide (HYDRODIURIL) 25 MG tablet Take 1 tablet (25 mg total) by mouth daily. 30 tablet 1  . pantoprazole (PROTONIX) 40 MG tablet Take 1 tablet (40 mg total) by mouth daily. 30 tablet 3   No current facility-administered medications on file prior to visit.     Allergies  Allergen Reactions  . Ciprofloxacin Itching    Family History  Problem Relation Age of Onset  . Diabetes Mother     Living  . Hyperlipidemia Mother   . Hypertension      family history  . Prostate cancer Father     family history  . Heart disease Father   . Heart attack Father 9    Deceased  . Hyperlipidemia Father   . Heart disease Paternal Uncle   . Diabetes Sister     x3  . Cancer Other     Aunts & Uncles  . Cancer Other     Grandparents  . Heart attack Paternal Uncle   . Hyperlipidemia Sister   . Diabetes Brother     x1  .  Hyperlipidemia Brother     x1    Social History   Social History  . Marital status: Married    Spouse name: N/A  . Number of children: N/A  . Years of education: N/A   Social History Main Topics  . Smoking status: Former Smoker    Years: 3.00    Quit date: 03/04/2014  . Smokeless tobacco: Never Used  . Alcohol use Yes     Comment: social  . Drug use: Yes     Comment: marijuana rare  . Sexual activity: Yes    Birth control/ protection: None     Comment: married   Other Topics Concern  . 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   Grew up in [place]   Married   1 son    Review of Systems - See HPI.  All other ROS are negative.  BP (!) 140/96   Pulse 81   Temp 98 F (36.7 C) (Oral)   Resp 14   Ht 5\' 7"  (1.702 m)   Wt 206 lb (93.4 kg)   SpO2 97%   BMI 32.26 kg/m   Physical Exam  Constitutional: He is oriented to person, place, and time and well-developed, well-nourished, and in no distress.  HENT:  Head: Normocephalic and atraumatic.  Eyes: Pupils are equal, round, and reactive to light.  Neck: Neck supple.  Cardiovascular: Normal rate, regular rhythm, normal heart sounds and intact distal pulses.   Pulmonary/Chest: Effort normal and breath sounds normal. No respiratory distress. He has no wheezes. He has no rales. He exhibits no tenderness.  Lymphadenopathy:    He has no cervical adenopathy.  Neurological: He is alert and oriented to person, place, and time.  Skin: Skin is warm and dry. No rash noted.  Psychiatric: Affect normal.  Vitals reviewed.  Recent Results (from the past 2160 hour(s))  Comprehensive metabolic panel     Status: Abnormal   Collection Time: 05/02/16  8:53 AM  Result Value Ref Range   Sodium 137 135 - 145 mEq/L   Potassium 4.6 3.5 - 5.1 mEq/L   Chloride 103 96 - 112 mEq/L   CO2 29 19 - 32 mEq/L   Glucose, Bld 144 (H) 70 - 99 mg/dL   BUN 9 6 - 23 mg/dL   Creatinine, Ser 1.61 0.40 - 1.50  mg/dL   Total Bilirubin 0.3 0.2 - 1.2 mg/dL   Alkaline Phosphatase 66 39 - 117 U/L   AST 27 0 - 37 U/L   ALT 28 0 - 53 U/L   Total Protein 7.6 6.0 - 8.3 g/dL   Albumin 4.6 3.5 - 5.2 g/dL   Calcium 9.7 8.4 - 09.6 mg/dL   GFR 04.54 >09.81 mL/min  Lipid panel     Status: Abnormal   Collection Time: 05/02/16  8:53 AM  Result Value Ref Range   Cholesterol 155 0 - 200 mg/dL    Comment: ATP III Classification       Desirable:  < 200 mg/dL               Borderline High:  200 - 239 mg/dL          High:  > = 191 mg/dL   Triglycerides (H) 0.0 - 149.0 mg/dL    478.2 Triglyceride is over 400; calculations on Lipids are invalid.    Comment: Normal:  <150 mg/dLBorderline High:  150 - 199 mg/dL   HDL 95.62 (L) >13.08 mg/dL   Total CHOL/HDL Ratio 4     Comment:                Men          Women1/2 Average Risk     3.4          3.3Average Risk          5.0          4.42X Average Risk          9.6          7.13X Average Risk          15.0          11.0                      TSH     Status: None   Collection Time: 05/02/16  8:53 AM  Result Value Ref Range   TSH 0.50 0.35 - 4.50 uIU/mL  Urinalysis, Routine w reflex microscopic     Status: Abnormal   Collection Time: 05/02/16  8:53 AM  Result Value Ref Range   Color, Urine YELLOW Yellow;Lt. Yellow   APPearance CLEAR Clear   Specific Gravity, Urine <=1.005 (A) 1.000 - 1.030   pH 6.0 5.0 - 8.0   Total Protein, Urine NEGATIVE Negative  Urine Glucose NEGATIVE Negative   Ketones, ur NEGATIVE Negative   Bilirubin Urine NEGATIVE Negative   Hgb urine dipstick NEGATIVE Negative   Urobilinogen, UA 0.2 0.0 - 1.0   Leukocytes, UA Small (A) Negative   Nitrite NEGATIVE Negative   WBC, UA 3-6/hpf (A) 0-2/hpf   RBC / HPF 0-2/hpf 0-2/hpf  Hemoglobin A1c     Status: None   Collection Time: 05/02/16  8:53 AM  Result Value Ref Range   Hgb A1c MFr Bld 6.0 4.6 - 6.5 %    Comment: Glycemic Control Guidelines for People with Diabetes:Non Diabetic:  <6%Goal of  Therapy: <7%Additional Action Suggested:  >8%   LDL cholesterol, direct     Status: None   Collection Time: 05/02/16  8:53 AM  Result Value Ref Range   Direct LDL 44.0 mg/dL    Comment: Optimal:  <161<100 mg/dLNear or Above Optimal:  100-129 mg/dLBorderline High:  130-159 mg/dLHigh:  160-189 mg/dLVery High:  >190 mg/dL  POCT Urinalysis Dipstick     Status: Abnormal   Collection Time: 05/16/16 12:48 PM  Result Value Ref Range   Color, UA yellow    Clarity, UA clear    Glucose, UA negative    Bilirubin, UA negative    Ketones, UA negative    Spec Grav, UA 1.015 1.003, 1.005, 1.010, 1.015, 1.020, 1.025, 1.030, 1.035   Blood, UA negative    pH, UA 6.0 5.0, 5.5, 6.0, 6.5, 7.0, 7.5, 8.0   Protein, UA negative    Urobilinogen, UA 0.2 0.2, 1.0, negative   Nitrite, UA negative    Leukocytes, UA Trace (A) Negative    Assessment/Plan: Essential hypertension Will continue HCTZ 25 mg daily. Start losartan 25 mg daily. Continue DASH diet. [facility] for patient to call and schedule his appointment. FU 2 weeks.  GERD (gastroesophageal reflux disease) Symptoms resolved. Continue TLC. Will attempt wean of Protonix to QOD dosing. FU 2 weeks.   Abnormal urinalysis LE noted on last UA. Repeat today also reveals LE. Will send for culture. Asymptomatic at present.     Piedad ClimesMartin,  [place], PA-C

## 2016-05-16 NOTE — Progress Notes (Signed)
Pre visit review using our clinic review tool, if applicable. No additional management support is needed unless otherwise documented below in the visit note. 

## 2016-05-16 NOTE — Assessment & Plan Note (Signed)
Symptoms resolved. Continue TLC. Will attempt wean of Protonix to QOD dosing. FU 2 weeks.

## 2016-05-16 NOTE — Patient Instructions (Addendum)
Please stop by the lab for a urine sample.   Please continue the hydrochlorothiazide as directed.  Start the losartan as directed. Please continue a diet low in salt.   Use the dietary handouts to help with meal planning.   Follow-up with me in 2 weeks. Please attempt to reach Cardiology at [phone] to schedule your appointment.

## 2016-05-16 NOTE — Assessment & Plan Note (Signed)
LE noted on last UA. Repeat today also reveals LE. Will send for culture. Asymptomatic at present.

## 2016-05-16 NOTE — Assessment & Plan Note (Signed)
Will continue HCTZ 25 mg daily. Start losartan 25 mg daily. Continue DASH diet. [facility] given for patient to call and schedule his appointment. FU 2 weeks.

## 2016-05-18 LAB — URINE CULTURE: ORGANISM ID, BACTERIA: NO GROWTH

## 2016-05-30 ENCOUNTER — Ambulatory Visit (INDEPENDENT_AMBULATORY_CARE_PROVIDER_SITE_OTHER): Payer: BLUE CROSS/BLUE SHIELD | Admitting: Physician Assistant

## 2016-05-30 ENCOUNTER — Encounter: Payer: Self-pay | Admitting: Physician Assistant

## 2016-05-30 VITALS — BP 128/84 | HR 89 | Temp 97.8°F | Resp 14 | Ht 67.0 in | Wt 203.0 lb

## 2016-05-30 DIAGNOSIS — I10 Essential (primary) hypertension: Secondary | ICD-10-CM

## 2016-05-30 LAB — BASIC METABOLIC PANEL
BUN: 15 mg/dL (ref 6–23)
CALCIUM: 9.6 mg/dL (ref 8.4–10.5)
CO2: 27 meq/L (ref 19–32)
CREATININE: 1.24 mg/dL (ref 0.40–1.50)
Chloride: 101 mEq/L (ref 96–112)
GFR: 81.52 mL/min (ref >60.00)
GLUCOSE: 129 mg/dL — AB (ref 70–99)
Potassium: 3.9 mEq/L (ref 3.5–5.1)
SODIUM: 137 meq/L (ref 135–145)

## 2016-05-30 NOTE — Patient Instructions (Signed)
Please continue current medication regimen. Keep working on diet and exercise. Follow-up with Cardiology as scheduled on Monday.  Follow-up with me in 3 months. I will call you with your lab results.

## 2016-05-30 NOTE — Progress Notes (Signed)
Pre visit review using our clinic review tool, if applicable. No additional management support is needed unless otherwise documented below in the visit note. 

## 2016-05-30 NOTE — Progress Notes (Signed)
Patient presents to clinic today for follow-up of hypertension. At last visit, losartan was added to regimen. Is taking both this and hydrochlorothiazide as directed. Patient denies chest pain, palpitations, lightheadedness, dizziness, vision changes or frequent headaches.  BP Readings from Last 3 Encounters:  05/30/16 128/84  05/16/16 (!) 140/96  05/02/16 (!) 142/100   Patient has appointment next Monday with Cardiology for stress testing.  Past Medical History:  Diagnosis Date  . Chicken pox   . Epigastric pain 04/05/2014  . Essential hypertension 05/02/2016  . Gout   . Headache disorder 2015   Tension w/medication overuse component suspected by neurologist (Dr. Everlena Cooper).  MRI brain ordered.  . Migraines     Current Outpatient Prescriptions on File Prior to Visit  Medication Sig Dispense Refill  . hydrochlorothiazide (HYDRODIURIL) 25 MG tablet Take 1 tablet (25 mg total) by mouth daily. 30 tablet 1  . losartan (COZAAR) 25 MG tablet Take 1 tablet (25 mg total) by mouth daily. 30 tablet 0  . pantoprazole (PROTONIX) 40 MG tablet Take 1 tablet (40 mg total) by mouth daily. 30 tablet 3   No current facility-administered medications on file prior to visit.     Allergies  Allergen Reactions  . Ciprofloxacin Itching    Family History  Problem Relation Age of Onset  . Diabetes Mother     Living  . Hyperlipidemia Mother   . Hypertension      family history  . Prostate cancer Father     family history  . Heart disease Father   . Heart attack Father 70    Deceased  . Hyperlipidemia Father   . Heart disease Paternal Uncle   . Diabetes Sister     x3  . Cancer Other     Aunts & Uncles  . Cancer Other     Grandparents  . Heart attack Paternal Uncle   . Hyperlipidemia Sister   . Diabetes Brother     x1  . Hyperlipidemia Brother     x1    Social History   Social History  . Marital status: Married    Spouse name: N/A  . Number of children: N/A  . Years of education:  N/A   Social History Main Topics  . Smoking status: Former Smoker    Years: 3.00    Quit date: 03/04/2014  . Smokeless tobacco: Never Used  . Alcohol use Yes     Comment: social  . Drug use: Yes     Comment: marijuana rare  . Sexual activity: Yes    Birth control/ protection: None     Comment: married   Other Topics Concern  . 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   Grew up in [place]   Married   1 son   Review of Systems - See HPI.  All other ROS are negative.  BP 128/84   Pulse 89   Temp 97.8 F (36.6 C) (Oral)   Resp 14   Ht 5\' 7"  (1.702 m)   Wt 203 lb (92.1 kg)   SpO2 94%   BMI 31.79 kg/m   Physical Exam  Constitutional: He is oriented to person, place, and time and well-developed, well-nourished, and in no distress.  HENT:  Head: Normocephalic and atraumatic.  Cardiovascular: Normal rate, regular rhythm, normal heart sounds and intact distal pulses.   Pulmonary/Chest: Effort normal and breath sounds normal. No respiratory distress. He has no wheezes.  He has no rales. He exhibits no tenderness.  Neurological: He is alert and oriented to person, place, and time.  Skin: Skin is warm and dry. No rash noted.  Psychiatric: Affect normal.  Vitals reviewed.   Recent Results (from the past 2160 hour(s))  Comprehensive metabolic panel     Status: Abnormal   Collection Time: 05/02/16  8:53 AM  Result Value Ref Range   Sodium 137 135 - 145 mEq/L   Potassium 4.6 3.5 - 5.1 mEq/L   Chloride 103 96 - 112 mEq/L   CO2 29 19 - 32 mEq/L   Glucose, Bld 144 (H) 70 - 99 mg/dL   BUN 9 6 - 23 mg/dL   Creatinine, Ser 6.041.14 0.40 - 1.50 mg/dL   Total Bilirubin 0.3 0.2 - 1.2 mg/dL   Alkaline Phosphatase 66 39 - 117 U/L   AST 27 0 - 37 U/L   ALT 28 0 - 53 U/L   Total Protein 7.6 6.0 - 8.3 g/dL   Albumin 4.6 3.5 - 5.2 g/dL   Calcium 9.7 8.4 - 54.010.5 mg/dL   GFR 98.1189.86 >91.47>60.00 mL/min  Lipid panel     Status: Abnormal   Collection Time:  05/02/16  8:53 AM  Result Value Ref Range   Cholesterol 155 0 - 200 mg/dL    Comment: ATP III Classification       Desirable:  < 200 mg/dL               Borderline High:  200 - 239 mg/dL          High:  > = 829240 mg/dL   Triglycerides (H) 0.0 - 149.0 mg/dL    562.1487.0 Triglyceride is over 400; calculations on Lipids are invalid.    Comment: Normal:  <150 mg/dLBorderline High:  150 - 199 mg/dL   HDL 30.8638.00 (L) >57.84>39.00 mg/dL   Total CHOL/HDL Ratio 4     Comment:                Men          Women1/2 Average Risk     3.4          3.3Average Risk          5.0          4.42X Average Risk          9.6          7.13X Average Risk          15.0          11.0                      TSH     Status: None   Collection Time: 05/02/16  8:53 AM  Result Value Ref Range   TSH 0.50 0.35 - 4.50 uIU/mL  Urinalysis, Routine w reflex microscopic     Status: Abnormal   Collection Time: 05/02/16  8:53 AM  Result Value Ref Range   Color, Urine YELLOW Yellow;Lt. Yellow   APPearance CLEAR Clear   Specific Gravity, Urine <=1.005 (A) 1.000 - 1.030   pH 6.0 5.0 - 8.0   Total Protein, Urine NEGATIVE Negative   Urine Glucose NEGATIVE Negative   Ketones, ur NEGATIVE Negative   Bilirubin Urine NEGATIVE Negative   Hgb urine dipstick NEGATIVE Negative   Urobilinogen, UA 0.2 0.0 - 1.0   Leukocytes, UA Small (A) Negative   Nitrite NEGATIVE Negative   WBC, UA 3-6/hpf (A)  0-2/hpf   RBC / HPF 0-2/hpf 0-2/hpf  Hemoglobin A1c     Status: None   Collection Time: 05/02/16  8:53 AM  Result Value Ref Range   Hgb A1c MFr Bld 6.0 4.6 - 6.5 %    Comment: Glycemic Control Guidelines for People with Diabetes:Non Diabetic:  <6%Goal of Therapy: <7%Additional Action Suggested:  >8%   LDL cholesterol, direct     Status: None   Collection Time: 05/02/16  8:53 AM  Result Value Ref Range   Direct LDL 44.0 mg/dL    Comment: Optimal:  <409 mg/dLNear or Above Optimal:  100-129 mg/dLBorderline High:  130-159 mg/dLHigh:  160-189 mg/dLVery High:   >190 mg/dL  POCT Urinalysis Dipstick     Status: Abnormal   Collection Time: 05/16/16 12:48 PM  Result Value Ref Range   Color, UA yellow    Clarity, UA clear    Glucose, UA negative    Bilirubin, UA negative    Ketones, UA negative    Spec Grav, UA 1.015 1.003, 1.005, 1.010, 1.015, 1.020, 1.025, 1.030, 1.035   Blood, UA negative    pH, UA 6.0 5.0, 5.5, 6.0, 6.5, 7.0, 7.5, 8.0   Protein, UA negative    Urobilinogen, UA 0.2 0.2, 1.0, negative   Nitrite, UA negative    Leukocytes, UA Trace (A) Negative  Urine Culture     Status: None   Collection Time: 05/16/16 12:49 PM  Result Value Ref Range   Organism ID, Bacteria NO GROWTH   Basic metabolic panel     Status: Abnormal   Collection Time: 05/30/16 12:09 PM  Result Value Ref Range   Sodium 137 135 - 145 mEq/L   Potassium 3.9 3.5 - 5.1 mEq/L   Chloride 101 96 - 112 mEq/L   CO2 27 19 - 32 mEq/L   Glucose, Bld 129 (H) 70 - 99 mg/dL   BUN 15 6 - 23 mg/dL   Creatinine, Ser 8.11 0.40 - 1.50 mg/dL   Calcium 9.6 8.4 - 91.4 mg/dL   GFR 78.29 >56.21 mL/min   Assessment/Plan: Essential hypertension BP normotensive. Asymptomatic. Continue current regimen. Follow-up in 3 months. Will repeat labs at that time.    Piedad Climes, PA-C

## 2016-06-01 NOTE — Assessment & Plan Note (Signed)
BP normotensive. Asymptomatic. Continue current regimen. Follow-up in 3 months. Will repeat labs at that time.

## 2016-06-03 ENCOUNTER — Encounter: Payer: Self-pay | Admitting: Emergency Medicine

## 2016-06-03 MED FILL — HYDROCHLOROTHIAZIDE 25 MG T: 25 | 30 days supply | Qty: 30 | Fill #1

## 2016-06-18 MED FILL — PANTOPRAZOLE SOD DR 40 MG T: 40 | 30 days supply | Qty: 30 | Fill #0

## 2016-06-26 ENCOUNTER — Encounter: Payer: Self-pay | Admitting: *Deleted

## 2016-07-09 ENCOUNTER — Other Ambulatory Visit: Payer: Self-pay | Admitting: Emergency Medicine

## 2016-07-09 DIAGNOSIS — I10 Essential (primary) hypertension: Secondary | ICD-10-CM

## 2016-07-09 MED ORDER — LOSARTAN POTASSIUM 25 MG PO TABS: 25.0000 mg | ORAL_TABLET | Freq: Every day | ORAL | 3 refills | Status: DC

## 2016-07-09 MED ORDER — HYDROCHLOROTHIAZIDE 25 MG PO TABS: 25.0000 mg | ORAL_TABLET | Freq: Every day | ORAL | 3 refills | Status: DC

## 2016-07-09 MED FILL — LOSARTAN POTASSIUM 25 MG TA: 25 | 30 days supply | Qty: 30 | Fill #0

## 2016-07-09 MED FILL — HYDROCHLOROTHIAZIDE 25 MG T: 25 | 30 days supply | Qty: 30 | Fill #0

## 2016-07-16 ENCOUNTER — Ambulatory Visit: Payer: BLUE CROSS/BLUE SHIELD | Admitting: Cardiology

## 2016-07-17 ENCOUNTER — Encounter: Payer: Self-pay | Admitting: Cardiology

## 2016-08-16 MED FILL — HYDROCHLOROTHIAZIDE 25 MG T: 25 | 30 days supply | Qty: 30 | Fill #1

## 2016-08-16 MED FILL — PANTOPRAZOLE SOD DR 40 MG T: 40 | 30 days supply | Qty: 30 | Fill #1

## 2016-08-16 MED FILL — LOSARTAN POTASSIUM 25 MG TA: 25 | 30 days supply | Qty: 30 | Fill #1

## 2016-08-30 ENCOUNTER — Ambulatory Visit: Payer: BLUE CROSS/BLUE SHIELD | Admitting: Physician Assistant

## 2016-10-09 MED FILL — LOSARTAN POTASSIUM 25 MG TA: 25 | 30 days supply | Qty: 30 | Fill #2

## 2016-10-09 MED FILL — HYDROCHLOROTHIAZIDE 25 MG T: 25 | 30 days supply | Qty: 30 | Fill #2

## 2016-10-09 MED FILL — PANTOPRAZOLE SOD DR 40 MG T: 40 | 30 days supply | Qty: 30 | Fill #2

## 2016-11-18 ENCOUNTER — Emergency Department (HOSPITAL_COMMUNITY)
Admission: EM | Admit: 2016-11-18 | Discharge: 2016-11-18 | Disposition: A | Discharge status: Home / Self Care | Payer: Self-pay | Attending: Emergency Medicine | Admitting: Emergency Medicine

## 2016-11-18 ENCOUNTER — Ambulatory Visit (INDEPENDENT_AMBULATORY_CARE_PROVIDER_SITE_OTHER): Payer: BLUE CROSS/BLUE SHIELD | Admitting: Physician Assistant

## 2016-11-18 ENCOUNTER — Encounter (HOSPITAL_COMMUNITY): Payer: Self-pay

## 2016-11-18 DIAGNOSIS — I10 Essential (primary) hypertension: Secondary | ICD-10-CM | POA: Insufficient documentation

## 2016-11-18 DIAGNOSIS — M779 Enthesopathy, unspecified: Secondary | ICD-10-CM | POA: Insufficient documentation

## 2016-11-18 DIAGNOSIS — M25522 Pain in left elbow: Secondary | ICD-10-CM | POA: Insufficient documentation

## 2016-11-18 DIAGNOSIS — X500XXA Overexertion from strenuous movement or load, initial encounter: Secondary | ICD-10-CM | POA: Insufficient documentation

## 2016-11-18 DIAGNOSIS — L03114 Cellulitis of left upper limb: Secondary | ICD-10-CM | POA: Insufficient documentation

## 2016-11-18 DIAGNOSIS — Z87891 Personal history of nicotine dependence: Secondary | ICD-10-CM | POA: Insufficient documentation

## 2016-11-18 DIAGNOSIS — Z79899 Other long term (current) drug therapy: Secondary | ICD-10-CM | POA: Insufficient documentation

## 2016-11-18 MED ORDER — CEPHALEXIN 500 MG PO CAPS: 500.0000 mg | ORAL_CAPSULE | Freq: Four times a day (QID) | ORAL | 0 refills | Status: DC

## 2016-11-18 MED ORDER — NAPROXEN 500 MG PO TABS: 500.0000 mg | ORAL_TABLET | Freq: Two times a day (BID) | ORAL | 0 refills | Status: DC

## 2016-11-18 MED ORDER — KETOROLAC TROMETHAMINE 30 MG/ML IJ SOLN
15.0000 mg | Freq: Once | INTRAMUSCULAR | Status: AC
  Administered 2016-11-18: 15 mg via INTRAMUSCULAR
  Filled 2016-11-18: qty 1

## 2016-11-18 NOTE — Progress Notes (Signed)
Patient left after check in.   Erroneous encounter. Please disregard.

## 2016-11-18 NOTE — ED Provider Notes (Signed)
[facility]-EMERGENCY DEPT Provider Note   CSN: [id number] Arrival date & time: 11/18/16  1256     History   Chief Complaint Chief Complaint  Patient presents with  . Joint Swelling    HPI Anthony Gill is a 46 y.o. male with a history of Hypertension, and gout who presents to the emergency department today for joint pain. The patient reports that he has had 4 days of joint pain. He reports 4 days ago when sitting down his arm on a coffee table the right elbow struck the coffee table which caused him to have intermittent pins and needle feeling that traveled down into the patient's third and fourth digits of his hand. This has not reoccurred. The next day the patient noted that he started having "actual pain" of his left elbow. He states this was a dull, achy pain that he mainly felt over the posterior aspect of his elbow/tricep. Pain was not worse with movement of the arm but only with palpation. Last night the patient noted that he had redness of the arm but states she had not been previously pain attention if there is redness prior to this. He has tried tramadol and Tylenol for this without any relief. He denies any decreased range of motion, fever, chills, trauma, weakness or current numbness/tingling of the extremity.  HPI  Past Medical History:  Diagnosis Date  . Chicken pox   . Epigastric pain 04/05/2014  . Essential hypertension 05/02/2016  . Gout   . Headache disorder 2015   Tension w/medication overuse component suspected by neurologist (Dr. Everlena Cooper).  MRI brain ordered.  . Migraines     Patient Active Problem List   Diagnosis Date Noted  . Essential hypertension 05/02/2016  . Family history of early CAD 05/02/2016  . PUD (peptic ulcer disease) 03/14/2014  . GERD (gastroesophageal reflux disease) 06/09/2013  . Gout 09/15/2011    Past Surgical History:  Procedure Laterality Date  . COLONOSCOPY N/A 04/10/2014   Procedure: COLONOSCOPY;  Surgeon: Barrie Folk, MD;  Location: [facility]  ENDOSCOPY;  Service: Endoscopy;  Laterality: N/A;  . ESOPHAGOGASTRODUODENOSCOPY N/A 04/10/2014   Procedure: ESOPHAGOGASTRODUODENOSCOPY (EGD);  Surgeon: Barrie Folk, MD;  Location: [facility] ENDOSCOPY;  Service: Endoscopy;  Laterality: N/A;  . HERNIA REPAIR  2006  . WISDOM TOOTH EXTRACTION         Home Medications    Prior to Admission medications   Medication Sig Start Date End Date Taking? Authorizing Provider  cephALEXin (KEFLEX) 500 MG capsule Take 1 capsule (500 mg total) by mouth 4 (four) times daily. 11/18/16   Maczis, Elmer Sow, PA-C  hydrochlorothiazide (HYDRODIURIL) 25 MG tablet Take 1 tablet (25 mg total) by mouth daily. 07/09/16   Waldon Merl, PA-C  losartan (COZAAR) 25 MG tablet Take 1 tablet (25 mg total) by mouth daily. 07/09/16   Waldon Merl, PA-C  naproxen (NAPROSYN) 500 MG tablet Take 1 tablet (500 mg total) by mouth 2 (two) times daily. 11/18/16   Maczis, Elmer Sow, PA-C  pantoprazole (PROTONIX) 40 MG tablet Take 1 tablet (40 mg total) by mouth daily. 05/02/16   Waldon Merl, PA-C    Family History Family History  Problem Relation Age of Onset  . Diabetes Mother   . Hyperlipidemia Mother   . Hypertension Unknown        family history  . Prostate cancer Father        family history  . Heart disease Father   . Heart attack Father  80       Deceased  . Hyperlipidemia Father   . Heart disease Paternal Uncle   . Diabetes Sister        x3  . Cancer Other        Aunts & Uncles  . Cancer Other        Grandparents  . Heart attack Paternal Uncle   . Hyperlipidemia Sister   . Diabetes Brother        x1  . Hyperlipidemia Brother        x1    Social History Social History  Substance Use Topics  . Smoking status: Former Smoker    Years: 3.00    Quit date: 03/04/2014  . Smokeless tobacco: Never Used  . Alcohol use Yes     Comment: social     Allergies   Ciprofloxacin   Review of Systems Review of Systems  Constitutional: Negative for chills and  fever.  Musculoskeletal: Positive for arthralgias.  Skin: Positive for color change.  Neurological: Negative for weakness and numbness.     Physical Exam Updated Vital Signs BP (!) 169/101 (BP Location: Left Arm)   Pulse 99   Temp 98.4 F (36.9 C) (Oral)   Resp 16   Ht  (1.727 m)   Wt 95.7 kg (211 lb)   SpO2 92%   BMI 32.08 kg/m   Physical Exam  Constitutional: He appears well-developed and well-nourished.  HENT:  Head: Normocephalic and atraumatic.  Right Ear: External ear normal.  Left Ear: External ear normal.  Eyes: Conjunctivae are normal. Right eye exhibits no discharge. Left eye exhibits no discharge. No scleral icterus.  Cardiovascular:  Pulses:      Radial pulses are 2+ on the right side, and 2+ on the left side.  Pulmonary/Chest: Effort normal. No respiratory distress.  Musculoskeletal:  Left Shoulder: Appearance normal. No obvious bony deformity. No skin swelling, erythema, heat, fluctuance or break of the skin. No TTP. Active and passive flexion, extension, abduction, adduction, and internal/external rotation intact without pain or crepitus. Strength for flexion, extension, abduction, adduction, and internal/external rotation intact and appropriate for age. Negative Hawkin's test. Negative Neer's test.  Left Elbow: Redness as noted on skin exam. Appearance otherwise normal. No obvious bony deformity. [place] of the skin. No joint swelling. No bony TTP. Mild TTP over distal triceps. Active flexion, supination and pronation full and intact without pain. Pain noted with extension but full and intact. No bursa swelling. Strength able and appropriate for age for flexion and extension. Good grip strength b/l.  Radial Pulse 2+. Cap refill <2 seconds. SILT for M/U/R distributions. Compartments soft.   Neurological: He is alert. No sensory deficit.  Skin: Skin is warm, dry and intact. Capillary refill takes less than 2 seconds. There is erythema. No pallor.  Posterior  aspect of left arm with mild erythema and warmth. This is TTP. There is no induration, or fluctuance. No blisters, no pustules, no bullous impetigo, no vesicles, no desquamation, no target lesions with dusky purpura or a central bulla.  Psychiatric: He has a normal mood and affect.  Nursing note and vitals reviewed.    ED Treatments / Results  Labs (all labs ordered are listed, but only abnormal results are displayed) Labs Reviewed - No data to display  EKG  EKG Interpretation None       Radiology No results found.  Procedures Procedures (including critical care time)  Medications Ordered in ED Medications  ketorolac (TORADOL) 30 MG/ML  injection 15 mg (not administered)     Initial Impression / Assessment and Plan / ED Course  I have reviewed the triage vital signs and the nursing notes.  Pertinent labs & imaging results that were available during my care of the patient were reviewed by me and considered in my medical decision making (see chart for details).     Is a 46 year old male that presents today for bilateral elbow pain. Patient's vital signs are reassuring on presentation. He is without fever, tachycardia, tachypnea, hypoxia or hypotension. Patient notes right-sided elbow pain as pins and needles that occurred after he sat down his elbow on the coffee table 4 days ago. These pins and needles extended into the ulnar distribution of the arm. I was able to palpate in the same area and really elicit these symptoms for the patient on the right side. I do not suspect this is a issue that needs further workup. Patient's left elbow pain is with normal range of motion and without swelling of the joint. I do not suspect septic joint at this time. Patient does have tenderness at the distal aspect of the triceps and pain with maximal extension which may be a concerning factor of triceps tendinitis. The patient does do repetitive motions at work lifting and pushing heavy boxes and  notes that he has been sore in this area previously in similar fashions. I will give NSAIDs for this and advise RICE therapy. The patient also has an area of erythema and heat on the distal aspect of the left arm. This is not raised or with any gross abscess. It does not extend over the joint line. Patient is without history of diabetes or immunocompromise. I suspect this is uncomplicated cellulitis and can be treated on an outpatient basis. I will start the patient on Keflex and have him follow-up with his primary care doctor. I advised the patient to return for any spreading erythema, development of an abscess or systemic signs of illness such as fever, nausea, vomiting at home. I also advised the patient to return if he is developed limited and painful range of motion as well as swelling of the joint that would be more concerning for a septic joint. The patient is in agreement with plan and verbalizes understanding. He is safe for discharge.  Final Clinical Impressions(s) / ED Diagnoses   Final diagnoses:  Cellulitis of left upper extremity  Tendonitis    New Prescriptions New Prescriptions   CEPHALEXIN (KEFLEX) 500 MG CAPSULE    Take 1 capsule (500 mg total) by mouth 4 (four) times daily.   NAPROXEN (NAPROSYN) 500 MG TABLET    Take 1 tablet (500 mg total) by mouth 2 (two) times daily.     Jacinto Halim, PA-C 11/18/16 [address] 11/18/16 2009    Nira Conn, MD 11/19/16 [phone]

## 2016-11-18 NOTE — ED Triage Notes (Signed)
Patient c/o right elbow swelling and pain x 4 days. Swelling is better,but continues to have pins and needles feeling. Then 3 days ago, patient began having swelling to the left elbow without the pain.

## 2016-11-18 NOTE — Discharge Instructions (Signed)
Please take all of your antibiotics until finished!   You may develop abdominal discomfort or diarrhea from the antibiotic.  You may help offset this with probiotics which you can buy or get in yogurt. Do not eat  or take the probiotics until 2 hours after your antibiotic.   Follow up with your PCP this week for recheck. If you develop worsening or new concerning symptoms you can return to the emergency department for re-evaluation. See below for additional return precautions.   Cellulitis is a skin infection. The infected area is usually red and sore. This condition occurs most often in the arms and lower legs. It is very important to get treated for this condition. You also have some tendonitis that I would like you to take Naproxen for. Follow RICE therapy as attached.  Follow these instructions at home: Take over-the-counter and prescription medicines only as told by your doctor. If you were prescribed an antibiotic medicine, take it as told by your doctor. Do not stop taking the antibiotic even if you start to feel better. Drink enough fluid to keep your pee (urine) clear or pale yellow. Do not touch or rub the infected area. Raise (elevate) the infected area above the level of your heart while you are sitting or lying down. Place warm or cold wet cloths (warm or cold compresses) on the infected area. Do this as told by your doctor. Keep all follow-up visits as told by your doctor. This is important. These visits let your doctor make sure your infection is not getting worse. Contact a doctor if: You have a fever. Your symptoms do not get better after 1-2 days of treatment. Your bone or joint under the infected area starts to hurt after the skin has healed. Your infection comes back. This can happen in the same area or another area. You have a swollen bump in the infected area. You have new symptoms. You feel ill and also have muscle aches and pains. Get help right away if: Your symptoms  get worse. You feel very sleepy. You throw up (vomit) or have watery poop (diarrhea) for a long time. There are red streaks coming from the infected area. Your red area gets larger. Your red area turns darker.

## 2016-11-18 NOTE — Progress Notes (Signed)
Patient was not seen. Unsure of why patient left. Questions if  insurance was active or would be a self pay for visit.

## 2016-11-19 MED FILL — NAPROXEN 500 MG TABLET: 500 | 15 days supply | Qty: 30 | Fill #0

## 2016-11-19 MED FILL — CEPHALEXIN 500 MG CAPSULE: 500 | 5 days supply | Qty: 20 | Fill #0

## 2017-05-15 MED FILL — HYDROCHLOROTHIAZIDE 25 MG T: 25 | 30 days supply | Qty: 30 | Fill #3

## 2017-05-15 MED FILL — LOSARTAN POTASSIUM 25 MG TA: 25 | 30 days supply | Qty: 30 | Fill #3

## 2017-08-07 ENCOUNTER — Encounter: Payer: Self-pay | Admitting: Family Medicine

## 2017-08-07 ENCOUNTER — Other Ambulatory Visit (INDEPENDENT_AMBULATORY_CARE_PROVIDER_SITE_OTHER): Payer: BLUE CROSS/BLUE SHIELD

## 2017-08-07 ENCOUNTER — Other Ambulatory Visit: Payer: Self-pay

## 2017-08-07 ENCOUNTER — Ambulatory Visit: Payer: BLUE CROSS/BLUE SHIELD | Admitting: Family Medicine

## 2017-08-07 ENCOUNTER — Other Ambulatory Visit: Payer: Self-pay | Admitting: General Practice

## 2017-08-07 VITALS — BP 150/93 | HR 96 | Temp 98.4°F | Resp 16 | Ht 67.0 in | Wt 206.2 lb

## 2017-08-07 DIAGNOSIS — R7309 Other abnormal glucose: Secondary | ICD-10-CM

## 2017-08-07 DIAGNOSIS — I10 Essential (primary) hypertension: Secondary | ICD-10-CM | POA: Diagnosis not present

## 2017-08-07 DIAGNOSIS — M109 Gout, unspecified: Secondary | ICD-10-CM

## 2017-08-07 LAB — CBC WITH DIFFERENTIAL/PLATELET
BASOS PCT: 0.4 % (ref 0.0–3.0)
Basophils Absolute: 0 10*3/uL (ref 0.0–0.1)
EOS ABS: 0.1 10*3/uL (ref 0.0–0.7)
EOS PCT: 1.9 % (ref 0.0–5.0)
HCT: 39.2 % (ref 39.0–52.0)
Hemoglobin: 14 g/dL (ref 13.0–17.0)
Lymphocytes Relative: 35.2 % (ref 12.0–46.0)
Lymphs Abs: 2.2 10*3/uL (ref 0.7–4.0)
MCHC: 35.7 g/dL (ref 30.0–36.0)
MCV: 85.8 fl (ref 78.0–100.0)
MONO ABS: 0.4 10*3/uL (ref 0.1–1.0)
Monocytes Relative: 7.1 % (ref 3.0–12.0)
NEUTROS PCT: 55.4 % (ref 43.0–77.0)
Neutro Abs: 3.5 10*3/uL (ref 1.4–7.7)
Platelets: 221 10*3/uL (ref 150.0–400.0)
RBC: 4.57 Mil/uL (ref 4.22–5.81)
RDW: 13.6 % (ref 11.5–15.5)
WBC: 6.3 10*3/uL (ref 4.0–10.5)

## 2017-08-07 LAB — BASIC METABOLIC PANEL
BUN: 9 mg/dL (ref 6–23)
CHLORIDE: 99 meq/L (ref 96–112)
CO2: 30 mEq/L (ref 19–32)
CREATININE: 1.08 mg/dL (ref 0.40–1.50)
Calcium: 9.3 mg/dL (ref 8.4–10.5)
GFR: 95.09 mL/min (ref >60.00)
GLUCOSE: 170 mg/dL — AB (ref 70–99)
POTASSIUM: 3.8 meq/L (ref 3.5–5.1)
Sodium: 137 mEq/L (ref 135–145)

## 2017-08-07 LAB — URIC ACID: URIC ACID, SERUM: 10.1 mg/dL — AB (ref 4.0–7.8)

## 2017-08-07 LAB — HEMOGLOBIN A1C: HEMOGLOBIN A1C: 5.9 % (ref 4.6–6.5)

## 2017-08-07 MED ORDER — LOSARTAN POTASSIUM 50 MG PO TABS: 50.0000 mg | ORAL_TABLET | Freq: Every day | ORAL | 3 refills | Status: DC

## 2017-08-07 MED ORDER — ALLOPURINOL 100 MG PO TABS: 100.0000 mg | ORAL_TABLET | Freq: Every day | ORAL | 6 refills | Status: DC

## 2017-08-07 MED ORDER — PREDNISONE 10 MG PO TABS: ORAL_TABLET | ORAL | 0 refills | Status: DC

## 2017-08-07 MED FILL — predniSONE 10 MG TABS: 10 | 9 days supply | Qty: 18 | Fill #0

## 2017-08-07 MED FILL — LOSARTAN POTASSIUM 50 MG TA: 50 | 90 days supply | Qty: 90 | Fill #0

## 2017-08-07 MED FILL — ALLOPURINOL 100 MG TABLET: 100 | 30 days supply | Qty: 30 | Fill #0

## 2017-08-07 NOTE — Addendum Note (Signed)
Addended by: Geannie RisenBRODMERKEL, JESSICA L on: 08/07/2017 11:28 AM   Modules accepted: Orders

## 2017-08-07 NOTE — Assessment & Plan Note (Signed)
Deteriorated.  Pt has picture from 2 days ago that is very consistent w/ gout flare.  Less impressive redness and swelling today but still TTP.  Given hx of PUD, will start Prednisone taper.  Reviewed supportive care and red flags that should prompt return.  Pt expressed understanding and is in agreement w/ plan.

## 2017-08-07 NOTE — Patient Instructions (Signed)
Follow up in 1 month w/ [facility] to recheck BP (sooner if needed) We'll notify you of your lab results and make any changes if needed RESTART the Losartan once daily for blood pressure TAKE the Prednisone as directed for gout.  Take 3 pills at the same time w/ food x3 days, and then 2 pills at the same time x3 days, and then 1 pill daily LIMIT your salt/sodium intake to improve BP ICE your ankle Call with any questions or concerns Hang in there!

## 2017-08-07 NOTE — Assessment & Plan Note (Signed)
Deteriorated.  Pt has not followed up nor continued his medications.  + HAs, SOB, blurry vision since BP has been elevated.  Restart Losartan but increase to 50mg  daily as he will not take HCTZ in setting of gout.  Pt requires close f/u w/ PCP to monitor meds and adjust prn.  Pt expressed understanding and is in agreement w/ plan.

## 2017-08-07 NOTE — Progress Notes (Signed)
   Subjective:    Patient ID: Anthony Gill, male    DOB: 12-22-1970, 47 y.o.   MRN: [id number]  HPI HTN- chronic problem but pt has not been taking any of his meds recently.  'it's been out the roof lately'.  No CP.  + SOB- ongoing issue for pt.  + HAs.  + blurry vision.  Gout- chronic problem.  No recent flares.  R ankle has been swollen and painful since Sunday.  + joint redness.  No warmth.  Having a hard time walking.  Pain feels similar to previous gout flares.  No fevers.  Previously on allopurinol but not recently.   Review of Systems For ROS see HPI     Objective:   Physical Exam  Constitutional: He is oriented to person, place, and time. He appears well-developed and well-nourished. No distress.  HENT:  Head: Normocephalic and atraumatic.  Eyes: Pupils are equal, round, and reactive to light. Conjunctivae and EOM are normal.  Neck: Normal range of motion. Neck supple. No thyromegaly present.  Cardiovascular: Normal rate, regular rhythm, normal heart sounds and intact distal pulses.  No murmur heard. Pulmonary/Chest: Effort normal and breath sounds normal. No respiratory distress.  Abdominal: Soft. Bowel sounds are normal. He exhibits no distension.  Musculoskeletal: He exhibits edema (R ankle swelling over medial malleolus w/ erythema and TTP) and tenderness.  Lymphadenopathy:    He has no cervical adenopathy.  Neurological: He is alert and oriented to person, place, and time. No cranial nerve deficit.  Skin: Skin is warm and dry.  Psychiatric: He has a normal mood and affect. His behavior is normal.  Vitals reviewed.         Assessment & Plan:

## 2017-08-08 ENCOUNTER — Encounter: Payer: Self-pay | Admitting: General Practice

## 2017-09-08 ENCOUNTER — Ambulatory Visit: Payer: BLUE CROSS/BLUE SHIELD | Admitting: Physician Assistant

## 2017-09-08 DIAGNOSIS — Z0289 Encounter for other administrative examinations: Secondary | ICD-10-CM

## 2017-09-22 MED FILL — ALLOPURINOL 100 MG TABLET: 100 | 30 days supply | Qty: 30 | Fill #1

## 2017-10-17 ENCOUNTER — Ambulatory Visit: Payer: BLUE CROSS/BLUE SHIELD | Admitting: Family Medicine

## 2017-10-17 ENCOUNTER — Other Ambulatory Visit: Payer: Self-pay

## 2017-10-17 ENCOUNTER — Encounter: Payer: Self-pay | Admitting: Family Medicine

## 2017-10-17 VITALS — BP 138/88 | HR 92 | Temp 99.2°F | Resp 17 | Ht 67.0 in | Wt 201.4 lb

## 2017-10-17 DIAGNOSIS — M109 Gout, unspecified: Secondary | ICD-10-CM

## 2017-10-17 MED ORDER — PREDNISONE 10 MG PO TABS: ORAL_TABLET | ORAL | 0 refills | Status: DC

## 2017-10-17 NOTE — Progress Notes (Signed)
Subjective  CC:  Chief Complaint  Patient presents with  . Joint Swelling    Started a week ago, sharp pain when walking at times, pain radiates from back side up to back of knee, swelling in right ankle    HPI: Anthony Gill is a 47 y.o. male who presents to the office today to address the problems listed above in the chief complaint.  Reports pain in right ankle - started slowly and intermittently. Was treated for gout 2 months ago with pred - reports improved but now bad again. So painful couldn't sleep last night. Has used nsaids. Apparently was taken off allopurinol? Last uric acid was 10. No f/c/s, ankle injury, foot or toe pain, calf pain or redness.    Assessment  1. Acute gout of right ankle, unspecified cause      Plan   gout:  Education given. pred taper. Return in 2 weeks for recheck and discussion regarding allopurinol/uric acid recheck. If not improving, return sooner  Follow up: Return in about 2 weeks (around 10/31/2017) for recheck.   No orders of the defined types were placed in this encounter.  Meds ordered this encounter  Medications  . predniSONE (DELTASONE) 10 MG tablet    Sig: Take 4 tabs qd x 2 days, 3 qd x 2 days, 2 qd x 2d, 1qd x 3 days    Dispense:  21 tablet    Refill:  0      I reviewed the patients updated PMH, FH, and SocHx.    Patient Active Problem List   Diagnosis Date Noted  . Essential hypertension 05/02/2016  . Family history of early CAD 05/02/2016  . PUD (peptic ulcer disease) 03/14/2014  . GERD (gastroesophageal reflux disease) 06/09/2013  . Gout 09/15/2011   Current Meds  Medication Sig  . allopurinol (ZYLOPRIM) 100 MG tablet Take 1 tablet (100 mg total) by mouth daily.  Marland Kitchen. losartan (COZAAR) 50 MG tablet Take 1 tablet (50 mg total) by mouth daily.    Allergies: Patient is allergic to ciprofloxacin. Family History: Patient family history includes Cancer in his other and other; Diabetes in his brother, mother, and sister;  Heart attack in his paternal uncle; Heart attack (age of onset: 3480) in his father; Heart disease in his father and paternal uncle; Hyperlipidemia in his brother, father, mother, and sister; Hypertension in his unknown relative; Prostate cancer in his father. Social History:  Patient  reports that he quit smoking about 3 years ago. He quit after 3.00 years of use. He has never used smokeless tobacco. He reports that he drinks alcohol. He reports that he does not use drugs.  Review of Systems: Constitutional: Negative for fever malaise or anorexia Cardiovascular: negative for chest pain Respiratory: negative for SOB or persistent cough Gastrointestinal: negative for abdominal pain  Objective  Vitals: BP 138/88   Pulse 92   Temp 99.2 F (37.3 C) (Oral)   Resp 17   Ht 5\' 7"  (1.702 m)   Wt 201 lb 6.4 oz (91.4 kg)   SpO2 96%   BMI 31.54 kg/m  General: no acute distress , A&Ox3 Ext: feet: normal B, right ankle: no achilles ttp, mild joint swelling, pain with mvt of ankle joint w/o ttp, no lateral ligament ttp. No calf ttp.    Commons side effects, risks, benefits, and alternatives for medications and treatment plan prescribed today were discussed, and the patient expressed understanding of the given instructions. Patient is instructed to call or message via  MyChart if he/she has any questions or concerns regarding our treatment plan. No barriers to understanding were identified. We discussed Red Flag symptoms and signs in detail. Patient expressed understanding regarding what to do in case of urgent or emergency type symptoms.   Medication list was reconciled, printed and provided to the patient in AVS. Patient instructions and summary information was reviewed with the patient as documented in the AVS. This note was prepared with assistance of Dragon voice recognition software. Occasional wrong-word or sound-a-like substitutions may have occurred due to the inherent limitations of voice  recognition software

## 2017-10-17 NOTE — Patient Instructions (Addendum)
I believe this is related to your gout. Take the prednisone and recheck in 1-2 weeks.   We will need to reassess your gout levels, and try to get your back on your preventives.    Gout Gout is painful swelling that can occur in some of your joints. Gout is a type of arthritis. This condition is caused by having too much uric acid in your body. Uric acid is a chemical that forms when your body breaks down substances called purines. Purines are important for building body proteins. When your body has too much uric acid, sharp crystals can form and build up inside your joints. This causes pain and swelling. Gout attacks can happen quickly and be very painful (acute gout). Over time, the attacks can affect more joints and become more frequent (chronic gout). Gout can also cause uric acid to build up under your skin and inside your kidneys. What are the causes? This condition is caused by too much uric acid in your blood. This can occur because:  Your kidneys do not remove enough uric acid from your blood. This is the most common cause.  Your body makes too much uric acid. This can occur with some cancers and cancer treatments. It can also occur if your body is breaking down too many red blood cells (hemolytic anemia).  You eat too many foods that are high in purines. These foods include organ meats and some seafood. Alcohol, especially beer, is also high in purines.  A gout attack may be triggered by trauma or stress. What increases the risk? This condition is more likely to develop in people who:  Have a family history of gout.  Are male and middle-aged.  Are male and have gone through menopause.  Are obese.  Frequently drink alcohol, especially beer.  Are dehydrated.  Lose weight too quickly.  Have an organ transplant.  Have lead poisoning.  Take certain medicines, including aspirin, cyclosporine, diuretics, levodopa, and niacin.  Have kidney disease or psoriasis.  What  are the signs or symptoms? An attack of acute gout happens quickly. It usually occurs in just one joint. The most common place is the big toe. Attacks often start at night. Other joints that may be affected include joints of the feet, ankle, knee, fingers, wrist, or elbow. Symptoms may include:  Severe pain.  Warmth.  Swelling.  Stiffness.  Tenderness. The affected joint may be very painful to touch.  Shiny, red, or purple skin.  Chills and fever.  Chronic gout may cause symptoms more frequently. More joints may be involved. You may also have white or yellow lumps (tophi) on your hands or feet or in other areas near your joints. How is this diagnosed? This condition is diagnosed based on your symptoms, medical history, and physical exam. You may have tests, such as:  Blood tests to measure uric acid levels.  Removal of joint fluid with a needle (aspiration) to look for uric acid crystals.  X-rays to look for joint damage.  How is this treated? Treatment for this condition has two phases: treating an acute attack and preventing future attacks. Acute gout treatment may include medicines to reduce pain and swelling, including:  NSAIDs.  Steroids. These are strong anti-inflammatory medicines that can be taken by mouth (orally) or injected into a joint.  Colchicine. This medicine relieves pain and swelling when it is taken soon after an attack. It can be given orally or through an IV tube.  Preventive treatment may include:  Daily use of smaller doses of NSAIDs or colchicine.  Use of a medicine that reduces uric acid levels in your blood.  Changes to your diet. You may need to see a specialist about healthy eating (dietitian).  Follow these instructions at home: During a Gout Attack  If directed, apply ice to the affected area: ? Put ice in a plastic bag. ? Place a towel between your skin and the bag. ? Leave the ice on for 20 minutes, 2-3 times a day.  Rest the joint  as much as possible. If the affected joint is in your leg, you may be given crutches to use.  Raise (elevate) the affected joint above the level of your heart as often as possible.  Drink enough fluids to keep your urine clear or pale yellow.  Take over-the-counter and prescription medicines only as told by your health care provider.  Do not drive or operate heavy machinery while taking prescription pain medicine.  Follow instructions from your health care provider about eating or drinking restrictions.  Return to your normal activities as told by your health care provider. Ask your health care provider what activities are safe for you. Avoiding Future Gout Attacks  Follow a low-purine diet as told by your dietitian or health care provider. Avoid foods and drinks that are high in purines, including liver, kidney, anchovies, asparagus, herring, mushrooms, mussels, and beer.  Limit alcohol intake to no more than 1 drink a day for nonpregnant women and 2 drinks a day for men. One drink equals 12 oz of beer, 5 oz of wine, or 1 oz of hard liquor.  Maintain a healthy weight or lose weight if you are overweight. If you want to lose weight, talk with your health care provider. It is important that you do not lose weight too quickly.  Start or maintain an exercise program as told by your health care provider.  Drink enough fluids to keep your urine clear or pale yellow.  Take over-the-counter and prescription medicines only as told by your health care provider.  Keep all follow-up visits as told by your health care provider. This is important. Contact a health care provider if:  You have another gout attack.  You continue to have symptoms of a gout attack after10 days of treatment.  You have side effects from your medicines.  You have chills or a fever.  You have burning pain when you urinate.  You have pain in your lower back or belly. Get help right away if:  You have severe or  uncontrolled pain.  You cannot urinate. This information is not intended to replace advice given to you by your health care provider. Make sure you discuss any questions you have with your health care provider. Document Released: 02/16/2000 Document Revised: 07/27/2015 Document Reviewed: 12/01/2014 Elsevier Interactive Patient Education  [employer]2018 [employer]

## 2017-10-19 ENCOUNTER — Encounter: Payer: Self-pay | Admitting: Family Medicine

## 2017-10-29 ENCOUNTER — Other Ambulatory Visit: Payer: Self-pay

## 2017-10-29 ENCOUNTER — Ambulatory Visit: Payer: BLUE CROSS/BLUE SHIELD | Admitting: Physician Assistant

## 2017-10-29 ENCOUNTER — Encounter: Payer: Self-pay | Admitting: Physician Assistant

## 2017-10-29 ENCOUNTER — Other Ambulatory Visit: Payer: Self-pay | Admitting: Emergency Medicine

## 2017-10-29 VITALS — BP 136/84 | HR 85 | Temp 98.5°F | Resp 16 | Ht 67.0 in | Wt 199.4 lb

## 2017-10-29 DIAGNOSIS — M109 Gout, unspecified: Secondary | ICD-10-CM | POA: Diagnosis not present

## 2017-10-29 DIAGNOSIS — K219 Gastro-esophageal reflux disease without esophagitis: Secondary | ICD-10-CM

## 2017-10-29 LAB — BASIC METABOLIC PANEL
BUN: 9 mg/dL (ref 6–23)
CALCIUM: 9.3 mg/dL (ref 8.4–10.5)
CO2: 28 meq/L (ref 19–32)
CREATININE: 1.11 mg/dL (ref 0.40–1.50)
Chloride: 101 mEq/L (ref 96–112)
GFR: 92.04 mL/min (ref >60.00)
Glucose, Bld: 103 mg/dL — ABNORMAL HIGH (ref 70–99)
Potassium: 3.8 mEq/L (ref 3.5–5.1)
Sodium: 138 mEq/L (ref 135–145)

## 2017-10-29 LAB — URIC ACID: Uric Acid, Serum: 7.4 mg/dL (ref 4.0–7.8)

## 2017-10-29 MED ORDER — PANTOPRAZOLE SODIUM 40 MG PO TBEC: 40.0000 mg | DELAYED_RELEASE_TABLET | Freq: Every day | ORAL | 1 refills | Status: DC

## 2017-10-29 NOTE — Progress Notes (Signed)
Patient presents to clinic today for follow-up on gout. Patient with history of gout, previously on allopurinol 100 mg daily. Had been off of medication for some time before having a significant amount of pain and swelling in the R ankle. Presented to office seeing Dr. Mardelle Matte on 10/17/17. Has taken Prednisone taper as directed and restarted allopurinol. Notes resolution of symptoms. Had some pain in L elbow that has also resolved.  Past Medical History:  Diagnosis Date  . Chicken pox   . Epigastric pain 04/05/2014  . Essential hypertension 05/02/2016  . Gout   . Headache disorder 2015   Tension w/medication overuse component suspected by neurologist (Dr. Everlena Cooper).  MRI brain ordered.  . Migraines     Current Outpatient Medications on File Prior to Visit  Medication Sig Dispense Refill  . allopurinol (ZYLOPRIM) 100 MG tablet Take 1 tablet (100 mg total) by mouth daily. 30 tablet 6  . losartan (COZAAR) 50 MG tablet Take 1 tablet (50 mg total) by mouth daily. 90 tablet 3  . pantoprazole (PROTONIX) 40 MG tablet Take 1 tablet (40 mg total) by mouth daily. (Patient not taking: Reported on 08/07/2017) 30 tablet 3   No current facility-administered medications on file prior to visit.     Allergies  Allergen Reactions  . Ciprofloxacin Itching    Family History  Problem Relation Age of Onset  . Diabetes Mother   . Hyperlipidemia Mother   . Hypertension Unknown        family history  . Prostate cancer Father        family history  . Heart disease Father   . Heart attack Father 75       Deceased  . Hyperlipidemia Father   . Heart disease Paternal Uncle   . Diabetes Sister        x3  . Cancer Other        Aunts & Uncles  . Cancer Other        Grandparents  . Heart attack Paternal Uncle   . Hyperlipidemia Sister   . Diabetes Brother        x1  . Hyperlipidemia Brother        x1    Social History   Socioeconomic History  . Marital status: Married    Spouse name: Not on file  .  Number of children: Not on file  . Years of education: Not on file  . Highest education level: Not on file  Occupational History  . Not on file  Social Needs  . Financial resource strain: Not on file  . Food insecurity:    Worry: Not on file    Inability: Not on file  . Transportation needs:    Medical: Not on file    Non-medical: Not on file  Tobacco Use  . Smoking status: Former Smoker    Years: 3.00    Last attempt to quit: 03/04/2014    Years since quitting: 3.6  . Smokeless tobacco: Never Used  Substance and Sexual Activity  . Alcohol use: Yes    Comment: social  . Drug use: No    Comment: former user  . Sexual activity: Yes    Birth control/protection: None    Comment: married  Lifestyle  . Physical activity:    Days per week: Not on file    Minutes per session: Not on file  . Stress: Not on file  Relationships  . Social connections:    Talks on phone: Not  on file    Gets together: Not on file    Attends religious service: Not on file    Active member of club or organization: Not on file    Attends meetings of clubs or organizations: Not on file    Relationship status: Not on file  Other Topics Concern  . 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   Grew up in [place]   Married   1 son   Review of Systems - See HPI.  All other ROS are negative.  BP 136/84   Pulse 85   Temp 98.5 F (36.9 C) (Oral)   Resp 16   Ht 5\' 7"  (1.702 m)   Wt 199 lb 6.4 oz (90.4 kg)   SpO2 96%   BMI 31.23 kg/m   Physical Exam  Constitutional: He is oriented to person, place, and time. He appears well-developed and well-nourished.  HENT:  Head: Normocephalic and atraumatic.  Neck: Neck supple.  Cardiovascular: Normal rate, regular rhythm, normal heart sounds and intact distal pulses.  Neurological: He is alert and oriented to person, place, and time.  Vitals reviewed.   Recent Results (from the past 2160 hour(s))  Basic  metabolic panel     Status: Abnormal   Collection Time: 08/07/17  8:18 AM  Result Value Ref Range   Sodium 137 135 - 145 mEq/L   Potassium 3.8 3.5 - 5.1 mEq/L   Chloride 99 96 - 112 mEq/L   CO2 30 19 - 32 mEq/L   Glucose, Bld 170 (H) 70 - 99 mg/dL   BUN 9 6 - 23 mg/dL   Creatinine, Ser 1.61 0.40 - 1.50 mg/dL   Calcium 9.3 8.4 - 09.6 mg/dL   GFR 04.54 >09.81 mL/min  CBC with Differential/Platelet     Status: None   Collection Time: 08/07/17  8:18 AM  Result Value Ref Range   WBC 6.3 4.0 - 10.5 K/uL   RBC 4.57 4.22 - 5.81 Mil/uL   Hemoglobin 14.0 13.0 - 17.0 g/dL   HCT 19.1 47.8 - 29.5 %   MCV 85.8 78.0 - 100.0 fl   MCHC 35.7 30.0 - 36.0 g/dL   RDW 62.1 30.8 - 65.7 %   Platelets 221.0 150.0 - 400.0 K/uL   Neutrophils Relative % 55.4 43.0 - 77.0 %   Lymphocytes Relative 35.2 12.0 - 46.0 %   Monocytes Relative 7.1 3.0 - 12.0 %   Eosinophils Relative 1.9 0.0 - 5.0 %   Basophils Relative 0.4 0.0 - 3.0 %   Neutro Abs 3.5 1.4 - 7.7 K/uL   Lymphs Abs 2.2 0.7 - 4.0 K/uL   Monocytes Absolute 0.4 0.1 - 1.0 K/uL   Eosinophils Absolute 0.1 0.0 - 0.7 K/uL   Basophils Absolute 0.0 0.0 - 0.1 K/uL  Uric acid     Status: Abnormal   Collection Time: 08/07/17  8:18 AM  Result Value Ref Range   Uric Acid, Serum 10.1 (H) 4.0 - 7.8 mg/dL  Hemoglobin Q4O     Status: None   Collection Time: 08/07/17  3:12 PM  Result Value Ref Range   Hgb A1c MFr Bld 5.9 4.6 - 6.5 %    Comment: Glycemic Control Guidelines for People with Diabetes:Non Diabetic:  <6%Goal of Therapy: <7%Additional Action Suggested:  >8%    Assessment/Plan: 1. Acute gout of right ankle, unspecified cause Resolved. Completed course of steroid. Will check uric acid level and BMP today  with him back on allopurinol 100 mg daily.  - Uric acid - Basic metabolic panel   Piedad ClimesWilliam Cody , PA-C

## 2017-10-29 NOTE — Patient Instructions (Signed)
Please continue allopurinol daily as directed. Avoid your trigger foods and keep well-hydrated.  Please go to the lab today for blood work.  I will call you with your results. We will alter treatment regimen(s) if indicated by your results.    Gout Gout is painful swelling that can happen in some of your joints. Gout is a type of arthritis. This condition is caused by having too much uric acid in your body. Uric acid is a chemical that is made when your body breaks down substances called purines. If your body has too much uric acid, sharp crystals can form and build up in your joints. This causes pain and swelling. Gout attacks can happen quickly and be very painful (acute gout). Over time, the attacks can affect more joints and happen more often (chronic gout). Follow these instructions at home: During a Gout Attack  If directed, put ice on the painful area: ? Put ice in a plastic bag. ? Place a towel between your skin and the bag. ? Leave the ice on for 20 minutes, 2-3 times a day.  Rest the joint as much as possible. If the joint is in your leg, you may be given crutches to use.  Raise (elevate) the painful joint above the level of your heart as often as you can.  Drink enough fluids to keep your pee (urine) clear or pale yellow.  Take over-the-counter and prescription medicines only as told by your doctor.  Do not drive or use heavy machinery while taking prescription pain medicine.  Follow instructions from your doctor about what you can or cannot eat and drink.  Return to your normal activities as told by your doctor. Ask your doctor what activities are safe for you. Avoiding Future Gout Attacks  Follow a low-purine diet as told by a specialist (dietitian) or your doctor. Avoid foods and drinks that have a lot of purines, such as: ? Liver. ? Kidney. ? Anchovies. ? Asparagus. ? Herring. ? Mushrooms ? Mussels. ? Beer.  Limit alcohol intake to no more than 1 drink a day  for nonpregnant women and 2 drinks a day for men. One drink equals 12 oz of beer, 5 oz of wine, or 1 oz of hard liquor.  Stay at a healthy weight or lose weight if you are overweight. If you want to lose weight, talk with your doctor. It is important that you do not lose weight too fast.  Start or continue an exercise plan as told by your doctor.  Drink enough fluids to keep your pee clear or pale yellow.  Take over-the-counter and prescription medicines only as told by your doctor.  Keep all follow-up visits as told by your doctor. This is important. Contact a doctor if:  You have another gout attack.  You still have symptoms of a gout attack after10 days of treatment.  You have problems (side effects) because of your medicines.  You have chills or a fever.  You have burning pain when you pee (urinate).  You have pain in your lower back or belly. Get help right away if:  You have very bad pain.  Your pain cannot be controlled.  You cannot pee. This information is not intended to replace advice given to you by your health care provider. Make sure you discuss any questions you have with your health care provider. Document Released: 11/28/2007 Document Revised: 07/27/2015 Document Reviewed: 12/01/2014 Elsevier Interactive Patient Education  [employer]2018 [employer].

## 2017-11-24 ENCOUNTER — Other Ambulatory Visit: Payer: Self-pay | Admitting: Physician Assistant

## 2017-11-24 DIAGNOSIS — I10 Essential (primary) hypertension: Secondary | ICD-10-CM

## 2017-11-24 MED FILL — ALLOPURINOL 100 MG TABLET: 100 | 30 days supply | Qty: 30 | Fill #2

## 2017-11-24 MED FILL — LOSARTAN POTASSIUM 50 MG TA: 50 | 90 days supply | Qty: 90 | Fill #1

## 2017-12-26 ENCOUNTER — Telehealth: Payer: Self-pay | Admitting: Physician Assistant

## 2017-12-26 NOTE — Telephone Encounter (Unsigned)
Copied from CRM [id number]. Topic: Quick Communication - Rx Refill/Question >> Dec 26, 2017  3:43 PM Mcneil, Ja-Kwan wrote: Medication: allopurinol (ZYLOPRIM) 300 MG tablet  Has the patient contacted their pharmacy? yes   Preferred Pharmacy (with phone number or street name): [facility] [address] [place] - [address] [phone] (Phone) [phone] (Fax)  Agent: Please be advised that RX refills may take up to 3 business days. We ask that you follow-up with your pharmacy.

## 2017-12-26 NOTE — Telephone Encounter (Signed)
Call to patient- advised he can call [facility] and have them request remaining refills from [facility] pharmacy. Patient reports he does not want to do that- just leave Rx where it is.

## 2018-01-02 MED FILL — ALLOPURINOL 100 MG TABLET: 100 | 30 days supply | Qty: 30 | Fill #2

## 2018-02-02 MED FILL — LOSARTAN POTASSIUM 50 MG TA: 50 | 30 days supply | Qty: 30 | Fill #1

## 2018-02-02 MED FILL — ALLOPURINOL 100 MG TABS: 100 | 30 days supply | Qty: 30 | Fill #3

## 2018-03-02 ENCOUNTER — Encounter: Payer: Self-pay | Admitting: Physician Assistant

## 2018-03-02 ENCOUNTER — Ambulatory Visit: Payer: BLUE CROSS/BLUE SHIELD | Admitting: Physician Assistant

## 2018-03-02 ENCOUNTER — Other Ambulatory Visit: Payer: Self-pay

## 2018-03-02 ENCOUNTER — Encounter: Payer: Self-pay | Admitting: *Deleted

## 2018-03-02 VITALS — BP 142/102 | HR 95 | Temp 98.3°F | Resp 16 | Ht 67.0 in | Wt 197.0 lb

## 2018-03-02 DIAGNOSIS — M109 Gout, unspecified: Secondary | ICD-10-CM

## 2018-03-02 MED ORDER — PREDNISONE 10 MG PO TABS: ORAL_TABLET | ORAL | 0 refills | Status: AC

## 2018-03-02 MED FILL — ALLOPURINOL 100 MG TABS: 100 | 30 days supply | Qty: 30 | Fill #4

## 2018-03-02 MED FILL — LOSARTAN POTASSIUM 50 MG TA: 50 | 30 days supply | Qty: 30 | Fill #2

## 2018-03-02 NOTE — Patient Instructions (Addendum)
Please take your BP medication as soon as you are home. We need this in your system so that you can take the steroids given. Continue the allopurinol. Take steroid taper as directed. Tylenol for breakthrough pain. Follow-up if not resolving.    Gout  Gout is painful swelling of your joints. Gout is a type of arthritis. It is caused by having too much uric acid in your body. Uric acid is a chemical that is made when your body breaks down substances called purines. If your body has too much uric acid, sharp crystals can form and build up in your joints. This causes pain and swelling. Gout attacks can happen quickly and be very painful (acute gout). Over time, the attacks can affect more joints and happen more often (chronic gout). What are the causes?  Too much uric acid in your blood. This can happen because: ? Your kidneys do not remove enough uric acid from your blood. ? Your body makes too much uric acid. ? You eat too many foods that are high in purines. These foods include organ meats, some seafood, and beer.  Trauma or stress. What increases the risk?  Having a family history of gout.  Being male and middle-aged.  Being male and having gone through menopause.  Being very overweight (obese).  Drinking alcohol, especially beer.  Not having enough water in the body (being dehydrated).  Losing weight too quickly.  Having an organ transplant.  Having lead poisoning.  Taking certain medicines.  Having kidney disease.  Having a skin condition called psoriasis. What are the signs or symptoms? An attack of acute gout usually happens in just one joint. The most common place is the big toe. Attacks often start at night. Other joints that may be affected include joints of the feet, ankle, knee, fingers, wrist, or elbow. Symptoms of an attack may include:  Very bad pain.  Warmth.  Swelling.  Stiffness.  Shiny, red, or purple skin.  Tenderness. The affected joint may  be very painful to touch.  Chills and fever. Chronic gout may cause symptoms more often. More joints may be involved. You may also have white or yellow lumps (tophi) on your hands or feet or in other areas near your joints. How is this treated?  Treatment for this condition has two phases: treating an acute attack and preventing future attacks.  Acute gout treatment may include: ? NSAIDs. ? Steroids. These are taken by mouth or injected into a joint. ? Colchicine. This medicine relieves pain and swelling. It can be given by mouth or through an IV tube.  Preventive treatment may include: ? Taking small doses of NSAIDs or colchicine daily. ? Using a medicine that reduces uric acid levels in your blood. ? Making changes to your diet. You may need to see a food expert (dietitian) about what to eat and drink to prevent gout. Follow these instructions at home: During a gout attack   If told, put ice on the painful area: ? Put ice in a plastic bag. ? Place a towel between your skin and the bag. ? Leave the ice on for 20 minutes, 2-3 times a day.  Raise (elevate) the painful joint above the level of your heart as often as you can.  Rest the joint as much as possible. If the joint is in your leg, you may be given crutches.  Follow instructions from your doctor about what you cannot eat or drink. Avoiding future gout attacks  Eat a  low-purine diet. Avoid foods and drinks such as: ? Liver. ? Kidney. ? Anchovies. ? Asparagus. ? Herring. ? Mushrooms. ? Mussels. ? Beer.  Stay at a healthy weight. If you want to lose weight, talk with your doctor. Do not lose weight too fast.  Start or continue an exercise plan as told by your doctor. Eating and drinking  Drink enough fluids to keep your pee (urine) pale yellow.  If you drink alcohol: ? Limit how much you use to:  0-1 drink a day for women.  0-2 drinks a day for men. ? Be aware of how much alcohol is in your drink. In the  [place] one drink equals one 12 oz bottle of beer (355 mL), one 5 oz glass of wine (148 mL), or one 1 oz glass of hard liquor (44 mL). General instructions  Take over-the-counter and prescription medicines only as told by your doctor.  Do not drive or use heavy machinery while taking prescription pain medicine.  Return to your normal activities as told by your doctor. Ask your doctor what activities are safe for you.  Keep all follow-up visits as told by your doctor. This is important. Contact a doctor if:  You have another gout attack.  You still have symptoms of a gout attack after 10 days of treatment.  You have problems (side effects) because of your medicines.  You have chills or a fever.  You have burning pain when you pee (urinate).  You have pain in your lower back or belly. Get help right away if:  You have very bad pain.  Your pain cannot be controlled.  You cannot pee. Summary  Gout is painful swelling of the joints.  The most common site of pain is the big toe, but it can affect other joints.  Medicines and avoiding some foods can help to prevent and treat gout attacks. This information is not intended to replace advice given to you by your health care provider. Make sure you discuss any questions you have with your health care provider. Document Released: 11/28/2007 Document Revised: 09/10/2017 Document Reviewed: 09/10/2017 Elsevier Interactive Patient Education  2019 [employer]

## 2018-03-02 NOTE — Progress Notes (Signed)
Patient presents to clinic today c/o 3.5 days or pain, redness and swelling in 2nd MTP joint of foot despite avoiding triggers and taking his allopurinol. Denies decreased AROM. Denies numbness, tingling, trauma or injury.  Past Medical History:  Diagnosis Date  . Chicken pox   . Epigastric pain 04/05/2014  . Essential hypertension 05/02/2016  . Gout   . Headache disorder 2015   Tension w/medication overuse component suspected by neurologist (Dr. Everlena CooperJaffe).  MRI brain ordered.  . Migraines     Current Outpatient Medications on File Prior to Visit  Medication Sig Dispense Refill  . allopurinol (ZYLOPRIM) 100 MG tablet Take 1 tablet (100 mg total) by mouth daily. 30 tablet 6  . losartan (COZAAR) 50 MG tablet Take 1 tablet (50 mg total) by mouth daily. 90 tablet 3  . pantoprazole (PROTONIX) 40 MG tablet Take 1 tablet (40 mg total) by mouth daily. 90 tablet 1   No current facility-administered medications on file prior to visit.     Allergies  Allergen Reactions  . Ciprofloxacin Itching    Family History  Problem Relation Age of Onset  . Diabetes Mother   . Hyperlipidemia Mother   . Hypertension Other        family history  . Prostate cancer Father        family history  . Heart disease Father   . Heart attack Father [id number]       Deceased  . Hyperlipidemia Father   . Heart disease Paternal Uncle   . Diabetes Sister        x3  . Cancer Other        Aunts & Uncles  . Cancer Other        Grandparents  . Heart attack Paternal Uncle   . Hyperlipidemia Sister   . Diabetes Brother        x1  . Hyperlipidemia Brother        x1    Social History   Socioeconomic History  . Marital status: Married    Spouse name: Not on file  . Number of children: Not on file  . Years of education: Not on file  . Highest education level: Not on file  Occupational History  . Not on file  Social Needs  . Financial resource strain: Not on file  . Food insecurity:    Worry: Not on file   Inability: Not on file  . Transportation needs:    Medical: Not on file    Non-medical: Not on file  Tobacco Use  . Smoking status: Former Smoker    Years: 3.00    Last attempt to quit: 03/04/2014    Years since quitting: 3.9  . Smokeless tobacco: Never Used  Substance and Sexual Activity  . Alcohol use: Yes    Comment: social  . Drug use: No    Comment: former user  . Sexual activity: Yes    Birth control/protection: None    Comment: married  Lifestyle  . Physical activity:    Days per week: Not on file    Minutes per session: Not on file  . Stress: Not on file  Relationships  . Social connections:    Talks on phone: Not on file    Gets together: Not on file    Attends religious service: Not on file    Active member of club or organization: Not on file    Attends meetings of clubs or organizations: Not on file  Relationship status: Not on file  Other Topics Concern  . 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   [place] up in [place]   Married   1 son   Review of Systems - See HPI.  All other ROS are negative.  BP (!) 142/102   Pulse 95   Temp 98.3 F (36.8 C) (Oral)   Resp 16   Ht 5\' 7"  (1.702 m)   Wt 197 lb (89.4 kg)   SpO2 98%   BMI 30.85 kg/m   Physical Exam Vitals signs reviewed.  Constitutional:      Appearance: Normal appearance.  HENT:     Head: Normocephalic and atraumatic.     Right Ear: Tympanic membrane normal.     Left Ear: Tympanic membrane normal.     Nose: Nose normal.     Mouth/Throat:     Mouth: Mucous membranes are moist.  Eyes:     Conjunctiva/sclera: Conjunctivae normal.  Neck:     Musculoskeletal: Normal range of motion and neck supple.  Cardiovascular:     Rate and Rhythm: Normal rate and regular rhythm.     Pulses: Normal pulses.     Heart sounds: Normal heart sounds.  Neurological:     General: No focal deficit present.     Mental Status: He is alert and oriented to person,  place, and time.  Psychiatric:        Mood and Affect: Mood normal.    Assessment/Plan: 1. Acute gout involving toe of right foot, unspecified cause BP significantly elevated today 2/2 pain and that he has not taken his BP medication. Asymptomatic thankfully. Too high for steroid shot in office. Avoiding NSAID due to history. Will have him take his BP medication as soon as he is home. Prednisone taper written to take as directed, keeping eye on BP levels. Follow-up if not resolving. He has been instructed to continue his allopurinol as directed.   Piedad ClimesWilliam Cody , PA-C

## 2018-04-09 MED FILL — ALLOPURINOL 100 MG TABS: 100 | 30 days supply | Qty: 30 | Fill #5

## 2018-04-09 MED FILL — LOSARTAN POTASSIUM 50 MG TA: 50 | 30 days supply | Qty: 30 | Fill #3

## 2018-05-13 MED FILL — ALLOPURINOL 100 MG TABS: 100 | 30 days supply | Qty: 30 | Fill #6

## 2018-05-13 MED FILL — LOSARTAN POTASSIUM 50 MG TA: 50 | 30 days supply | Qty: 30 | Fill #4

## 2019-05-31 ENCOUNTER — Telehealth: Payer: Self-pay | Admitting: Physician Assistant
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
## 2019-06-01 ENCOUNTER — Other Ambulatory Visit: Payer: Self-pay | Admitting: Physician Assistant

## 2019-06-01 NOTE — Telephone Encounter (Signed)
I am fine writing the letter for him. He can pick up tomorrow morning.

## 2019-06-01 NOTE — Telephone Encounter (Signed)
Patient calling back to check status. He states if we cannot do it to let him know so he can move on. Please advise if this can be done.

## 2019-06-01 NOTE — Telephone Encounter (Signed)
Called patient to make him aware that his letter was ready for pickup. Patient voiced understanding and stated he would come by to pick it up in the morning.

## 2019-06-28 ENCOUNTER — Other Ambulatory Visit: Payer: Self-pay

## 2019-06-28 ENCOUNTER — Emergency Department (HOSPITAL_COMMUNITY)
Admission: EM | Admit: 2019-06-28 | Discharge: 2019-06-28 | Disposition: A | Discharge status: Home / Self Care | Payer: Self-pay | Attending: Emergency Medicine | Admitting: Emergency Medicine

## 2019-06-28 DIAGNOSIS — M109 Gout, unspecified: Secondary | ICD-10-CM | POA: Insufficient documentation

## 2019-06-28 DIAGNOSIS — Z87891 Personal history of nicotine dependence: Secondary | ICD-10-CM | POA: Insufficient documentation

## 2019-06-28 DIAGNOSIS — I10 Essential (primary) hypertension: Secondary | ICD-10-CM | POA: Insufficient documentation

## 2019-06-28 DIAGNOSIS — Z79899 Other long term (current) drug therapy: Secondary | ICD-10-CM | POA: Insufficient documentation

## 2019-06-28 MED ORDER — ALLOPURINOL 100 MG PO TABS: 100.0000 mg | ORAL_TABLET | Freq: Every day | ORAL | 6 refills | Status: DC

## 2019-06-28 MED ORDER — HYDROCODONE-ACETAMINOPHEN 5-325 MG PO TABS: 1.0000 | ORAL_TABLET | ORAL | 0 refills | Status: DC | PRN

## 2019-06-28 MED ORDER — PREDNISONE 10 MG PO TABS: ORAL_TABLET | ORAL | 0 refills | Status: DC

## 2019-06-28 NOTE — Discharge Instructions (Signed)
Return if any problems.

## 2019-06-28 NOTE — ED Notes (Signed)
Patient verbalizes understanding of discharge instructions . Opportunity for questions and answers were provided . Armband removed by staff ,Pt discharged from ED. W/C  offered at D/C  and Declined W/C at D/C and was escorted to lobby by RN.  

## 2019-06-28 NOTE — ED Triage Notes (Signed)
Pt here for evaluation of gout flare up in L foot. Out of allopurinol at home. Taking OTC meds without relief. Also took an old prednisone he had left over.

## 2019-06-28 NOTE — ED Provider Notes (Signed)
[facility] EMERGENCY DEPARTMENT Provider Note   CSN: [id number] Arrival date & time: 06/28/19  1023     History Chief Complaint  Patient presents with  . Gout    Anthony Gill is a 49 y.o. male.  The history is provided by the patient. No language interpreter was used.  Toe Pain This is a new problem. The problem occurs constantly. The problem has been gradually worsening. Nothing aggravates the symptoms. Nothing relieves the symptoms. He has tried nothing for the symptoms. The treatment provided no relief.   Pt reports he has a history of gout.  Pt reports pain and swelling Pt is out of gout medication    Past Medical History:  Diagnosis Date  . Chicken pox   . Epigastric pain 04/05/2014  . Essential hypertension 05/02/2016  . Gout   . Headache disorder 2015   Tension w/medication overuse component suspected by neurologist (Dr. Tomi Likens).  MRI brain ordered.  . Migraines     Patient Active Problem List   Diagnosis Date Noted  . Essential hypertension 05/02/2016  . Family history of early CAD 05/02/2016  . PUD (peptic ulcer disease) 03/14/2014  . GERD (gastroesophageal reflux disease) 06/09/2013  . Gout 09/15/2011    Past Surgical History:  Procedure Laterality Date  . COLONOSCOPY N/A 04/10/2014   Procedure: COLONOSCOPY;  Surgeon: Missy Sabins, MD;  Location: [facility];  Service: Endoscopy;  Laterality: N/A;  . ESOPHAGOGASTRODUODENOSCOPY N/A 04/10/2014   Procedure: ESOPHAGOGASTRODUODENOSCOPY (EGD);  Surgeon: Missy Sabins, MD;  Location: [facility] ENDOSCOPY;  Service: Endoscopy;  Laterality: N/A;  . HERNIA REPAIR  2006  . WISDOM TOOTH EXTRACTION         Family History  Problem Relation Age of Onset  . Diabetes Mother   . Hyperlipidemia Mother   . Hypertension Other        family history  . Prostate cancer Father        family history  . Heart disease Father   . Heart attack Father 29       Deceased  . Hyperlipidemia Father   . Heart disease  Paternal Uncle   . Diabetes Sister        x3  . Cancer Other        Aunts & Uncles  . Cancer Other        Grandparents  . Heart attack Paternal Uncle   . Hyperlipidemia Sister   . Diabetes Brother        x1  . Hyperlipidemia Brother        x1    Social History   Tobacco Use  . Smoking status: Former Smoker    Years: 3.00    Quit date: 03/04/2014    Years since quitting: 5.3  . Smokeless tobacco: Never Used  Substance Use Topics  . Alcohol use: Yes    Comment: social  . Drug use: No    Comment: former user    Home Medications Prior to Admission medications   Medication Sig Start Date End Date Taking? Authorizing Provider  allopurinol (ZYLOPRIM) 100 MG tablet Take 1 tablet (100 mg total) by mouth daily. 08/07/17   Midge Minium, MD  losartan (COZAAR) 50 MG tablet Take 1 tablet (50 mg total) by mouth daily. 08/07/17   Midge Minium, MD  pantoprazole (PROTONIX) 40 MG tablet Take 1 tablet (40 mg total) by mouth daily. 10/29/17   Brunetta Jeans, PA-C    Allergies  Ciprofloxacin  Review of Systems   Review of Systems  All other systems reviewed and are negative.   Physical Exam Updated Vital Signs BP (!) 145/104 (BP Location: Left Arm)   Pulse 89   Temp 98.3 F (36.8 C) (Oral)   Resp 15   SpO2 98%   Physical Exam Vitals reviewed.  Constitutional:      Appearance: Normal appearance.  Musculoskeletal:        General: Swelling and tenderness present.     Comments: Swollen 1st toe, redness, nv and ns intact   Skin:    General: Skin is warm.  Neurological:     General: No focal deficit present.     Mental Status: He is alert.  Psychiatric:        Mood and Affect: Mood normal.     ED Results / Procedures / Treatments   Labs (all labs ordered are listed, but only abnormal results are displayed) Labs Reviewed - No data to display  EKG None  Radiology No results found.  Procedures Procedures (including critical care time)  Medications  Ordered in ED Medications - No data to display  ED Course  I have reviewed the triage vital signs and the nursing notes.  Pertinent labs & imaging results that were available during my care of the patient were reviewed by me and considered in my medical decision making (see chart for details).    MDM Rules/Calculators/A&P                      MDM: Pt advised to modify diet. Pt given rx for medications Final Clinical Impression(s) / ED Diagnoses Final diagnoses:  Acute gout involving toe of left foot, unspecified cause    Rx / DC Orders ED Discharge Orders         Ordered    predniSONE (DELTASONE) 10 MG tablet     06/28/19 1322    allopurinol (ZYLOPRIM) 100 MG tablet  Daily     06/28/19 1322    HYDROcodone-acetaminophen (NORCO/VICODIN) 5-325 MG tablet  Every 4 hours PRN     06/28/19 1322        An After Visit Summary was printed and given to the patient.   Elson Areas, [place] 06/28/19 1347    Gwyneth Sprout, MD 06/28/19 1546

## 2019-07-12 ENCOUNTER — Ambulatory Visit (INDEPENDENT_AMBULATORY_CARE_PROVIDER_SITE_OTHER): Payer: 59 | Admitting: Family Medicine

## 2019-07-12 ENCOUNTER — Encounter: Payer: Self-pay | Admitting: Family Medicine

## 2019-07-12 ENCOUNTER — Other Ambulatory Visit: Payer: Self-pay

## 2019-07-12 ENCOUNTER — Telehealth: Payer: Self-pay | Admitting: Physician Assistant

## 2019-07-12 VITALS — BP 199/108 | HR 90 | Temp 98.7°F | Resp 16 | Wt 209.0 lb

## 2019-07-12 DIAGNOSIS — I10 Essential (primary) hypertension: Secondary | ICD-10-CM

## 2019-07-12 LAB — CBC WITH DIFFERENTIAL/PLATELET
Basophils Absolute: 0 10*3/uL (ref 0.0–0.1)
Basophils Relative: 0.3 % (ref 0.0–3.0)
Eosinophils Absolute: 0.1 10*3/uL (ref 0.0–0.7)
Eosinophils Relative: 0.9 % (ref 0.0–5.0)
HCT: 40.6 % (ref 39.0–52.0)
Hemoglobin: 14.5 g/dL (ref 13.0–17.0)
Lymphocytes Relative: 31.8 % (ref 12.0–46.0)
Lymphs Abs: 1.7 10*3/uL (ref 0.7–4.0)
MCHC: 35.8 g/dL (ref 30.0–36.0)
MCV: 86.6 fl (ref 78.0–100.0)
Monocytes Absolute: 0.2 10*3/uL (ref 0.1–1.0)
Monocytes Relative: 4.2 % (ref 3.0–12.0)
Neutro Abs: 3.4 10*3/uL (ref 1.4–7.7)
Neutrophils Relative %: 62.8 % (ref 43.0–77.0)
Platelets: 222 10*3/uL (ref 150.0–400.0)
RBC: 4.69 Mil/uL (ref 4.22–5.81)
RDW: 13.6 % (ref 11.5–15.5)
WBC: 5.5 10*3/uL (ref 4.0–10.5)

## 2019-07-12 LAB — BASIC METABOLIC PANEL
BUN: 6 mg/dL (ref 6–23)
CO2: 26 mEq/L (ref 19–32)
Calcium: 9.4 mg/dL (ref 8.4–10.5)
Chloride: 99 mEq/L (ref 96–112)
Creatinine, Ser: 1.05 mg/dL (ref 0.40–1.50)
GFR: 91.64 mL/min (ref >60.00)
Glucose, Bld: 190 mg/dL — ABNORMAL HIGH (ref 70–99)
Potassium: 3.5 mEq/L (ref 3.5–5.1)
Sodium: 135 mEq/L (ref 135–145)

## 2019-07-12 LAB — HEPATIC FUNCTION PANEL
ALT: 20 U/L (ref 0–53)
AST: 21 U/L (ref 0–37)
Albumin: 4.5 g/dL (ref 3.5–5.2)
Alkaline Phosphatase: 88 U/L (ref 39–117)
Bilirubin, Direct: 0.1 mg/dL (ref 0.0–0.3)
Total Bilirubin: 0.3 mg/dL (ref 0.2–1.2)
Total Protein: 7.3 g/dL (ref 6.0–8.3)

## 2019-07-12 LAB — LIPID PANEL
Cholesterol: 177 mg/dL (ref 0–200)
HDL: 25.8 mg/dL — ABNORMAL LOW (ref >39.00)
Total CHOL/HDL Ratio: 7
Triglycerides: 1201 mg/dL — ABNORMAL HIGH (ref 0.0–149.0)

## 2019-07-12 LAB — TSH: TSH: 0.82 u[IU]/mL (ref 0.35–4.50)

## 2019-07-12 LAB — LDL CHOLESTEROL, DIRECT: Direct LDL: 37 mg/dL

## 2019-07-12 MED ORDER — LOSARTAN POTASSIUM 100 MG PO TABS: 100.0000 mg | ORAL_TABLET | Freq: Every day | ORAL | 0 refills | Status: DC

## 2019-07-12 MED ORDER — AMLODIPINE BESYLATE 5 MG PO TABS
5.0000 mg | ORAL_TABLET | Freq: Every day | ORAL | 0 refills | Status: DC
Start: 2019-07-12 — End: 2019-08-04

## 2019-07-12 NOTE — Assessment & Plan Note (Signed)
Deteriorated.  Pt's BP is quite high and has been for the last 2 weeks.  When questioned about his prescription that has not been written since 2019, pt reaffirms that he is taking his Losartan.  Will increase Losartan to 100mg  daily and add Amlodipine 5mg  daily.  Close follow up w/ PCP.  Will get labs today to assess baseline

## 2019-07-12 NOTE — Progress Notes (Signed)
   Subjective:    Patient ID: Anthony Gill, male    DOB: [birth year], 49 y.o.   MRN: [id number]  HPI HTN- pt's BP is quite elevated today.  On Losartan 50mg  but last prescription was written on 6/619.  States he has been taking Losartan regularly for over 1 month but [facility] has never filled Losartan and [facility] last filled 05/13/2018 #30 no refills. First noticed elevated BP 2 weeks ago.  This AM was 263/122.  Reports that he is noticing some SOB when climbing stairs.  Also reports some visual changes- will 'see dots'  No CP.  No swelling of hands/feet.  Recently treated w/ Prednisone for gout flare.  No recent headaches.    Review of Systems For ROS see HPI   This visit occurred during the SARS-CoV-2 public health emergency.  Safety protocols were in place, including screening questions prior to the visit, additional usage of staff PPE, and extensive cleaning of exam room while observing appropriate contact time as indicated for disinfecting solutions.       Objective:   Physical Exam Vitals reviewed.  Constitutional:      General: He is not in acute distress.    Appearance: He is well-developed. He is obese. He is not ill-appearing or toxic-appearing.  HENT:     Head: Normocephalic and atraumatic.  Eyes:     Conjunctiva/sclera: Conjunctivae normal.     Pupils: Pupils are equal, round, and reactive to light.  Neck:     Thyroid: No thyromegaly.  Cardiovascular:     Rate and Rhythm: Normal rate and regular rhythm.     Heart sounds: Normal heart sounds. No murmur.  Pulmonary:     Effort: Pulmonary effort is normal. No respiratory distress.     Breath sounds: Normal breath sounds.  Abdominal:     General: Bowel sounds are normal. There is no distension.     Palpations: Abdomen is soft.  Musculoskeletal:     Cervical back: Normal range of motion and neck supple.  Lymphadenopathy:     Cervical: No cervical adenopathy.  Skin:    General: Skin is warm and dry.  Neurological:       Mental Status: He is alert and oriented to person, place, and time.     Cranial Nerves: No cranial nerve deficit.  Psychiatric:        Behavior: Behavior normal.           Assessment & Plan:

## 2019-07-12 NOTE — Telephone Encounter (Signed)
Pt called the after hours stating his BP was 263/122, they advised pt to go the ER. Pt didn't go. Pt denies HA, Dizziness, CP, SOB or swelling. He took 2 Losartan and understands to go to the ER if any symptoms occur. Pt has been scheduled to see Tabori at 1pm.

## 2019-07-12 NOTE — Patient Instructions (Signed)
Follow up next week with Anthony Gill to recheck BP We'll notify you of your lab results and make any changes if needed START the Losartan 100mg  daily (the prescription was sent to [facility]) ADD the Amlodipine 5mg  daily (prescription also sent) INCREASE your water intake LIMIT your salt intake Call with any questions or concerns Hang in there!

## 2019-07-13 ENCOUNTER — Other Ambulatory Visit (INDEPENDENT_AMBULATORY_CARE_PROVIDER_SITE_OTHER): Payer: 59

## 2019-07-13 DIAGNOSIS — R7309 Other abnormal glucose: Secondary | ICD-10-CM

## 2019-07-13 LAB — HEMOGLOBIN A1C: Hgb A1c MFr Bld: 6.3 % (ref 4.6–6.5)

## 2019-07-13 NOTE — Addendum Note (Signed)
Addended by: Miguel Aschoff on: 07/13/2019 09:42 AM   Modules accepted: Orders

## 2019-07-16 ENCOUNTER — Telehealth: Payer: Self-pay

## 2019-07-16 NOTE — Telephone Encounter (Signed)
Have him continue DASH diet. Continue losartan at current dose. Would have him take 1.5 tablets (7.5 mg) amlodipine over the weekend. Follow-up Monday as scheduled for re-evaluation. I will review labs with him at that time.   ER over the weekend for any Bp > 200 systolic, or any development of chest pain, racing heart, SOB, LH or dizziness.

## 2019-07-16 NOTE — Telephone Encounter (Signed)
Called patient to check on his blood pressure numbers. Patient states his blood pressure this morning was 169/110. He denies any symptoms and states he feels good. He states yesterday he felt a little sluggish but his top number was in the 200's.Patient states he is tolerating the medication but it is not lowering his numbers. He also has questions regarding his blood work. He states someone called him regarding his high cholesterol numbers but he is wondering what he is supposed to do about them. I informed the patient that nothing was started because he was going to follow up with his primary care provider and he would address his cholesterol and make his recommendations. Patient would like a call back so he can know what he needs to do. Please advise.

## 2019-07-16 NOTE — Telephone Encounter (Signed)
Called patient and left a vm message for him to call the office back.

## 2019-07-16 NOTE — Telephone Encounter (Signed)
Patient returned call and was informed of PCP recommendations. Patient voiced understanding.

## 2019-07-19 ENCOUNTER — Ambulatory Visit (INDEPENDENT_AMBULATORY_CARE_PROVIDER_SITE_OTHER): Payer: 59 | Admitting: Physician Assistant

## 2019-07-19 ENCOUNTER — Encounter: Payer: Self-pay | Admitting: Physician Assistant

## 2019-07-19 ENCOUNTER — Other Ambulatory Visit: Payer: Self-pay

## 2019-07-19 VITALS — BP 142/98 | HR 85 | Temp 98.7°F | Resp 16 | Ht 67.0 in | Wt 209.0 lb

## 2019-07-19 DIAGNOSIS — I10 Essential (primary) hypertension: Secondary | ICD-10-CM

## 2019-07-19 DIAGNOSIS — E781 Pure hyperglyceridemia: Secondary | ICD-10-CM | POA: Diagnosis not present

## 2019-07-19 DIAGNOSIS — M1A0790 Idiopathic chronic gout, unspecified ankle and foot, without tophus (tophi): Secondary | ICD-10-CM

## 2019-07-19 NOTE — Patient Instructions (Addendum)
Please go to the lab today for blood work.  I will call you with your results. We will alter treatment regimen(s) if indicated by your results.   I want you to continue current regimen for now, making sure to only take 1.5 tablets of your amlodipine daily.  Check BP at home every day at least 2 hours after taking medicine and record. Follow the dietary recommendations below.  We are also rechecking your uric acid level for the chronic gout to see if we need to adjust the dose of the allopurinol.   Let's follow-up in 2 weeks and can do your complete physical that day. Come fasting so we can recheck your cholesterol panel.    DASH Eating Plan DASH stands for "Dietary Approaches to Stop Hypertension." The DASH eating plan is a healthy eating plan that has been shown to reduce high blood pressure (hypertension). It may also reduce your risk for type 2 diabetes, heart disease, and stroke. The DASH eating plan may also help with weight loss. What are tips for following this plan?  General guidelines  Avoid eating more than 2,300 mg (milligrams) of salt (sodium) a day. If you have hypertension, you may need to reduce your sodium intake to 1,500 mg a day.  Limit alcohol intake to no more than 1 drink a day for nonpregnant women and 2 drinks a day for men. One drink equals 12 oz of beer, 5 oz of wine, or 1 oz of hard liquor.  Work with your health care provider to maintain a healthy body weight or to lose weight. Ask what an ideal weight is for you.  Get at least 30 minutes of exercise that causes your heart to beat faster (aerobic exercise) most days of the week. Activities may include walking, swimming, or biking.  Work with your health care provider or diet and nutrition specialist (dietitian) to adjust your eating plan to your individual calorie needs. Reading food labels   Check food labels for the amount of sodium per serving. Choose foods with less than 5 percent of the Daily Value of  sodium. Generally, foods with less than 300 mg of sodium per serving fit into this eating plan.  To find whole grains, look for the word "whole" as the first word in the ingredient list. Shopping  Buy products labeled as "low-sodium" or "no salt added."  Buy fresh foods. Avoid canned foods and premade or frozen meals. Cooking  Avoid adding salt when cooking. Use salt-free seasonings or herbs instead of table salt or sea salt. Check with your health care provider or pharmacist before using salt substitutes.  Do not fry foods. Cook foods using healthy methods such as baking, boiling, grilling, and broiling instead.  Cook with heart-healthy oils, such as olive, canola, soybean, or sunflower oil. Meal planning  Eat a balanced diet that includes: ? 5 or more servings of fruits and vegetables each day. At each meal, try to fill half of your plate with fruits and vegetables. ? Up to 6-8 servings of whole grains each day. ? Less than 6 oz of lean meat, poultry, or fish each day. A 3-oz serving of meat is about the same size as a deck of cards. One egg equals 1 oz. ? 2 servings of low-fat dairy each day. ? A serving of nuts, seeds, or beans 5 times each week. ? Heart-healthy fats. Healthy fats called Omega-3 fatty acids are found in foods such as flaxseeds and coldwater fish, like sardines, salmon, and  mackerel.  Limit how much you eat of the following: ? Canned or prepackaged foods. ? Food that is high in trans fat, such as fried foods. ? Food that is high in saturated fat, such as fatty meat. ? Sweets, desserts, sugary drinks, and other foods with added sugar. ? Full-fat dairy products.  Do not salt foods before eating.  Try to eat at least 2 vegetarian meals each week.  Eat more home-cooked food and less restaurant, buffet, and fast food.  When eating at a restaurant, ask that your food be prepared with less salt or no salt, if possible. What foods are recommended? The items listed  may not be a complete list. Talk with your dietitian about what dietary choices are best for you. Grains Whole-grain or whole-wheat bread. Whole-grain or whole-wheat pasta. Brown rice. [place]. Bulgur. Whole-grain and low-sodium cereals. Pita bread. Low-fat, low-sodium crackers. Whole-wheat flour tortillas. Vegetables Fresh or frozen vegetables (raw, steamed, roasted, or grilled). Low-sodium or reduced-sodium tomato and vegetable juice. Low-sodium or reduced-sodium tomato sauce and tomato paste. Low-sodium or reduced-sodium canned vegetables. Fruits All fresh, dried, or frozen fruit. Canned fruit in natural juice (without added sugar). Meat and other protein foods Skinless chicken or [place]. Ground chicken or [place]. Pork with fat trimmed off. Fish and seafood. Egg whites. Dried beans, peas, or lentils. Unsalted nuts, nut butters, and seeds. Unsalted canned beans. Lean cuts of beef with fat trimmed off. Low-sodium, lean deli meat. Dairy Low-fat (1%) or fat-free (skim) milk. Fat-free, low-fat, or reduced-fat cheeses. Nonfat, low-sodium ricotta or cottage cheese. Low-fat or nonfat yogurt. Low-fat, low-sodium cheese. Fats and oils Soft margarine without trans fats. Vegetable oil. Low-fat, reduced-fat, or light mayonnaise and salad dressings (reduced-sodium). Canola, safflower, olive, soybean, and sunflower oils. Avocado. Seasoning and other foods Herbs. Spices. Seasoning mixes without salt. Unsalted popcorn and pretzels. Fat-free sweets. What foods are not recommended? The items listed may not be a complete list. Talk with your dietitian about what dietary choices are best for you. Grains Baked goods made with fat, such as croissants, muffins, or some breads. Dry pasta or rice meal packs. Vegetables Creamed or fried vegetables. Vegetables in a cheese sauce. Regular canned vegetables (not low-sodium or reduced-sodium). Regular canned tomato sauce and paste (not low-sodium or reduced-sodium).  Regular tomato and vegetable juice (not low-sodium or reduced-sodium). Angie Fava. Olives. Fruits Canned fruit in a light or heavy syrup. Fried fruit. Fruit in cream or butter sauce. Meat and other protein foods Fatty cuts of meat. Ribs. Fried meat. Berniece Salines. Sausage. Bologna and other processed lunch meats. Salami. Fatback. Hotdogs. Bratwurst. Salted nuts and seeds. Canned beans with added salt. Canned or smoked fish. Whole eggs or egg yolks. Chicken or [place] with skin. Dairy Whole or 2% milk, cream, and half-and-half. Whole or full-fat cream cheese. Whole-fat or sweetened yogurt. Full-fat cheese. Nondairy creamers. Whipped toppings. Processed cheese and cheese spreads. Fats and oils Butter. Stick margarine. Lard. Shortening. Ghee. Bacon fat. Tropical oils, such as coconut, palm kernel, or palm oil. Seasoning and other foods Salted popcorn and pretzels. Onion salt, garlic salt, seasoned salt, table salt, and sea salt. Worcestershire sauce. Tartar sauce. Barbecue sauce. Teriyaki sauce. Soy sauce, including reduced-sodium. Steak sauce. Canned and packaged gravies. Fish sauce. Oyster sauce. Cocktail sauce. Horseradish that you find on the shelf. Ketchup. Mustard. Meat flavorings and tenderizers. Bouillon cubes. Hot sauce and Tabasco sauce. Premade or packaged marinades. Premade or packaged taco seasonings. Relishes. Regular salad dressings. Where to find more information:  National Heart, Lung,  and Blood Institute: PopSteam.is  American Heart Association: www.heart.org Summary  The DASH eating plan is a healthy eating plan that has been shown to reduce high blood pressure (hypertension). It may also reduce your risk for type 2 diabetes, heart disease, and stroke.  With the DASH eating plan, you should limit salt (sodium) intake to 2,300 mg a day. If you have hypertension, you may need to reduce your sodium intake to 1,500 mg a day.  When on the DASH eating plan, aim to eat more fresh fruits and  vegetables, whole grains, lean proteins, low-fat dairy, and heart-healthy fats.  Work with your health care provider or diet and nutrition specialist (dietitian) to adjust your eating plan to your individual calorie needs. This information is not intended to replace advice given to you by your health care provider. Make sure you discuss any questions you have with your health care provider. Document Revised: 01/31/2017 Document Reviewed: 02/12/2016 Elsevier Patient Education  2020 Elsevier Inc.   Prediabetes Eating Plan Prediabetes is a condition that causes blood sugar (glucose) levels to be higher than normal. This increases the risk for developing diabetes. In order to prevent diabetes from developing, your health care provider may recommend a diet and other lifestyle changes to help you:  Control your blood glucose levels.  Improve your cholesterol levels.  Manage your blood pressure. Your health care provider may recommend working with a diet and nutrition specialist (dietitian) to make a meal plan that is best for you. What are tips for following this plan? Lifestyle  Set weight loss goals with the help of your health care team. It is recommended that most people with prediabetes lose 7% of their current body weight.  Exercise for at least 30 minutes at least 5 days a week.  Attend a support group or seek ongoing support from a mental health counselor.  Take over-the-counter and prescription medicines only as told by your health care provider. Reading food labels  Read food labels to check the amount of fat, salt (sodium), and sugar in prepackaged foods. Avoid foods that have: ? Saturated fats. ? Trans fats. ? Added sugars.  Avoid foods that have more than 300 milligrams (mg) of sodium per serving. Limit your daily sodium intake to less than 2,300 mg each day. Shopping  Avoid buying pre-made and processed foods. Cooking  Cook with olive oil. Do not use butter, lard, or  ghee.  Bake, broil, grill, or boil foods. Avoid frying. Meal planning   Work with your dietitian to develop an eating plan that is right for you. This may include: ? Tracking how many calories you take in. Use a food diary, notebook, or mobile application to track what you eat at each meal. ? Using the glycemic index (GI) to plan your meals. The index tells you how quickly a food will raise your blood glucose. Choose low-GI foods. These foods take a longer time to raise blood glucose.  Consider following a Mediterranean diet. This diet includes: ? Several servings each day of fresh fruits and vegetables. ? Eating fish at least twice a week. ? Several servings each day of whole grains, beans, nuts, and seeds. ? Using olive oil instead of other fats. ? Moderate alcohol consumption. ? Eating small amounts of red meat and whole-fat dairy.  If you have high blood pressure, you may need to limit your sodium intake or follow a diet such as the DASH eating plan. DASH is an eating plan that aims to lower  high blood pressure. What foods are recommended? The items listed below may not be a complete list. Talk with your dietitian about what dietary choices are best for you. Grains Whole grains, such as whole-wheat or whole-grain breads, crackers, cereals, and pasta. Unsweetened oatmeal. Bulgur. Barley. Quinoa. Brown rice. Corn or whole-wheat flour tortillas or taco shells. Vegetables Lettuce. Spinach. Peas. Beets. Cauliflower. Cabbage. Broccoli. Carrots. Tomatoes. Squash. Eggplant. Herbs. Peppers. Onions. Cucumbers. Brussels sprouts. Fruits Berries. Bananas. Apples. Oranges. Grapes. Papaya. Mango. Pomegranate. Kiwi. Grapefruit. Cherries. Meats and other protein foods Seafood. Poultry without skin. Lean cuts of pork and beef. Tofu. Eggs. Nuts. Beans. Dairy Low-fat or fat-free dairy products, such as yogurt, cottage cheese, and cheese. Beverages Water. Tea. Coffee. Sugar-free or diet soda. Seltzer  water. Lowfat or no-fat milk. Milk alternatives, such as soy or almond milk. Fats and oils Olive oil. Canola oil. Sunflower oil. Grapeseed oil. Avocado. Walnuts. Sweets and desserts Sugar-free or low-fat pudding. Sugar-free or low-fat ice cream and other frozen treats. Seasoning and other foods Herbs. Sodium-free spices. Mustard. Relish. Low-fat, low-sugar ketchup. Low-fat, low-sugar barbecue sauce. Low-fat or fat-free mayonnaise. What foods are not recommended? The items listed below may not be a complete list. Talk with your dietitian about what dietary choices are best for you. Grains Refined white flour and flour products, such as bread, pasta, snack foods, and cereals. Vegetables Canned vegetables. Frozen vegetables with butter or cream sauce. Fruits Fruits canned with syrup. Meats and other protein foods Fatty cuts of meat. Poultry with skin. Breaded or fried meat. Processed meats. Dairy Full-fat yogurt, cheese, or milk. Beverages Sweetened drinks, such as sweet iced tea and soda. Fats and oils Butter. Lard. Ghee. Sweets and desserts Baked goods, such as cake, cupcakes, pastries, cookies, and cheesecake. Seasoning and other foods Spice mixes with added salt. Ketchup. Barbecue sauce. Mayonnaise. Summary  To prevent diabetes from developing, you may need to make diet and other lifestyle changes to help control blood sugar, improve cholesterol levels, and manage your blood pressure.  Set weight loss goals with the help of your health care team. It is recommended that most people with prediabetes lose 7 percent of their current body weight.  Consider following a Mediterranean diet that includes plenty of fresh fruits and vegetables, whole grains, beans, nuts, seeds, fish, lean meat, low-fat dairy, and healthy oils. This information is not intended to replace advice given to you by your health care provider. Make sure you discuss any questions you have with your health care  provider. Document Revised: 06/12/2018 Document Reviewed: 04/24/2016 Elsevier Patient Education  2020 [employer].

## 2019-07-19 NOTE — Progress Notes (Signed)
Patient presents to clinic today for follow-up of hypertension.  Patient was seen last week on 07/12/2019 by Dr. Beverely Low in my absence complaining of elevated blood pressures despite his current regimen.  BP at that time noted to be 190/108.  Examination at that time was unremarkable.  Patient was overall asymptomatic.  Patient had endorsed taking his medications as directed but provider noted the prescription had not been written since 2019.  Losartan was increased to 100 mg daily and amlodipine 5 mg daily added.  Patient encouraged to follow DASH diet and follow-up with this provider in 1 week.  At that time labs were also checked including BMP (glucose elevated at 190), lipid panel (nonfasting.  Triglycerides greater than 1000, direct LDL at 37), CBC (WNL), LFT (WNL) and TSH (WNL).  A1c added due to elevated glucose and found to be at 6.3, placing patient in the prediabetic category.  Of note patient contacted the office last Friday to discuss that blood pressures were improving but still quite high.  Giving significant elevation in blood pressure, amlodipine was increased to 7.5 mg.  Today, patient endorses taking medications as directed.  Notes tolerating well overall but had noted a gout falre-up last week and was not sure if current medications were contributing.  Notes blood pressure at home ranging 150-160 systolic.  Notes this is a substantial improvement from 200-2 20 that he was getting previously. Patient denies chest pain, palpitations, lightheadedness, dizziness, vision changes or frequent headaches.  BP Readings from Last 3 Encounters:  07/19/19 (!) 142/98  07/12/19 (!) 199/108  06/28/19 (!) 141/102    Past Medical History:  Diagnosis Date  . Chicken pox   . Epigastric pain 04/05/2014  . Essential hypertension 05/02/2016  . Gout   . Headache disorder 2015   Tension w/medication overuse component suspected by neurologist (Dr. Everlena Cooper).  MRI brain ordered.  . Migraines     Current  Outpatient Medications on File Prior to Visit  Medication Sig Dispense Refill  . allopurinol (ZYLOPRIM) 100 MG tablet Take 1 tablet (100 mg total) by mouth daily. 30 tablet 6  . amLODipine (NORVASC) 5 MG tablet Take 1 tablet (5 mg total) by mouth daily. 30 tablet 0  . HYDROcodone-acetaminophen (NORCO/VICODIN) 5-325 MG tablet Take 1 tablet by mouth every 4 (four) hours as needed for moderate pain. 10 tablet 0  . losartan (COZAAR) 100 MG tablet Take 1 tablet (100 mg total) by mouth daily. 30 tablet 0  . pantoprazole (PROTONIX) 40 MG tablet Take 1 tablet (40 mg total) by mouth daily. 90 tablet 1   No current facility-administered medications on file prior to visit.    Allergies  Allergen Reactions  . Ciprofloxacin Itching    Family History  Problem Relation Age of Onset  . Diabetes Mother   . Hyperlipidemia Mother   . Hypertension Other        family history  . Prostate cancer Father        family history  . Heart disease Father   . Heart attack Father 28       Deceased  . Hyperlipidemia Father   . Heart disease Paternal Uncle   . Diabetes Sister        x3  . Cancer Other        Aunts & Uncles  . Cancer Other        Grandparents  . Heart attack Paternal Uncle   . Hyperlipidemia Sister   . Diabetes Brother  x1  . Hyperlipidemia Brother        x1    Social History   Socioeconomic History  . Marital status: Married    Spouse name: Not on file  . Number of children: Not on file  . Years of education: Not on file  . Highest education level: Not on file  Occupational History  . Not on file  Tobacco Use  . Smoking status: Former Smoker    Years: 3.00    Quit date: 03/04/2014    Years since quitting: 5.3  . Smokeless tobacco: Never Used  Substance and Sexual Activity  . Alcohol use: Yes    Comment: social  . Drug use: No    Comment: former user  . Sexual activity: Yes    Birth control/protection: None    Comment: married  Other Topics Concern  . 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   Grew up in [place]   Married   1 son   Social Determinants of Health   Financial Resource Strain:   . Difficulty of Paying Living Expenses:   Food Insecurity:   . Worried About Charity fundraiser in the Last Year:   . Arboriculturist in the Last Year:   Transportation Needs:   . Film/video editor (Medical):   Marland Kitchen Lack of Transportation (Non-Medical):   Physical Activity:   . Days of Exercise per Week:   . Minutes of Exercise per Session:   Stress:   . Feeling of Stress :   Social Connections:   . Frequency of Communication with Friends and Family:   . Frequency of Social Gatherings with Friends and Family:   . Attends Religious Services:   . Active Member of Clubs or Organizations:   . Attends Archivist Meetings:   Marland Kitchen Marital Status:    Review of Systems - See HPI.  All other ROS are negative.  Resp 16   Ht 5\' 7"  (1.702 m)   Wt 209 lb (94.8 kg)   BMI 32.73 kg/m   Physical Exam Vitals reviewed.  Constitutional:      Appearance: Normal appearance.  HENT:     Head: Normocephalic and atraumatic.  Eyes:     Conjunctiva/sclera: Conjunctivae normal.     Pupils: Pupils are equal, round, and reactive to light.  Cardiovascular:     Rate and Rhythm: Normal rate and regular rhythm.     Pulses: Normal pulses.     Heart sounds: Normal heart sounds.  Pulmonary:     Effort: Pulmonary effort is normal.     Breath sounds: Normal breath sounds.  Musculoskeletal:     Cervical back: Neck supple.  Neurological:     General: No focal deficit present.     Mental Status: He is alert and oriented to person, place, and time.  Psychiatric:        Mood and Affect: Mood normal.     Recent Results (from the past 2160 hour(s))  Lipid panel     Status: Abnormal   Collection Time: 07/12/19  1:38 PM  Result Value Ref Range   Cholesterol 177 0 - 200 mg/dL    Comment: ATP III  Classification       Desirable:  < 200 mg/dL               Borderline High:  200 - 239 mg/dL  High:  > = 240 mg/dL   Triglycerides (H) 0.0 - 149.0 mg/dL    2751.7 Triglyceride is over 400; calculations on Lipids are invalid.    Comment: Normal:  <150 mg/dLBorderline High:  150 - 199 mg/dL   HDL 00.17 (L) >49.44 mg/dL   Total CHOL/HDL Ratio 7     Comment:                Men          Women1/2 Average Risk     3.4          3.3Average Risk          5.0          4.42X Average Risk          9.6          7.13X Average Risk          15.0          11.0                      Basic metabolic panel     Status: Abnormal   Collection Time: 07/12/19  1:38 PM  Result Value Ref Range   Sodium 135 135 - 145 mEq/L   Potassium 3.5 3.5 - 5.1 mEq/L   Chloride 99 96 - 112 mEq/L   CO2 26 19 - 32 mEq/L   Glucose, Bld 190 (H) 70 - 99 mg/dL   BUN 6 6 - 23 mg/dL   Creatinine, Ser 9.67 0.40 - 1.50 mg/dL   GFR 59.16 >38.46 mL/min   Calcium 9.4 8.4 - 10.5 mg/dL  TSH     Status: None   Collection Time: 07/12/19  1:38 PM  Result Value Ref Range   TSH 0.82 0.35 - 4.50 uIU/mL  Hepatic function panel     Status: None   Collection Time: 07/12/19  1:38 PM  Result Value Ref Range   Total Bilirubin 0.3 0.2 - 1.2 mg/dL   Bilirubin, Direct 0.1 0.0 - 0.3 mg/dL   Alkaline Phosphatase 88 39 - 117 U/L   AST 21 0 - 37 U/L   ALT 20 0 - 53 U/L   Total Protein 7.3 6.0 - 8.3 g/dL   Albumin 4.5 3.5 - 5.2 g/dL  CBC with Differential/Platelet     Status: None   Collection Time: 07/12/19  1:38 PM  Result Value Ref Range   WBC 5.5 4.0 - 10.5 K/uL   RBC 4.69 4.22 - 5.81 Mil/uL   Hemoglobin 14.5 13.0 - 17.0 g/dL   HCT 65.9 93.5 - 70.1 %   MCV 86.6 78.0 - 100.0 fl   MCHC 35.8 30.0 - 36.0 g/dL   RDW 77.9 39.0 - 30.0 %   Platelets 222.0 150.0 - 400.0 K/uL   Neutrophils Relative % 62.8 43.0 - 77.0 %   Lymphocytes Relative 31.8 12.0 - 46.0 %   Monocytes Relative 4.2 3.0 - 12.0 %   Eosinophils Relative 0.9 0.0 - 5.0 %    Basophils Relative 0.3 0.0 - 3.0 %   Neutro Abs 3.4 1.4 - 7.7 K/uL   Lymphs Abs 1.7 0.7 - 4.0 K/uL   Monocytes Absolute 0.2 0.1 - 1.0 K/uL   Eosinophils Absolute 0.1 0.0 - 0.7 K/uL   Basophils Absolute 0.0 0.0 - 0.1 K/uL  LDL cholesterol, direct     Status: None   Collection Time: 07/12/19  1:38 PM  Result Value Ref Range   Direct  LDL 37.0 mg/dL    Comment: Optimal:  <267 mg/dLNear or Above Optimal:  100-129 mg/dLBorderline High:  130-159 mg/dLHigh:  160-189 mg/dLVery High:  >190 mg/dL  Hemoglobin T2W     Status: None   Collection Time: 07/13/19  9:42 AM  Result Value Ref Range   Hgb A1c MFr Bld 6.3 4.6 - 6.5 %    Comment: Glycemic Control Guidelines for People with Diabetes:Non Diabetic:  <6%Goal of Therapy: <7%Additional Action Suggested:  >8%     Assessment/Plan: 1. Essential hypertension BP still above goal but significantly improved from last check.  Recent adjustment and amlodipine just made this past weekend.  Would like to give current doses of medication further time to build up to the full therapeutic effect.  DASH diet reviewed in detail.  He promises to work on some of these changes.  Repeat BMP today to assess electrolytes after starting losartan.  He is to continue ambulatory blood pressure checks daily and record.  Bring to follow-up in 2 weeks.  Will complete CPE at that time. - Basic metabolic panel  2. Chronic gout of foot, unspecified cause, unspecified laterality Recent flare that has resolved.  Taking allopurinol 100 mg daily.  Discussed current medication should not be contributing to this.  Repeat uric acid level today to see if adjustment in allopurinol dosage as needed.  Dietary recommendations again reviewed. - Uric acid  3. 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  Triglycerides greater than 1000.  Patient was not fasting.  A1c in the prediabetic range.  Dietary and exercise recommendations given to patient.  Consider daily fish oil.  We will recheck  triglycerides fasting at his CPE in 2 weeks to get an accurate depiction.  We will treat according to findings.   This visit occurred during the SARS-CoV-2 public health emergency.  Safety protocols were in place, including screening questions prior to the visit, additional usage of staff PPE, and extensive cleaning of exam room while observing appropriate contact time as indicated for disinfecting solutions.     Piedad Climes, PA-C

## 2019-07-20 LAB — BASIC METABOLIC PANEL
BUN: 11 mg/dL (ref 6–23)
CO2: 31 mEq/L (ref 19–32)
Calcium: 9.4 mg/dL (ref 8.4–10.5)
Chloride: 102 mEq/L (ref 96–112)
Creatinine, Ser: 1.04 mg/dL (ref 0.40–1.50)
GFR: 92.65 mL/min (ref >60.00)
Glucose, Bld: 100 mg/dL — ABNORMAL HIGH (ref 70–99)
Potassium: 3.8 mEq/L (ref 3.5–5.1)
Sodium: 139 mEq/L (ref 135–145)

## 2019-07-20 LAB — URIC ACID: Uric Acid, Serum: 5.3 mg/dL (ref 4.0–7.8)

## 2019-08-03 ENCOUNTER — Ambulatory Visit (INDEPENDENT_AMBULATORY_CARE_PROVIDER_SITE_OTHER): Payer: 59 | Admitting: Physician Assistant

## 2019-08-03 ENCOUNTER — Encounter: Payer: Self-pay | Admitting: Physician Assistant

## 2019-08-03 ENCOUNTER — Other Ambulatory Visit: Payer: Self-pay

## 2019-08-03 VITALS — BP 138/86

## 2019-08-03 DIAGNOSIS — I10 Essential (primary) hypertension: Secondary | ICD-10-CM | POA: Diagnosis not present

## 2019-08-03 DIAGNOSIS — M7661 Achilles tendinitis, right leg: Secondary | ICD-10-CM | POA: Diagnosis not present

## 2019-08-03 DIAGNOSIS — K219 Gastro-esophageal reflux disease without esophagitis: Secondary | ICD-10-CM | POA: Diagnosis not present

## 2019-08-03 DIAGNOSIS — K648 Other hemorrhoids: Secondary | ICD-10-CM

## 2019-08-03 DIAGNOSIS — Z0001 Encounter for general adult medical examination with abnormal findings: Secondary | ICD-10-CM

## 2019-08-03 DIAGNOSIS — Z0000 Encounter for general adult medical examination without abnormal findings: Secondary | ICD-10-CM

## 2019-08-03 DIAGNOSIS — Z125 Encounter for screening for malignant neoplasm of prostate: Secondary | ICD-10-CM

## 2019-08-03 DIAGNOSIS — Z23 Encounter for immunization: Secondary | ICD-10-CM

## 2019-08-03 LAB — PSA: PSA: 0.44 ng/mL (ref 0.10–4.00)

## 2019-08-03 NOTE — Progress Notes (Signed)
Patient presents to clinic today for annual exam.  Patient is fasting for labs.  Acute Concerns: Patient endorses noting a sensation of pressure and discomfort in the rectal area over the past couple of months.  Has noted occasional bright red blood when wiping.  Denies tenesmus or melena.  Has prior history of hemorrhoid but has not had issue in some time.  Does note straining with bowel movement on occasion.  Denies abdominal pain.  Chronic Issues: Hypertension --patient currently on a regimen of losartan 100 mg daily and amlodipine 7.5 mg daily.  Endorses taking medications as directed.  Has been following a DASH diet.  Is trying to stay more active.  Notes feeling better since starting medication.  BP Readings from Last 3 Encounters:  08/03/19 138/86  07/19/19 (!) 142/98  07/12/19 (!) 199/108   GERD --patient currently on a regimen of Protonix 40 mg daily.  Endorses taking medication as directed.  Has been out of medicine.  Notes if he skips a dose he has significant breakthrough symptoms.  Is trying to work on diet.  Health Maintenance: Immunizations -- Due for TDaP. COVId vaccines up-to-date.  Colonoscopy -- had in 2016 due to GI symptoms. Negative for polyps. Recall at 52.   Past Medical History:  Diagnosis Date  . Chicken pox   . Epigastric pain 04/05/2014  . Essential hypertension 05/02/2016  . Gout   . Headache disorder 2015   Tension w/medication overuse component suspected by neurologist (Dr. Everlena Cooper).  MRI brain ordered.  . Migraines   . PUD (peptic ulcer disease) 03/14/2014    Past Surgical History:  Procedure Laterality Date  . COLONOSCOPY N/A 04/10/2014   Procedure: COLONOSCOPY;  Surgeon: Barrie Folk, MD;  Location: [facility] ENDOSCOPY;  Service: Endoscopy;  Laterality: N/A;  . ESOPHAGOGASTRODUODENOSCOPY N/A 04/10/2014   Procedure: ESOPHAGOGASTRODUODENOSCOPY (EGD);  Surgeon: Barrie Folk, MD;  Location: [facility] ENDOSCOPY;  Service: Endoscopy;  Laterality: N/A;  . HERNIA REPAIR   2006  . WISDOM TOOTH EXTRACTION      Current Outpatient Medications on File Prior to Visit  Medication Sig Dispense Refill  . allopurinol (ZYLOPRIM) 100 MG tablet Take 1 tablet (100 mg total) by mouth daily. 30 tablet 6  . amLODipine (NORVASC) 5 MG tablet Take 1 tablet (5 mg total) by mouth daily. 30 tablet 0  . losartan (COZAAR) 100 MG tablet Take 1 tablet (100 mg total) by mouth daily. 30 tablet 0  . pantoprazole (PROTONIX) 40 MG tablet Take 1 tablet (40 mg total) by mouth daily. 90 tablet 1   No current facility-administered medications on file prior to visit.    Allergies  Allergen Reactions  . Ciprofloxacin Itching    Family History  Problem Relation Age of Onset  . Diabetes Mother   . Hyperlipidemia Mother   . Hypertension Other        family history  . Prostate cancer Father        family history  . Heart disease Father   . Heart attack Father 63       Deceased  . Hyperlipidemia Father   . Heart disease Paternal Uncle   . Diabetes Sister        x3  . Cancer Other        Aunts & Uncles  . Cancer Other        Grandparents  . Heart attack Paternal Uncle   . Hyperlipidemia Sister   . Diabetes Brother  x1  . Hyperlipidemia Brother        x1    Social History   Socioeconomic History  . Marital status: Married    Spouse name: Not on file  . Number of children: Not on file  . Years of education: Not on file  . Highest education level: Not on file  Occupational History  . Not on file  Tobacco Use  . Smoking status: Former Smoker    Years: 3.00    Quit date: 03/04/2014    Years since quitting: 5.4  . Smokeless tobacco: Never Used  Substance and Sexual Activity  . Alcohol use: Yes    Comment: social  . Drug use: No    Comment: former user  . Sexual activity: Yes    Birth control/protection: None    Comment: married  Other Topics Concern  . 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   [place]   Married   1 son   Social Determinants of Health   Financial Resource Strain:   . Difficulty of Paying Living Expenses:   Food Insecurity:   . Worried About Programme researcher, broadcasting/film/videounning Out of Food in the Last Year:   . Baristaan Out of Food in the Last Year:   Transportation Needs:   . Freight forwarderLack of Transportation (Medical):   Marland Kitchen. Lack of Transportation (Non-Medical):   Physical Activity:   . Days of Exercise per Week:   . Minutes of Exercise per Session:   Stress:   . Feeling of Stress :   Social Connections:   . Frequency of Communication with Friends and Family:   . Frequency of Social Gatherings with Friends and Family:   . Attends Religious Services:   . Active Member of Clubs or Organizations:   . Attends BankerClub or Organization Meetings:   Marland Kitchen. Marital Status:   Intimate Partner Violence:   . Fear of Current or Ex-Partner:   . Emotionally Abused:   Marland Kitchen. Physically Abused:   . Sexually Abused:     Review of Systems  Constitutional: Negative for fever and weight loss.  HENT: Negative for ear discharge, ear pain, hearing loss and tinnitus.   Eyes: Negative for blurred vision, double vision, photophobia and pain.  Respiratory: Negative for cough and shortness of breath.   Cardiovascular: Negative for chest pain and palpitations.  Gastrointestinal: Positive for blood in stool and heartburn. Negative for abdominal pain, constipation, diarrhea, melena, nausea and vomiting.  Genitourinary: Negative for dysuria, flank pain, frequency, hematuria and urgency.  Musculoskeletal: Negative for falls.  Neurological: Negative for dizziness, loss of consciousness and headaches.  Endo/Heme/Allergies: Negative for environmental allergies.  Psychiatric/Behavioral: Negative for depression, hallucinations, substance abuse and suicidal ideas. The patient is not nervous/anxious and does not have insomnia.    BP 138/86 (BP Location: Right Arm, Cuff Size: Normal)   Physical Exam Vitals reviewed. Exam conducted with a  chaperone present.  Constitutional:      General: He is not in acute distress.    Appearance: He is well-developed. He is not diaphoretic.  HENT:     Head: Normocephalic and atraumatic.     Right Ear: Tympanic membrane, ear canal and external ear normal.     Left Ear: Tympanic membrane, ear canal and external ear normal.     Nose: Nose normal.     Mouth/Throat:     Pharynx: No posterior oropharyngeal erythema.  Eyes:  Conjunctiva/sclera: Conjunctivae normal.     Pupils: Pupils are equal, round, and reactive to light.  Neck:     Thyroid: No thyromegaly.  Cardiovascular:     Rate and Rhythm: Normal rate and regular rhythm.     Heart sounds: Normal heart sounds.  Pulmonary:     Effort: Pulmonary effort is normal. No respiratory distress.     Breath sounds: Normal breath sounds. No wheezing or rales.  Chest:     Chest wall: No tenderness.  Abdominal:     General: Bowel sounds are normal. There is no distension.     Palpations: Abdomen is soft. There is no mass.     Tenderness: There is no abdominal tenderness. There is no guarding or rebound.  Genitourinary:    Prostate: Normal.     Rectum: Internal hemorrhoid present. No anal fissure or external hemorrhoid. Normal anal tone.  Musculoskeletal:     Cervical back: Neck supple.  Lymphadenopathy:     Cervical: No cervical adenopathy.  Skin:    General: Skin is warm and dry.     Findings: No rash.  Neurological:     Mental Status: He is alert and oriented to person, place, and time.     Cranial Nerves: No cranial nerve deficit.     Recent Results (from the past 2160 hour(s))  Lipid panel     Status: Abnormal   Collection Time: 07/12/19  1:38 PM  Result Value Ref Range   Cholesterol 177 0 - 200 mg/dL    Comment: ATP III Classification       Desirable:  < 200 mg/dL               Borderline High:  200 - 239 mg/dL          High:  > = 818 mg/dL   Triglycerides (H) 0.0 - 149.0 mg/dL    2993.7 Triglyceride is over 400;  calculations on Lipids are invalid.    Comment: Normal:  <150 mg/dLBorderline High:  150 - 199 mg/dL   HDL 16.96 (L) >78.93 mg/dL   Total CHOL/HDL Ratio 7     Comment:                Men          Women1/2 Average Risk     3.4          3.3Average Risk          5.0          4.42X Average Risk          9.6          7.13X Average Risk          15.0          11.0                      Basic metabolic panel     Status: Abnormal   Collection Time: 07/12/19  1:38 PM  Result Value Ref Range   Sodium 135 135 - 145 mEq/L   Potassium 3.5 3.5 - 5.1 mEq/L   Chloride 99 96 - 112 mEq/L   CO2 26 19 - 32 mEq/L   Glucose, Bld 190 (H) 70 - 99 mg/dL   BUN 6 6 - 23 mg/dL   Creatinine, Ser 8.10 0.40 - 1.50 mg/dL   GFR 17.51 >02.58 mL/min   Calcium 9.4 8.4 - 10.5 mg/dL  TSH     Status: None  Collection Time: 07/12/19  1:38 PM  Result Value Ref Range   TSH 0.82 0.35 - 4.50 uIU/mL  Hepatic function panel     Status: None   Collection Time: 07/12/19  1:38 PM  Result Value Ref Range   Total Bilirubin 0.3 0.2 - 1.2 mg/dL   Bilirubin, Direct 0.1 0.0 - 0.3 mg/dL   Alkaline Phosphatase 88 39 - 117 U/L   AST 21 0 - 37 U/L   ALT 20 0 - 53 U/L   Total Protein 7.3 6.0 - 8.3 g/dL   Albumin 4.5 3.5 - 5.2 g/dL  CBC with Differential/Platelet     Status: None   Collection Time: 07/12/19  1:38 PM  Result Value Ref Range   WBC 5.5 4.0 - 10.5 K/uL   RBC 4.69 4.22 - 5.81 Mil/uL   Hemoglobin 14.5 13.0 - 17.0 g/dL   HCT 54.2 70.6 - 23.7 %   MCV 86.6 78.0 - 100.0 fl   MCHC 35.8 30.0 - 36.0 g/dL   RDW 62.8 31.5 - 17.6 %   Platelets 222.0 150.0 - 400.0 K/uL   Neutrophils Relative % 62.8 43.0 - 77.0 %   Lymphocytes Relative 31.8 12.0 - 46.0 %   Monocytes Relative 4.2 3.0 - 12.0 %   Eosinophils Relative 0.9 0.0 - 5.0 %   Basophils Relative 0.3 0.0 - 3.0 %   Neutro Abs 3.4 1.4 - 7.7 K/uL   Lymphs Abs 1.7 0.7 - 4.0 K/uL   Monocytes Absolute 0.2 0.1 - 1.0 K/uL   Eosinophils Absolute 0.1 0.0 - 0.7 K/uL   Basophils  Absolute 0.0 0.0 - 0.1 K/uL  LDL cholesterol, direct     Status: None   Collection Time: 07/12/19  1:38 PM  Result Value Ref Range   Direct LDL 37.0 mg/dL    Comment: Optimal:  <160 mg/dLNear or Above Optimal:  100-129 mg/dLBorderline High:  130-159 mg/dLHigh:  160-189 mg/dLVery High:  >190 mg/dL  Hemoglobin V3X     Status: None   Collection Time: 07/13/19  9:42 AM  Result Value Ref Range   Hgb A1c MFr Bld 6.3 4.6 - 6.5 %    Comment: Glycemic Control Guidelines for People with Diabetes:Non Diabetic:  <6%Goal of Therapy: <7%Additional Action Suggested:  >8%   Uric acid     Status: None   Collection Time: 07/19/19  3:57 PM  Result Value Ref Range   Uric Acid, Serum 5.3 4.0 - 7.8 mg/dL  Basic metabolic panel     Status: Abnormal   Collection Time: 07/19/19  3:57 PM  Result Value Ref Range   Sodium 139 135 - 145 mEq/L   Potassium 3.8 3.5 - 5.1 mEq/L   Chloride 102 96 - 112 mEq/L   CO2 31 19 - 32 mEq/L   Glucose, Bld 100 (H) 70 - 99 mg/dL   BUN 11 6 - 23 mg/dL   Creatinine, Ser 1.06 0.40 - 1.50 mg/dL   GFR 26.94 >85.46 mL/min   Calcium 9.4 8.4 - 10.5 mg/dL   Assessment/Plan: 1. Visit for preventive health examination Depression screen negative. Health Maintenance reviewed. Preventive schedule discussed and handout given in AVS. Will obtain fasting labs today.   2. Prostate cancer screening The natural history of prostate cancer and ongoing controversy regarding screening and potential treatment outcomes of prostate cancer has been discussed with the patient.  DRE reveals no nodule of prostate or palpable enlargement.  We will proceed with PSA today - PSA  3. Need for Tdap  vaccination - Tdap vaccine greater than or equal to 7yo IM  4. Essential hypertension BP much improved.  Continue current regimen.  Continue DASH diet.  Will monitor.  Recent renal function stable as were electrolytes.  5. Gastroesophageal reflux disease without esophagitis Controlled with daily PPI.   Continue current medication regimen.  Dietary and exercise recommendations reviewed with patient.  6. Internal hemorrhoid, bleeding Small.  Palpable on examination.  Supportive measures and bowel regimen reviewed.  Rx hydrocortisone suppository.  If not resolving will need assessment by gastroenterology or colorectal surgery.  7. Tendonitis, Achilles, right Mention at end of visit.  Examination completely unremarkable.  Suspect mild episodes of Achilles tendon inflammation.  Supportive measures reviewed.  Recommend heel inserts in his shoes.  Podiatry assessment if becoming more frequent or significant.  - Tdap vaccine greater than or equal to 7yo IM  This visit occurred during the SARS-CoV-2 public health emergency.  Safety protocols were in place, including screening questions prior to the visit, additional usage of staff PPE, and extensive cleaning of exam room while observing appropriate contact time as indicated for disinfecting solutions.     Leeanne Rio, PA-C

## 2019-08-03 NOTE — Patient Instructions (Signed)
Please go to the lab for blood work.   Our office will call you with your results unless you have chosen to receive results via MyChart.  If your blood work is normal we will follow-up each year for physicals and as scheduled for chronic medical problems.  If anything is abnormal we will treat accordingly and get you in for a follow-up.  Please continue current BP medication regimen.  I have sent in a refill of your Protonix. Take as directed.   Get a heel cushion insert to put in your shoe to help prevent recurrence of heel issue. If you note this to be recurrent I would want to set you up with Podiatry.  Use the hydrocortisone suppository as directed. Increase fluids. Start a daily fiber supplement.  If you note harder stool, take an OTC stool softener (Colace).  Let me know if things are not resolving.    Preventive Care 50-77 Years Old, Male Preventive care refers to lifestyle choices and visits with your health care provider that can promote health and wellness. This includes:  A yearly physical exam. This is also called an annual well check.  Regular dental and eye exams.  Immunizations.  Screening for certain conditions.  Healthy lifestyle choices, such as eating a healthy diet, getting regular exercise, not using drugs or products that contain nicotine and tobacco, and limiting alcohol use. What can I expect for my preventive care visit? Physical exam Your health care provider will check:  Height and weight. These may be used to calculate body mass index (BMI), which is a measurement that tells if you are at a healthy weight.  Heart rate and blood pressure.  Your skin for abnormal spots. Counseling Your health care provider may ask you questions about:  Alcohol, tobacco, and drug use.  Emotional well-being.  Home and relationship well-being.  Sexual activity.  Eating habits.  Work and work Statistician. What immunizations do I need?  Influenza (flu)  vaccine  This is recommended every year. Tetanus, diphtheria, and pertussis (Tdap) vaccine  You may need a Td booster every 10 years. Varicella (chickenpox) vaccine  You may need this vaccine if you have not already been vaccinated. Zoster (shingles) vaccine  You may need this after age 55. Measles, mumps, and rubella (MMR) vaccine  You may need at least one dose of MMR if you were born in 1957 or later. You may also need a second dose. Pneumococcal conjugate (PCV13) vaccine  You may need this if you have certain conditions and were not previously vaccinated. Pneumococcal polysaccharide (PPSV23) vaccine  You may need one or two doses if you smoke cigarettes or if you have certain conditions. Meningococcal conjugate (MenACWY) vaccine  You may need this if you have certain conditions. Hepatitis A vaccine  You may need this if you have certain conditions or if you travel or work in places where you may be exposed to hepatitis A. Hepatitis B vaccine  You may need this if you have certain conditions or if you travel or work in places where you may be exposed to hepatitis B. Haemophilus influenzae type b (Hib) vaccine  You may need this if you have certain risk factors. Human papillomavirus (HPV) vaccine  If recommended by your health care provider, you may need three doses over 6 months. You may receive vaccines as individual doses or as more than one vaccine together in one shot (combination vaccines). Talk with your health care provider about the risks and benefits of combination  vaccines. What tests do I need? Blood tests  Lipid and cholesterol levels. These may be checked every 5 years, or more frequently if you are over 39 years old.  Hepatitis C test.  Hepatitis B test. Screening  Lung cancer screening. You may have this screening every year starting at age 25 if you have a 30-pack-year history of smoking and currently smoke or have quit within the past 15  years.  Prostate cancer screening. Recommendations will vary depending on your family history and other risks.  Colorectal cancer screening. All adults should have this screening starting at age 38 and continuing until age 61. Your health care provider may recommend screening at age 57 if you are at increased risk. You will have tests every 1-10 years, depending on your results and the type of screening test.  Diabetes screening. This is done by checking your blood sugar (glucose) after you have not eaten for a while (fasting). You may have this done every 1-3 years.  Sexually transmitted disease (STD) testing. Follow these instructions at home: Eating and drinking  Eat a diet that includes fresh fruits and vegetables, whole grains, lean protein, and low-fat dairy products.  Take vitamin and mineral supplements as recommended by your health care provider.  Do not drink alcohol if your health care provider tells you not to drink.  If you drink alcohol: ? Limit how much you have to 0-2 drinks a day. ? Be aware of how much alcohol is in your drink. In the [place] one drink equals one 12 oz bottle of beer (355 mL), one 5 oz glass of wine (148 mL), or one 1 oz glass of hard liquor (44 mL). Lifestyle  Take daily care of your teeth and gums.  Stay active. Exercise for at least 30 minutes on 5 or more days each week.  Do not use any products that contain nicotine or tobacco, such as cigarettes, e-cigarettes, and chewing tobacco. If you need help quitting, ask your health care provider.  If you are sexually active, practice safe sex. Use a condom or other form of protection to prevent STIs (sexually transmitted infections).  Talk with your health care provider about taking a low-dose aspirin every day starting at age 73. What's next?  Go to your health care provider once a year for a well check visit.  Ask your health care provider how often you should have your eyes and teeth  checked.  Stay up to date on all vaccines. This information is not intended to replace advice given to you by your health care provider. Make sure you discuss any questions you have with your health care provider. Document Revised: 02/12/2018 Document Reviewed: 02/12/2018 Elsevier Patient Education  2020 [employer].

## 2019-08-04 ENCOUNTER — Other Ambulatory Visit: Payer: Self-pay | Admitting: Emergency Medicine

## 2019-08-04 ENCOUNTER — Telehealth: Payer: Self-pay | Admitting: Physician Assistant

## 2019-08-04 DIAGNOSIS — I10 Essential (primary) hypertension: Secondary | ICD-10-CM

## 2019-08-04 DIAGNOSIS — K219 Gastro-esophageal reflux disease without esophagitis: Secondary | ICD-10-CM

## 2019-08-04 DIAGNOSIS — K648 Other hemorrhoids: Secondary | ICD-10-CM

## 2019-08-04 MED ORDER — LOSARTAN POTASSIUM 100 MG PO TABS: 100.0000 mg | ORAL_TABLET | Freq: Every day | ORAL | 1 refills | Status: DC

## 2019-08-04 MED ORDER — AMLODIPINE BESYLATE 5 MG PO TABS: 5.0000 mg | ORAL_TABLET | Freq: Every day | ORAL | 1 refills | Status: DC

## 2019-08-04 MED ORDER — HYDROCORTISONE ACETATE 30 MG RE SUPP: 1.0000 | Freq: Two times a day (BID) | RECTAL | 0 refills | Status: DC

## 2019-08-04 MED ORDER — PANTOPRAZOLE SODIUM 40 MG PO TBEC: 40.0000 mg | DELAYED_RELEASE_TABLET | Freq: Every day | ORAL | 1 refills | Status: DC

## 2019-08-04 NOTE — Telephone Encounter (Signed)
Pt called in stating that the pantoprazole and another medication was to be sent into the [facility] on [address]. Please advise

## 2019-08-04 NOTE — Telephone Encounter (Signed)
In reviewing patient chart, medication discussed at visit was sent to the preferred pharmacy [facility].

## 2020-02-06 ENCOUNTER — Other Ambulatory Visit: Payer: Self-pay | Admitting: Physician Assistant

## 2020-02-06 DIAGNOSIS — K219 Gastro-esophageal reflux disease without esophagitis: Secondary | ICD-10-CM

## 2020-02-06 DIAGNOSIS — I10 Essential (primary) hypertension: Secondary | ICD-10-CM

## 2020-02-09 ENCOUNTER — Telehealth: Payer: Self-pay | Admitting: Physician Assistant

## 2020-02-09 DIAGNOSIS — M1A0790 Idiopathic chronic gout, unspecified ankle and foot, without tophus (tophi): Secondary | ICD-10-CM

## 2020-02-09 NOTE — Telephone Encounter (Signed)
Pt called in asking for a refill on the Allopurinol, he uses the [facility] on [address]. He also wanted to know when he needs to f/up with [facility].

## 2020-02-10 MED ORDER — ALLOPURINOL 100 MG PO TABS: 100.0000 mg | ORAL_TABLET | Freq: Every day | ORAL | 1 refills | Status: DC

## 2020-02-10 NOTE — Telephone Encounter (Signed)
Rx sent to the preferred patient pharmacy  

## 2020-02-10 NOTE — Telephone Encounter (Signed)
Advised patient he would be due for 6 month follow up for his blood pressure. He is agreeable. He will call back to schedule an appointment

## 2020-05-09 ENCOUNTER — Ambulatory Visit (INDEPENDENT_AMBULATORY_CARE_PROVIDER_SITE_OTHER): Payer: 59 | Admitting: Internal Medicine

## 2020-05-09 ENCOUNTER — Ambulatory Visit (INDEPENDENT_AMBULATORY_CARE_PROVIDER_SITE_OTHER): Payer: 59

## 2020-05-09 ENCOUNTER — Encounter: Payer: Self-pay | Admitting: Internal Medicine

## 2020-05-09 ENCOUNTER — Other Ambulatory Visit: Payer: Self-pay

## 2020-05-09 VITALS — BP 144/106 | HR 87 | Temp 98.0°F | Resp 16 | Ht 67.0 in | Wt 206.0 lb

## 2020-05-09 DIAGNOSIS — E781 Pure hyperglyceridemia: Secondary | ICD-10-CM

## 2020-05-09 DIAGNOSIS — M25562 Pain in left knee: Secondary | ICD-10-CM | POA: Diagnosis not present

## 2020-05-09 DIAGNOSIS — R7303 Prediabetes: Secondary | ICD-10-CM

## 2020-05-09 DIAGNOSIS — E785 Hyperlipidemia, unspecified: Secondary | ICD-10-CM

## 2020-05-09 DIAGNOSIS — I10 Essential (primary) hypertension: Secondary | ICD-10-CM

## 2020-05-09 DIAGNOSIS — M10062 Idiopathic gout, left knee: Secondary | ICD-10-CM

## 2020-05-09 LAB — CBC WITH DIFFERENTIAL/PLATELET
Basophils Absolute: 0 10*3/uL (ref 0.0–0.1)
Basophils Relative: 0.5 % (ref 0.0–3.0)
Eosinophils Absolute: 0 10*3/uL (ref 0.0–0.7)
Eosinophils Relative: 0.7 % (ref 0.0–5.0)
HCT: 41.9 % (ref 39.0–52.0)
Hemoglobin: 14.6 g/dL (ref 13.0–17.0)
Lymphocytes Relative: 32.5 % (ref 12.0–46.0)
Lymphs Abs: 2.1 10*3/uL (ref 0.7–4.0)
MCHC: 34.9 g/dL (ref 30.0–36.0)
MCV: 86 fl (ref 78.0–100.0)
Monocytes Absolute: 0.3 10*3/uL (ref 0.1–1.0)
Monocytes Relative: 5.1 % (ref 3.0–12.0)
Neutro Abs: 3.9 10*3/uL (ref 1.4–7.7)
Neutrophils Relative %: 61.2 % (ref 43.0–77.0)
Platelets: 219 10*3/uL (ref 150.0–400.0)
RBC: 4.87 Mil/uL (ref 4.22–5.81)
RDW: 13.4 % (ref 11.5–15.5)
WBC: 6.4 10*3/uL (ref 4.0–10.5)

## 2020-05-09 LAB — LDL CHOLESTEROL, DIRECT: Direct LDL: 39 mg/dL

## 2020-05-09 LAB — BASIC METABOLIC PANEL
BUN: 7 mg/dL (ref 6–23)
CO2: 32 mEq/L (ref 19–32)
Calcium: 9.8 mg/dL (ref 8.4–10.5)
Chloride: 101 mEq/L (ref 96–112)
Creatinine, Ser: 1.01 mg/dL (ref 0.40–1.50)
GFR: 88.42 mL/min (ref >60.00)
Glucose, Bld: 119 mg/dL — ABNORMAL HIGH (ref 70–99)
Potassium: 3.6 mEq/L (ref 3.5–5.1)
Sodium: 139 mEq/L (ref 135–145)

## 2020-05-09 LAB — URIC ACID: Uric Acid, Serum: 6.5 mg/dL (ref 4.0–7.8)

## 2020-05-09 LAB — SEDIMENTATION RATE: Sed Rate: 7 mm/hr (ref 0–15)

## 2020-05-09 LAB — HEPATIC FUNCTION PANEL
ALT: 26 U/L (ref 0–53)
AST: 22 U/L (ref 0–37)
Albumin: 4.7 g/dL (ref 3.5–5.2)
Alkaline Phosphatase: 84 U/L (ref 39–117)
Bilirubin, Direct: 0.1 mg/dL (ref 0.0–0.3)
Total Bilirubin: 0.4 mg/dL (ref 0.2–1.2)
Total Protein: 8.1 g/dL (ref 6.0–8.3)

## 2020-05-09 LAB — C-REACTIVE PROTEIN: CRP: <1 mg/dL (ref 0.5–20.0)

## 2020-05-09 LAB — LIPID PANEL
Cholesterol: 147 mg/dL (ref 0–200)
HDL: 31.9 mg/dL — ABNORMAL LOW (ref >39.00)
Total CHOL/HDL Ratio: 5
Triglycerides: 540 mg/dL — ABNORMAL HIGH (ref 0.0–149.0)

## 2020-05-09 LAB — HEMOGLOBIN A1C: Hgb A1c MFr Bld: 6.2 % (ref 4.6–6.5)

## 2020-05-09 MED ORDER — COLCHICINE 0.6 MG PO CAPS: 1.0000 | ORAL_CAPSULE | Freq: Two times a day (BID) | ORAL | 1 refills | Status: DC

## 2020-05-09 MED ORDER — OXYCODONE-ACETAMINOPHEN 7.5-325 MG PO TABS: 1.0000 | ORAL_TABLET | Freq: Four times a day (QID) | ORAL | 0 refills | Status: AC | PRN

## 2020-05-09 MED ORDER — OMEGA-3-ACID ETHYL ESTERS 1 G PO CAPS: 2.0000 g | ORAL_CAPSULE | Freq: Two times a day (BID) | ORAL | 1 refills | Status: DC

## 2020-05-09 MED ORDER — METHYLPREDNISOLONE 4 MG PO TBPK: ORAL_TABLET | ORAL | 0 refills | Status: AC

## 2020-05-09 MED ORDER — INDAPAMIDE 1.25 MG PO TABS
1.2500 mg | ORAL_TABLET | Freq: Every day | ORAL | 0 refills | Status: DC
Start: 2020-05-09 — End: 2020-08-31

## 2020-05-09 NOTE — Progress Notes (Signed)
Subjective:  Patient ID: Anthony Gill, male    DOB: 1970-03-15  Age: 50 y.o. MRN: [id number]  CC: Hypertension and Hyperlipidemia  This visit occurred during the SARS-CoV-2 public health emergency.  Safety protocols were in place, including screening questions prior to the visit, additional usage of staff PPE, and extensive cleaning of exam room while observing appropriate contact time as indicated for disinfecting solutions.   HPI Anthony Gill presents for establishing -  He complains of a 3-day history of nontraumatic left knee pain.  He has a history of gout.  He is not gotten much symptom relief with Motrin.  He has a history of hypertension.  He tells me he is not working on his lifestyle modifications but is compliant with his antihypertensives.  He is active and denies any recent episodes of chest pain, shortness of breath, palpitations, edema, or fatigue.  He has a history of high triglycerides.  He does not drink much alcohol.  He denies any recent episodes of abdominal pain.  Outpatient Medications Prior to Visit  Medication Sig Dispense Refill  . allopurinol (ZYLOPRIM) 100 MG tablet Take 1 tablet (100 mg total) by mouth daily. 90 tablet 1  . amLODipine (NORVASC) 5 MG tablet Take 1 tablet (5 mg total) by mouth daily. 90 tablet 1  . HYDROCORTISONE ACE, RECTAL, 30 MG SUPP Place 1 suppository (30 mg total) rectally in the morning and at bedtime. 28 suppository 0  . losartan (COZAAR) 100 MG tablet Take 1 tablet by mouth once daily 90 tablet 0  . pantoprazole (PROTONIX) 40 MG tablet Take 1 tablet by mouth once daily 90 tablet 0   No facility-administered medications prior to visit.    ROS Review of Systems  Constitutional: Negative for appetite change, chills, diaphoresis, fatigue and fever.  HENT: Negative.   Eyes: Negative.   Respiratory: Negative for cough, chest tightness, shortness of breath and wheezing.   Cardiovascular: Negative for chest pain, palpitations and  leg swelling.  Gastrointestinal: Negative for abdominal pain, constipation, diarrhea, nausea and vomiting.  Endocrine: Negative.   Genitourinary: Negative.  Negative for difficulty urinating.  Musculoskeletal: Positive for arthralgias. Negative for myalgias.  Skin: Negative.  Negative for color change.  Neurological: Negative.  Negative for dizziness, weakness, light-headedness, numbness and headaches.  Hematological: Negative.  Negative for adenopathy. Does not bruise/bleed easily.  Psychiatric/Behavioral: Negative.     Objective:  BP (!) 144/106   Pulse 87   Temp 98 F (36.7 C) (Oral)   Resp 16   Ht 5\' 7"  (1.702 m)   Wt 206 lb (93.4 kg)   SpO2 98%   BMI 32.26 kg/m   BP Readings from Last 3 Encounters:  05/09/20 (!) 144/106  08/03/19 138/86  07/19/19 (!) 142/98    Wt Readings from Last 3 Encounters:  05/09/20 206 lb (93.4 kg)  07/19/19 209 lb (94.8 kg)  07/12/19 209 lb (94.8 kg)    Physical Exam Vitals reviewed.  Constitutional:      Appearance: Normal appearance. He is obese.  HENT:     Nose: Nose normal.     Mouth/Throat:     Mouth: Mucous membranes are moist.  Eyes:     General: No scleral icterus.    Conjunctiva/sclera: Conjunctivae normal.  Cardiovascular:     Rate and Rhythm: Normal rate and regular rhythm.     Heart sounds: No murmur heard.   Pulmonary:     Effort: Pulmonary effort is normal.     Breath sounds:  No stridor. No wheezing, rhonchi or rales.  Abdominal:     General: Abdomen is flat.     Palpations: There is no mass.     Tenderness: There is no abdominal tenderness. There is no guarding.  Musculoskeletal:        General: Tenderness present. No swelling or deformity.     Cervical back: Neck supple.     Right knee: Normal.     Left knee: Crepitus present. No swelling. Normal range of motion. Tenderness present.     Right lower leg: No edema.     Left lower leg: No edema.     Comments: Left knee is slightly warm when compared to the  right knee  Lymphadenopathy:     Cervical: No cervical adenopathy.  Skin:    General: Skin is warm and dry.     Findings: No rash.  Neurological:     General: No focal deficit present.  Psychiatric:        Mood and Affect: Mood normal.        Behavior: Behavior normal.     Lab Results  Component Value Date   WBC 6.4 05/09/2020   HGB 14.6 05/09/2020   HCT 41.9 05/09/2020   PLT 219.0 05/09/2020   GLUCOSE 119 (H) 05/09/2020   CHOL 147 05/09/2020   TRIG (H) 05/09/2020    540.0 Triglyceride is over 400; calculations on Lipids are invalid.   HDL 31.90 (L) 05/09/2020   LDLDIRECT 39.0 05/09/2020   LDLCALC 42 06/09/2013   ALT 26 05/09/2020   AST 22 05/09/2020   NA 139 05/09/2020   K 3.6 05/09/2020   CL 101 05/09/2020   CREATININE 1.01 05/09/2020   BUN 7 05/09/2020   CO2 32 05/09/2020   TSH 0.82 07/12/2019   PSA 0.44 08/03/2019   HGBA1C 6.2 05/09/2020   DG Knee Complete 4 Views Left  Result Date: 05/09/2020 CLINICAL DATA:  Swelling.  Warmth.  Pain. EXAM: LEFT KNEE - COMPLETE 4+ VIEW COMPARISON:  No prior. FINDINGS: No acute bony or joint abnormality. No evidence of fracture or dislocation. IMPRESSION: No acute abnormality. Electronically Signed   By: Anthony Gill Fus  Gill   On: 05/09/2020 11:37    Assessment & Plan:   Anthony Gill was seen today for hypertension and hyperlipidemia.  Diagnoses and all orders for this visit:  Essential hypertension- His blood pressure is not adequately well controlled.  I will check his labs to screen for secondary causes and endorgan damage.  I recommended that he add indapamide to his current regimen. -     CBC with Differential/Platelet; Future -     Basic metabolic panel; Future -     Hepatic function panel; Future -     Aldosterone + renin activity w/ ratio; Future -     Aldosterone + renin activity w/ ratio -     Hepatic function panel -     Basic metabolic panel -     CBC with Differential/Platelet  Primary hypertriglyceridemia- His  triglycerides are above 500.  Will treat with an omega-3 fish oil supplement. -     Lipid panel; Future -     Hepatic function panel; Future -     Hepatic function panel -     Lipid panel -     omega-3 acid ethyl esters (LOVAZA) 1 g capsule; Take 2 capsules (2 g total) by mouth 2 (two) times daily.  Prediabetes- His A1c is at 6.2%.  Medical therapy is not yet indicated. -  Hepatic function panel; Future -     Hemoglobin A1c; Future -     Hemoglobin A1c -     Hepatic function panel  Acute pain of left knee- Based on his symptoms, exam, x-ray, and labs I think he is having an acute gouty flare in his left knee.  I recommended that he treat this with methylprednisolone and colchicine and will control the pain with oxycodone/apap. -     C-reactive protein; Future -     Sedimentation rate; Future -     Uric acid; Future -     Uric acid -     Sedimentation rate -     C-reactive protein -     DG Knee Complete 4 Views Left; Future -     Ambulatory referral to Sports Medicine  Acute idiopathic gout of left knee -     methylPREDNISolone (MEDROL DOSEPAK) 4 MG TBPK tablet; TAKE AS DIRECTED -     Colchicine (MITIGARE) 0.6 MG CAPS; Take 1 tablet by mouth 2 (two) times daily. -     oxyCODONE-acetaminophen (PERCOCET) 7.5-325 MG tablet; Take 1 tablet by mouth every 6 (six) hours as needed for up to 5 days.  Other orders -     LDL cholesterol, direct   I am having [name] start on methylPREDNISolone, Colchicine, oxyCODONE-acetaminophen, omega-3 acid ethyl esters, and indapamide. I am also having him maintain his amLODipine, HYDROCORTISONE ACE (RECTAL), losartan, pantoprazole, and allopurinol.  Meds ordered this encounter  Medications  . methylPREDNISolone (MEDROL DOSEPAK) 4 MG TBPK tablet    Sig: TAKE AS DIRECTED    Dispense:  21 tablet    Refill:  0  . Colchicine (MITIGARE) 0.6 MG CAPS    Sig: Take 1 tablet by mouth 2 (two) times daily.    Dispense:  60 capsule    Refill:  1   . oxyCODONE-acetaminophen (PERCOCET) 7.5-325 MG tablet    Sig: Take 1 tablet by mouth every 6 (six) hours as needed for up to 5 days.    Dispense:  20 tablet    Refill:  0  . omega-3 acid ethyl esters (LOVAZA) 1 g capsule    Sig: Take 2 capsules (2 g total) by mouth 2 (two) times daily.    Dispense:  360 capsule    Refill:  1  . indapamide (LOZOL) 1.25 MG tablet    Sig: Take 1 tablet (1.25 mg total) by mouth daily.    Dispense:  90 tablet    Refill:  0   I spent 48 minutes in preparing to see the patient by review of recent labs, imaging and procedures, obtaining and reviewing separately obtained history, communicating with the patient , ordering medications and tests, and documenting clinical information in the EHR including the differential Dx, treatment, and any further evaluation and other management of 1. Essential hypertension 2. Primary hypertriglyceridemia 3. Prediabetes 4. Acute pain of left knee 5. Acute idiopathic gout of left knee      Follow-up: Return in about 6 weeks (around 06/20/2020).  Sanda Linger, MD

## 2020-05-09 NOTE — Patient Instructions (Signed)

## 2020-05-10 NOTE — Progress Notes (Signed)
Subjective:   I, Philbert Riser, LAT, ATC acting as a scribe for Clementeen Graham, MD.  I'm seeing this patient as a consultation for Dr. Sanda Linger. Note will be routed back to referring provider/PCP.  CC: Left knee pain  HPI: Pt is a 50 y/o male presenting w/ L knee pain x approximately 5 days w/ no known MOI.  Pt has a hx of gout.  He locates his pain to the anterior aspect of L knee. Pt reports increased pain last night and today. Pt states that pain is increasing and decreasing day-to-day.  L knee swelling: yes L knee mechanical symptoms: no Aggravating factors: knee flexion, transitioning to standing Treatments tried: Motrin, methylprednisone dose pack, colchicine, oxycodone-acetaminophen  Diagnostic testing: L knee XR- 05/09/20  Past medical history, Surgical history, Family history, Social history, Allergies, and medications have been entered into the medical record, reviewed.   Review of Systems: No new headache, visual changes, nausea, vomiting, diarrhea, constipation, dizziness, abdominal pain, skin rash, fevers, chills, night sweats, weight loss, swollen lymph nodes, body aches, joint swelling, muscle aches, chest pain, shortness of breath, mood changes, visual or auditory hallucinations.   Objective:    Vitals:   05/12/20 0943  BP: (!) 150/104  Pulse: 87  SpO2: 96%   General: Well Developed, well nourished, and in no acute distress.  Neuro/Psych: Alert and oriented x3, extra-ocular muscles intact, able to move all 4 extremities, sensation grossly intact. Skin: Warm and dry, no rashes noted.  Respiratory: Not using accessory muscles, speaking in full sentences, trachea midline.  Cardiovascular: Pulses palpable, no extremity edema. Abdomen: Does not appear distended. MSK: Left knee: Mild effusion otherwise normal appearing.  ROM full TTP medial and lateral joint line.  Stable ligamentous exam.  Negative McMurray exam Intact strength   Lab and Radiology Results No  results found for this or any previous visit (from the past 72 hour(s)). DG Knee Complete 4 Views Left  Result Date: 05/09/2020 CLINICAL DATA:  Swelling.  Warmth.  Pain. EXAM: LEFT KNEE - COMPLETE 4+ VIEW COMPARISON:  No prior. FINDINGS: No acute bony or joint abnormality. No evidence of fracture or dislocation. IMPRESSION: No acute abnormality. Electronically Signed   By: Maisie Fus  Register   On: 05/09/2020 11:37  I, Clementeen Graham, personally (independently) visualized and performed the interpretation of the images attached in this note.  Procedure: Real-time Ultrasound Guided Injection of left knee superior lateral patellar space  Device: Philips Affiniti 50G Images permanently stored and available for review in PACS Verbal informed consent obtained.  Discussed risks and benefits of procedure. Warned about infection bleeding damage to structures skin hypopigmentation and fat atrophy among others. Patient expresses understanding and agreement Time-out conducted.   Noted no overlying erythema, induration, or other signs of local infection.   Skin prepped in a sterile fashion.   Local anesthesia: Topical Ethyl chloride.   With sterile technique and under real time ultrasound guidance:  40mg  kenalog and 52ml marcaine injected into knee joint. Fluid seen entering the joint.   Completed without difficulty   Pain immediately resolved suggesting accurate placement of the medication.   Advised to call if fevers/chills, erythema, induration, drainage, or persistent bleeding.   Images permanently stored and available for review in the ultrasound unit.  Impression: Technically successful ultrasound guided injection.   Lab Results  Component Value Date   LABURIC 6.5 05/09/2020       Impression and Recommendations:    Assessment and Plan: 50 y.o. male with left knee  pain in the setting of gout.    Knee pain due to gout flair vs mild DJD. Plan for steroid injection. Will also increase allopurinol to  200mg  daily as last uric acid is elevated above 6.5.   Recheck in 1 month.  Return sooner if needed.   PDMP not reviewed this encounter. Orders Placed This Encounter  Procedures  . LIMITED JOINT SPACE STRUCTURES LOW LEFT(NO LINKED CHARGES)    Standing Status:   Future    Number of Occurrences:   1    Standing Expiration Date:   11/12/2020    Order Specific Question:   Reason for Exam (SYMPTOM  OR DIAGNOSIS REQUIRED)    Answer:   left knee pain    Order Specific Question:   Preferred imaging location?    Answer:   [facility] ordered this encounter  Medications  . allopurinol (ZYLOPRIM) 100 MG tablet    Sig: Take 2 tablets (200 mg total) by mouth daily.    Dispense:  180 tablet    Refill:  1    Discussed warning signs or symptoms. Please see discharge instructions. Patient expresses understanding.   The above documentation has been reviewed and is accurate and complete [facility], M.D.

## 2020-05-12 ENCOUNTER — Ambulatory Visit (INDEPENDENT_AMBULATORY_CARE_PROVIDER_SITE_OTHER): Payer: 59 | Admitting: Family Medicine

## 2020-05-12 ENCOUNTER — Other Ambulatory Visit: Payer: Self-pay

## 2020-05-12 ENCOUNTER — Ambulatory Visit: Payer: Self-pay

## 2020-05-12 ENCOUNTER — Encounter: Payer: Self-pay | Admitting: Internal Medicine

## 2020-05-12 ENCOUNTER — Encounter: Payer: Self-pay | Admitting: Family Medicine

## 2020-05-12 VITALS — BP 150/104 | HR 87 | Ht 67.0 in | Wt 212.2 lb

## 2020-05-12 DIAGNOSIS — M1A0790 Idiopathic chronic gout, unspecified ankle and foot, without tophus (tophi): Secondary | ICD-10-CM

## 2020-05-12 DIAGNOSIS — M25562 Pain in left knee: Secondary | ICD-10-CM

## 2020-05-12 MED ORDER — ALLOPURINOL 100 MG PO TABS
200.0000 mg | ORAL_TABLET | Freq: Every day | ORAL | 1 refills | Status: DC
Start: 2020-05-12 — End: 2020-08-31

## 2020-05-12 NOTE — Patient Instructions (Addendum)
Thank you for coming in today.  You received an injection today. Seek immediate medical attention if the joint becomes red, extremely painful, or is oozing fluid.  Start taking the allopurinol, 2 pills a day.  Recheck back in 1 month.

## 2020-05-16 LAB — ALDOSTERONE + RENIN ACTIVITY W/ RATIO
ALDO / PRA Ratio: 3.7 Ratio (ref 0.9–28.9)
Aldosterone: 3 ng/dL
Renin Activity: 0.81 ng/mL/h (ref 0.25–5.82)

## 2020-06-09 NOTE — Progress Notes (Deleted)
   I, Philbert Riser, LAT, ATC acting as a scribe for Clementeen Graham, MD.  Anthony Gill is a 50 y.o. male who presents to [facility] today for L knee pain ongoing since early March due to a gout flare. Pt was last seen by Dr. Denyse Amass on 05/12/20 and was given a L knee steroid injection and advised to increase allopurinol to 200mg  daily. Today, pt reports  Dx testing: 05/09/20 L knee XR & labs  Pertinent review of systems: ***  Relevant historical information: ***   Exam:  There were no vitals taken for this visit. General: Well Developed, well nourished, and in no acute distress.   MSK: ***    Lab and Radiology Results No results found for this or any previous visit (from the past 72 hour(s)). No results found.     Assessment and Plan: 50 y.o. male with ***   PDMP not reviewed this encounter. No orders of the defined types were placed in this encounter.  No orders of the defined types were placed in this encounter.    Discussed warning signs or symptoms. Please see discharge instructions. Patient expresses understanding.   ***

## 2020-06-12 ENCOUNTER — Ambulatory Visit: Payer: 59 | Admitting: Family Medicine

## 2020-06-20 ENCOUNTER — Ambulatory Visit: Payer: 59 | Admitting: Internal Medicine

## 2020-07-06 ENCOUNTER — Ambulatory Visit: Payer: 59 | Admitting: Internal Medicine

## 2020-08-08 ENCOUNTER — Ambulatory Visit: Payer: 59 | Admitting: Internal Medicine

## 2020-08-31 ENCOUNTER — Ambulatory Visit (INDEPENDENT_AMBULATORY_CARE_PROVIDER_SITE_OTHER): Payer: 59 | Admitting: Internal Medicine

## 2020-08-31 ENCOUNTER — Encounter: Payer: Self-pay | Admitting: Internal Medicine

## 2020-08-31 ENCOUNTER — Other Ambulatory Visit: Payer: Self-pay

## 2020-08-31 VITALS — BP 146/98 | HR 76 | Temp 98.6°F | Ht 67.0 in | Wt 205.0 lb

## 2020-08-31 DIAGNOSIS — Z125 Encounter for screening for malignant neoplasm of prostate: Secondary | ICD-10-CM | POA: Diagnosis not present

## 2020-08-31 DIAGNOSIS — K219 Gastro-esophageal reflux disease without esophagitis: Secondary | ICD-10-CM

## 2020-08-31 DIAGNOSIS — E781 Pure hyperglyceridemia: Secondary | ICD-10-CM

## 2020-08-31 DIAGNOSIS — R0600 Dyspnea, unspecified: Secondary | ICD-10-CM

## 2020-08-31 DIAGNOSIS — M10062 Idiopathic gout, left knee: Secondary | ICD-10-CM

## 2020-08-31 DIAGNOSIS — M1A0790 Idiopathic chronic gout, unspecified ankle and foot, without tophus (tophi): Secondary | ICD-10-CM | POA: Diagnosis not present

## 2020-08-31 DIAGNOSIS — E876 Hypokalemia: Secondary | ICD-10-CM | POA: Insufficient documentation

## 2020-08-31 DIAGNOSIS — Z1159 Encounter for screening for other viral diseases: Secondary | ICD-10-CM | POA: Insufficient documentation

## 2020-08-31 DIAGNOSIS — R7303 Prediabetes: Secondary | ICD-10-CM

## 2020-08-31 DIAGNOSIS — I10 Essential (primary) hypertension: Secondary | ICD-10-CM | POA: Diagnosis not present

## 2020-08-31 DIAGNOSIS — R0609 Other forms of dyspnea: Secondary | ICD-10-CM | POA: Insufficient documentation

## 2020-08-31 DIAGNOSIS — Z0001 Encounter for general adult medical examination with abnormal findings: Secondary | ICD-10-CM | POA: Insufficient documentation

## 2020-08-31 DIAGNOSIS — Z8249 Family history of ischemic heart disease and other diseases of the circulatory system: Secondary | ICD-10-CM

## 2020-08-31 DIAGNOSIS — T502X5A Adverse effect of carbonic-anhydrase inhibitors, benzothiadiazides and other diuretics, initial encounter: Secondary | ICD-10-CM | POA: Insufficient documentation

## 2020-08-31 LAB — BASIC METABOLIC PANEL
BUN: 7 mg/dL (ref 6–23)
CO2: 28 mEq/L (ref 19–32)
Calcium: 9 mg/dL (ref 8.4–10.5)
Chloride: 101 mEq/L (ref 96–112)
Creatinine, Ser: 1.01 mg/dL (ref 0.40–1.50)
GFR: 88.23 mL/min (ref >60.00)
Glucose, Bld: 148 mg/dL — ABNORMAL HIGH (ref 70–99)
Potassium: 3.4 mEq/L — ABNORMAL LOW (ref 3.5–5.1)
Sodium: 139 mEq/L (ref 135–145)

## 2020-08-31 LAB — TRIGLYCERIDES: Triglycerides: 838 mg/dL — ABNORMAL HIGH (ref 0.0–149.0)

## 2020-08-31 LAB — HEMOGLOBIN A1C: Hgb A1c MFr Bld: 6 % (ref 4.6–6.5)

## 2020-08-31 LAB — URIC ACID: Uric Acid, Serum: 7.7 mg/dL (ref 4.0–7.8)

## 2020-08-31 LAB — TROPONIN I (HIGH SENSITIVITY): High Sens Troponin I: 4 ng/L (ref 2–17)

## 2020-08-31 LAB — PSA: PSA: 0.74 ng/mL (ref 0.10–4.00)

## 2020-08-31 LAB — BRAIN NATRIURETIC PEPTIDE: Pro B Natriuretic peptide (BNP): 14 pg/mL (ref 0.0–100.0)

## 2020-08-31 MED ORDER — FENOFIBRATE 145 MG PO TABS: 145.0000 mg | ORAL_TABLET | Freq: Every day | ORAL | 1 refills | Status: DC

## 2020-08-31 MED ORDER — OMEGA-3-ACID ETHYL ESTERS 1 G PO CAPS: 2.0000 g | ORAL_CAPSULE | Freq: Two times a day (BID) | ORAL | 1 refills | Status: DC

## 2020-08-31 MED ORDER — INDAPAMIDE 1.25 MG PO TABS: 1.2500 mg | ORAL_TABLET | Freq: Every day | ORAL | 0 refills | Status: DC

## 2020-08-31 MED ORDER — ALLOPURINOL 100 MG PO TABS: 200.0000 mg | ORAL_TABLET | Freq: Every day | ORAL | 1 refills | Status: DC

## 2020-08-31 MED ORDER — POTASSIUM CHLORIDE CRYS ER 20 MEQ PO TBCR: 20.0000 meq | EXTENDED_RELEASE_TABLET | Freq: Two times a day (BID) | ORAL | 1 refills | Status: DC

## 2020-08-31 MED ORDER — AMLODIPINE BESYLATE 5 MG PO TABS
5.0000 mg | ORAL_TABLET | Freq: Every day | ORAL | 1 refills | Status: DC
Start: 2020-08-31 — End: 2021-04-28

## 2020-08-31 NOTE — Progress Notes (Signed)
Subjective:  Patient ID: Anthony Gill, male    DOB: 1970-05-05  Age: 50 y.o. MRN: [id number]  CC: Annual Exam, Hypertension, and Hyperlipidemia  This visit occurred during the SARS-CoV-2 public health emergency.  Safety protocols were in place, including screening questions prior to the visit, additional usage of staff PPE, and extensive cleaning of exam room while observing appropriate contact time as indicated for disinfecting solutions.    HPI Anthony Gill presents for a CPX and f/up -   He has not been monitoring his blood pressure.  He is active and denies any recent episodes of headache, blurred vision, chest pain, palpitations, edema, fatigue, or abdominal pain. He complains of 6 month hx of DOE.  Outpatient Medications Prior to Visit  Medication Sig Dispense Refill   pantoprazole (PROTONIX) 40 MG tablet Take 1 tablet by mouth once daily 90 tablet 0   allopurinol (ZYLOPRIM) 100 MG tablet Take 2 tablets (200 mg total) by mouth daily. 180 tablet 1   amLODipine (NORVASC) 5 MG tablet Take 1 tablet (5 mg total) by mouth daily. 90 tablet 1   Colchicine (MITIGARE) 0.6 MG CAPS Take 1 tablet by mouth 2 (two) times daily. 60 capsule 1   HYDROCORTISONE ACE, RECTAL, 30 MG SUPP Place 1 suppository (30 mg total) rectally in the morning and at bedtime. 28 suppository 0   indapamide (LOZOL) 1.25 MG tablet Take 1 tablet (1.25 mg total) by mouth daily. 90 tablet 0   losartan (COZAAR) 100 MG tablet Take 1 tablet by mouth once daily 90 tablet 0   omega-3 acid ethyl esters (LOVAZA) 1 g capsule Take 2 capsules (2 g total) by mouth 2 (two) times daily. 360 capsule 1   No facility-administered medications prior to visit.    ROS Review of Systems  Constitutional:  Negative for appetite change, diaphoresis, fatigue and unexpected weight change.  HENT: Negative.    Eyes: Negative.   Respiratory:  Negative for cough, chest tightness, shortness of breath and wheezing.   Cardiovascular:  Negative  for chest pain, palpitations and leg swelling.  Gastrointestinal:  Negative for abdominal pain, constipation, diarrhea, nausea and vomiting.  Endocrine: Negative.   Genitourinary: Negative.  Negative for difficulty urinating, scrotal swelling and testicular pain.  Musculoskeletal:  Negative for arthralgias and myalgias.  Skin: Negative.  Negative for color change, pallor and rash.  Allergic/Immunologic: Negative.   Neurological: Negative.  Negative for dizziness, weakness, light-headedness and headaches.  Hematological: Negative.  Negative for adenopathy. Does not bruise/bleed easily.  Psychiatric/Behavioral: Negative.  Negative for dysphoric mood. The patient is not nervous/anxious.    Objective:  BP (!) 146/98 (BP Location: Right Arm, Patient Position: Sitting, Cuff Size: Large)   Pulse 76   Temp 98.6 F (37 C) (Oral)   Ht 5\' 7"  (1.702 m)   Wt 205 lb (93 kg)   SpO2 97%   BMI 32.11 kg/m   BP Readings from Last 3 Encounters:  08/31/20 (!) 146/98  05/12/20 (!) 150/104  05/09/20 (!) 144/106    Wt Readings from Last 3 Encounters:  08/31/20 205 lb (93 kg)  05/12/20 212 lb 3.2 oz (96.3 kg)  05/09/20 206 lb (93.4 kg)    Physical Exam Vitals reviewed.  Constitutional:      Appearance: Normal appearance.  HENT:     Nose: Nose normal.     Mouth/Throat:     Mouth: Mucous membranes are moist.  Eyes:     General: No scleral icterus.    Conjunctiva/sclera:  Conjunctivae normal.  Cardiovascular:     Rate and Rhythm: Normal rate and regular rhythm.     Heart sounds: No murmur heard.    Comments: EKG- NSR, 78 bpm No LVH Normal EKG Pulmonary:     Effort: Pulmonary effort is normal.     Breath sounds: No stridor. No wheezing, rhonchi or rales.  Abdominal:     General: Abdomen is flat. Bowel sounds are normal. There is no distension.     Palpations: Abdomen is soft. There is no hepatomegaly, splenomegaly or mass.     Tenderness: There is no abdominal tenderness. There is no  guarding.     Hernia: No hernia is present. There is no hernia in the left inguinal area or right inguinal area.  Genitourinary:    Pubic Area: No rash.      Penis: Normal.      Testes: Normal.     Epididymis:     Right: Normal.     Left: Normal.     Prostate: Normal. Not enlarged, not tender and no nodules present.     Rectum: Normal. Guaiac result negative. No mass, tenderness, anal fissure, external hemorrhoid or internal hemorrhoid. Normal anal tone.  Musculoskeletal:        General: Normal range of motion.     Cervical back: Neck supple.     Right lower leg: No edema.     Left lower leg: No edema.  Lymphadenopathy:     Cervical: No cervical adenopathy.     Lower Body: No right inguinal adenopathy. No left inguinal adenopathy.  Skin:    General: Skin is warm and dry.  Neurological:     General: No focal deficit present.     Mental Status: He is alert.  Psychiatric:        Mood and Affect: Mood normal.        Behavior: Behavior normal.    Lab Results  Component Value Date   WBC 6.4 05/09/2020   HGB 14.6 05/09/2020   HCT 41.9 05/09/2020   PLT 219.0 05/09/2020   GLUCOSE 148 (H) 08/31/2020   CHOL 147 05/09/2020   TRIG 838.0 (H) 08/31/2020   HDL 31.90 (L) 05/09/2020   LDLDIRECT 39.0 05/09/2020   LDLCALC 42 06/09/2013   ALT 26 05/09/2020   AST 22 05/09/2020   NA 139 08/31/2020   K 3.4 (L) 08/31/2020   CL 101 08/31/2020   CREATININE 1.01 08/31/2020   BUN 7 08/31/2020   CO2 28 08/31/2020   TSH 0.82 07/12/2019   PSA 0.74 08/31/2020   HGBA1C 6.0 08/31/2020    No results found.  Assessment & Plan:   Bensen was seen today for annual exam, hypertension and hyperlipidemia.  Diagnoses and all orders for this visit:  Prediabetes- His A1c is at 6.0%.  Medical therapy is not indicated. -     Basic metabolic panel; Future -     Hemoglobin A1c; Future -     Hemoglobin A1c -     Basic metabolic panel -     Amb Referral to Nutrition and Diabetic Education  Chronic  gout of foot, unspecified cause, unspecified laterality- His uric acid level remains elevated.  Will increase the dose of allopurinol. -     Uric acid; Future -     Uric acid -     allopurinol (ZYLOPRIM) 100 MG tablet; Take 2 tablets (200 mg total) by mouth daily.  Essential hypertension- His blood pressure is not adequately well controlled.  Will  restart the antihypertensives and will treat the hypokalemia. -     Basic metabolic panel; Future -     EKG 12-Lead -     amLODipine (NORVASC) 5 MG tablet; Take 1 tablet (5 mg total) by mouth daily. -     indapamide (LOZOL) 1.25 MG tablet; Take 1 tablet (1.25 mg total) by mouth daily. -     Basic metabolic panel -     potassium chloride SA (KLOR-CON) 20 MEQ tablet; Take 1 tablet (20 mEq total) by mouth 2 (two) times daily.  Acute idiopathic gout of left knee  Primary hypertriglyceridemia- His triglycerides remain elevated.  I have asked him to be more compliant with the omega-3 fish oils and will start fenofibrate. -     Triglycerides; Future -     Triglycerides -     omega-3 acid ethyl esters (LOVAZA) 1 g capsule; Take 2 capsules (2 g total) by mouth 2 (two) times daily. -     Amb Referral to Nutrition and Diabetic Education -     fenofibrate (TRICOR) 145 MG tablet; Take 1 tablet (145 mg total) by mouth daily.  Gastroesophageal reflux disease without esophagitis- Sx's are well controlled.  Encounter for general adult medical examination with abnormal findings- Exam completed, labs reviewed, vaccines reviewed, cancer screenings are up-to-date, patient education was given. -     PSA; Future -     Hepatitis C antibody; Future -     HIV Antibody (routine testing w rflx); Future -     HIV Antibody (routine testing w rflx) -     Hepatitis C antibody -     PSA  Need for hepatitis C screening test -     Hepatitis C antibody; Future -     Hepatitis C antibody  DOE (dyspnea on exertion)- His EKG and labs are reassuring.  I recommended that he  undergo a CT cardiac score to screen for atherosclerosis. -     Brain natriuretic peptide; Future -     Troponin I (High Sensitivity); Future -     EKG 12-Lead -     CT CARDIAC SCORING (SELF PAY ONLY); Future -     Troponin I (High Sensitivity) -     Brain natriuretic peptide  Family history of early CAD -     CT CARDIAC SCORING (SELF PAY ONLY); Future  Diuretic-induced hypokalemia -     potassium chloride SA (KLOR-CON) 20 MEQ tablet; Take 1 tablet (20 mEq total) by mouth 2 (two) times daily.  I have discontinued Nicki [place]. Anthony Gill's HYDROCORTISONE ACE (RECTAL), losartan, and Colchicine. I am also having him start on fenofibrate and potassium chloride SA. Additionally, I am having him maintain his pantoprazole, amLODipine, indapamide, allopurinol, and omega-3 acid ethyl esters.  Meds ordered this encounter  Medications   amLODipine (NORVASC) 5 MG tablet    Sig: Take 1 tablet (5 mg total) by mouth daily.    Dispense:  90 tablet    Refill:  1   indapamide (LOZOL) 1.25 MG tablet    Sig: Take 1 tablet (1.25 mg total) by mouth daily.    Dispense:  90 tablet    Refill:  0   allopurinol (ZYLOPRIM) 100 MG tablet    Sig: Take 2 tablets (200 mg total) by mouth daily.    Dispense:  180 tablet    Refill:  1   omega-3 acid ethyl esters (LOVAZA) 1 g capsule    Sig: Take 2 capsules (2 g total) by  mouth 2 (two) times daily.    Dispense:  360 capsule    Refill:  1   fenofibrate (TRICOR) 145 MG tablet    Sig: Take 1 tablet (145 mg total) by mouth daily.    Dispense:  90 tablet    Refill:  1   potassium chloride SA (KLOR-CON) 20 MEQ tablet    Sig: Take 1 tablet (20 mEq total) by mouth 2 (two) times daily.    Dispense:  180 tablet    Refill:  1      Follow-up: Return in about 3 months (around 12/01/2020).  Sanda Linger, MD

## 2020-08-31 NOTE — Patient Instructions (Signed)

## 2020-09-01 LAB — HEPATITIS C ANTIBODY
Hepatitis C Ab: NONREACTIVE
SIGNAL TO CUT-OFF: 0.02 (ref <1.00)

## 2020-09-01 LAB — HIV ANTIBODY (ROUTINE TESTING W REFLEX): HIV 1&2 Ab, 4th Generation: NONREACTIVE

## 2020-09-04 ENCOUNTER — Encounter: Payer: Self-pay | Admitting: Internal Medicine

## 2020-09-05 ENCOUNTER — Telehealth: Payer: Self-pay | Admitting: Internal Medicine

## 2020-09-05 NOTE — Telephone Encounter (Signed)
Pt had questions about why need for medications that he was not previously taken.  Reviewed medications & labs with pt.  Pt appreciative & verb understanding.  States he will start taking medication & plans to see nutritionist end of August.

## 2020-09-05 NOTE — Telephone Encounter (Signed)
[facility] FYI 7.2.22:  ---Caller states he was prescribed several prescriptions and is unsure of what they are. They are all for blood pressure, 2 are his normal medications and 3 are new. Caller states that he wants to talk to the provider in regards to his medications. Caller states that he is asymptomatic at this time.  Advised to see PCP within 24 hours - patient would rather the nurse give him a call to discuss the medications. He just wasn't aware of having any new medications

## 2020-09-15 ENCOUNTER — Encounter: Payer: Self-pay | Admitting: Internal Medicine

## 2020-10-17 ENCOUNTER — Encounter: Payer: 59 | Attending: Internal Medicine | Admitting: Registered"

## 2020-10-19 ENCOUNTER — Other Ambulatory Visit: Payer: Self-pay

## 2020-10-19 ENCOUNTER — Telehealth: Payer: Self-pay

## 2020-10-19 DIAGNOSIS — K219 Gastro-esophageal reflux disease without esophagitis: Secondary | ICD-10-CM

## 2020-10-19 MED ORDER — PANTOPRAZOLE SODIUM 40 MG PO TBEC: 40.0000 mg | DELAYED_RELEASE_TABLET | Freq: Every day | ORAL | 0 refills | Status: DC

## 2020-10-19 NOTE — Telephone Encounter (Signed)
pt has stated he needs a rx refill for pantoprazole (PROTONIX) 40 MG tablet.

## 2020-12-06 ENCOUNTER — Ambulatory Visit: Payer: 59 | Admitting: Internal Medicine

## 2020-12-20 ENCOUNTER — Telehealth: Payer: Self-pay | Admitting: Internal Medicine

## 2020-12-20 ENCOUNTER — Telehealth: Payer: Self-pay

## 2020-12-20 NOTE — Telephone Encounter (Signed)
Noted  

## 2020-12-20 NOTE — Telephone Encounter (Signed)
[facility]   Caller states he is having swelling to his left elbow. He went to [facility] on Sunday and was told it was probably gout or something else (does not remember). Swelling has not gone down since Sunday. Currently taking Allopurinol. Was also prescribed an antibiotic but he's allergic to it and he's not taking it.  Advised to be seen within 4 hrs.Caller refused to go to UC/ED tonight. States he will just call his PCP tomorrow morning and get an appointment.

## 2020-12-20 NOTE — Telephone Encounter (Signed)
Pt has been scheduled for UC follow up for gout/arm swelling for tomorrow with Dr. Posey Rea on 10/20 @ 3pm

## 2020-12-21 ENCOUNTER — Other Ambulatory Visit: Payer: Self-pay

## 2020-12-21 ENCOUNTER — Encounter: Payer: Self-pay | Admitting: Internal Medicine

## 2020-12-21 ENCOUNTER — Ambulatory Visit (INDEPENDENT_AMBULATORY_CARE_PROVIDER_SITE_OTHER): Payer: 59 | Admitting: Internal Medicine

## 2020-12-21 DIAGNOSIS — M109 Gout, unspecified: Secondary | ICD-10-CM | POA: Diagnosis not present

## 2020-12-21 DIAGNOSIS — I10 Essential (primary) hypertension: Secondary | ICD-10-CM

## 2020-12-21 DIAGNOSIS — M7030 Other bursitis of elbow, unspecified elbow: Secondary | ICD-10-CM | POA: Insufficient documentation

## 2020-12-21 DIAGNOSIS — M7022 Olecranon bursitis, left elbow: Secondary | ICD-10-CM

## 2020-12-21 MED ORDER — KETOROLAC TROMETHAMINE 60 MG/2ML IM SOLN
60.0000 mg | Freq: Once | INTRAMUSCULAR | Status: AC
  Administered 2020-12-21: 60 mg via INTRAMUSCULAR

## 2020-12-21 MED ORDER — HYDROCODONE-ACETAMINOPHEN 7.5-325 MG PO TABS: 1.0000 | ORAL_TABLET | Freq: Four times a day (QID) | ORAL | 0 refills | Status: DC | PRN

## 2020-12-21 MED ORDER — PREDNISONE 10 MG PO TABS: ORAL_TABLET | ORAL | 0 refills | Status: DC

## 2020-12-21 MED ORDER — LOSARTAN POTASSIUM 100 MG PO TABS: 100.0000 mg | ORAL_TABLET | Freq: Every day | ORAL | 3 refills | Status: DC

## 2020-12-21 MED ORDER — DOXYCYCLINE HYCLATE 100 MG PO TABS: 100.0000 mg | ORAL_TABLET | Freq: Two times a day (BID) | ORAL | 0 refills | Status: DC

## 2020-12-21 NOTE — Assessment & Plan Note (Addendum)
L elbow bursitis - likely gout Pt refused olecranon bursa diagnostic tap/injection.  I do not think it is infectious, however unable to be sure 100%.  Given doxycycline empirically Off work since [place] this wk. Back to work - Mon if ok Go to ER if not worse  Toradol 60 mg IM Norco po prn Deltasone taper Empiric Doxy Voltaren gel

## 2020-12-21 NOTE — Assessment & Plan Note (Signed)
Pt is taking Losartan, Amlodipine, Lozol. D/c Lozol due to cramps and ?gout

## 2020-12-21 NOTE — Assessment & Plan Note (Signed)
Worse On Allopurinol D/c Lozol - side effects Prednisone taper

## 2020-12-21 NOTE — Progress Notes (Signed)
Subjective:  Patient ID: Anthony Gill, male    DOB: 14-May-1970  Age: 50 y.o. MRN: [id number]  CC: Joint Swelling ((L) ELBOW IS SWOLLEN- pt states he went to urgent care on Monday. Labs stated uric acid was high- ? Gout flare)   HPI Anthony Gill presents for severe pain in the L elbow since last week PT has a h/o gout He went to [facility] on Sun - he had labs and X ray. He was dx'd w/gout, given prednisone 20 mg bid - (colchicine allergic). 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 180 tablet 1   amLODipine (NORVASC) 5 MG tablet Take 1 tablet (5 mg total) by mouth daily. 90 tablet 1   fenofibrate (TRICOR) 145 MG tablet Take 1 tablet (145 mg total) by mouth daily. 90 tablet 1   omega-3 acid ethyl esters (LOVAZA) 1 g capsule Take 2 capsules (2 g total) by mouth 2 (two) times daily. 360 capsule 1   pantoprazole (PROTONIX) 40 MG tablet Take 1 tablet (40 mg total) by mouth daily. 90 tablet 0   potassium chloride SA (KLOR-CON) 20 MEQ tablet Take 1 tablet (20 mEq total) by mouth 2 (two) times daily. 180 tablet 1   indapamide (LOZOL) 1.25 MG tablet Take 1 tablet (1.25 mg total) by mouth daily. 90 tablet 0   No facility-administered medications prior to visit.    ROS: Review of Systems  Constitutional:  Negative for appetite change, chills, fatigue, fever and unexpected weight change.  HENT:  Negative for congestion, nosebleeds, sneezing, sore throat and trouble swallowing.   Eyes:  Negative for itching and visual disturbance.  Respiratory:  Negative for cough.   Cardiovascular:  Negative for chest pain, palpitations and leg swelling.  Gastrointestinal:  Negative for abdominal distention, blood in stool, diarrhea and nausea.  Genitourinary:  Negative for frequency and hematuria.   Musculoskeletal:  Positive for arthralgias. Negative for back pain, gait problem, joint swelling and neck pain.  Skin:  Negative for rash.  Neurological:  Negative for dizziness, tremors, speech difficulty and weakness.  Psychiatric/Behavioral:  Negative for agitation, dysphoric mood and sleep disturbance. The patient is not nervous/anxious.    Objective:  There were no vitals taken for this visit.  BP Readings from Last 3 Encounters:  08/31/20 (!) 146/98  05/12/20 (!) 150/104  05/09/20 (!) 144/106    Wt Readings from Last 3 Encounters:  08/31/20 205 lb (93 kg)  05/12/20 212 lb 3.2 oz (96.3 kg)  05/09/20 206 lb (93.4 kg)    Physical Exam Constitutional:      General: He is not in acute distress.    Appearance: He is well-developed. He is not toxic-appearing.     Comments: NAD  Eyes:     Conjunctiva/sclera: Conjunctivae normal.     Pupils: Pupils are equal, round, and reactive to light.  Neck:     Thyroid: No thyromegaly.     Vascular: No JVD.  Cardiovascular:     Rate and Rhythm: Normal rate and regular rhythm.     Heart sounds: Normal heart sounds. No murmur heard.   No friction rub. No gallop.  Pulmonary:     Effort: Pulmonary effort is normal. No respiratory distress.     Breath sounds: Normal  breath sounds. No wheezing or rales.  Chest:     Chest wall: No tenderness.  Abdominal:     General: Bowel sounds are normal. There is no distension.     Palpations: Abdomen is soft. There is no mass.     Tenderness: There is no abdominal tenderness. There is no guarding or rebound.  Musculoskeletal:        General: Swelling and tenderness present. Normal range of motion.     Cervical back: Normal range of motion.  Lymphadenopathy:     Cervical: No cervical adenopathy.  Skin:    General: Skin is warm and dry.     Findings: Erythema present. No rash.  Neurological:     Mental Status: He is alert and oriented to person, place, and time.     Cranial Nerves: No cranial  nerve deficit.     Motor: No abnormal muscle tone.     Coordination: Coordination normal.     Gait: Gait normal.     Deep Tendon Reflexes: Reflexes are normal and symmetric.  Psychiatric:        Behavior: Behavior normal.        Thought Content: Thought content normal.        Judgment: Judgment normal.   Left elbow with swollen olecranon bursa.  It is very tender to touch, erythematous and hot.  Range of motion is decreased due to pain    A total time of 46 minutes was spent preparing to see the patient, reviewing tests, x-rays, operative reports and outside records.  Also, obtaining history and performing comprehensive physical exam.  Additionally, counseling the patient regarding the above listed issues  -gout, hypertension, gout triggers.  Finally, documenting clinical information in the health records, coordination of care, educating the patient regarding  Lab Results  Component Value Date   WBC 6.4 05/09/2020   HGB 14.6 05/09/2020   HCT 41.9 05/09/2020   PLT 219.0 05/09/2020   GLUCOSE 148 (H) 08/31/2020   CHOL 147 05/09/2020   TRIG 838.0 (H) 08/31/2020   HDL 31.90 (L) 05/09/2020   LDLDIRECT 39.0 05/09/2020   LDLCALC 42 06/09/2013   ALT 26 05/09/2020   AST 22 05/09/2020   NA 139 08/31/2020   K 3.4 (L) 08/31/2020   CL 101 08/31/2020   CREATININE 1.01 08/31/2020   BUN 7 08/31/2020   CO2 28 08/31/2020   TSH 0.82 07/12/2019   PSA 0.74 08/31/2020   HGBA1C 6.0 08/31/2020    No results found.  Assessment & Plan:   Problem List Items Addressed This Visit     Bursitis of elbow     L elbow bursitis - likely gout Pt refused olecranon bursa diagnostic tap/injection.  I do not think it is infectious, however unable to be sure 100%.  Given doxycycline empirically Off work since Anthony Gill this wk. Back to work - Mon if ok Go to ER if not worse  Toradol 60 mg IM Norco po prn Deltasone taper Empiric Doxy Voltaren gel      Essential hypertension    Pt is taking Losartan,  Amlodipine, Lozol. D/c Lozol due to cramps and ?gout      Relevant Medications   losartan (COZAAR) 100 MG tablet   Gout    Worse On Allopurinol D/c Lozol - side effects Prednisone taper         Meds ordered this encounter  Medications   DISCONTD: predniSONE (DELTASONE) 10 MG tablet    Sig: Prednisone 10 mg: take 6 tabs  a day x 3 days; then 4 tabs a day x 3 days; then 3 tabs a day x 3 days, then 2 tab a day x  3 days, then 1 tab a day x 3 d, then stop. Take pc.    Dispense:  48 tablet    Refill:  0   doxycycline (VIBRA-TABS) 100 MG tablet    Sig: Take 1 tablet (100 mg total) by mouth 2 (two) times daily.    Dispense:  20 tablet    Refill:  0   HYDROcodone-acetaminophen (NORCO) 7.5-325 MG tablet    Sig: Take 1 tablet by mouth every 6 (six) hours as needed for moderate pain.    Dispense:  20 tablet    Refill:  0   losartan (COZAAR) 100 MG tablet    Sig: Take 1 tablet (100 mg total) by mouth daily.    Dispense:  90 tablet    Refill:  3   predniSONE (DELTASONE) 10 MG tablet    Sig: Prednisone 10 mg: take 6 tabs a day x 3 days; then 4 tabs a day x 3 days; then 3 tabs a day x 3 days, then 2 tab a day x  3 days, then 1 tab a day x 3 d, then stop. Take pc.    Dispense:  48 tablet    Refill:  0   ketorolac (TORADOL) injection 60 mg      Follow-up: Return in about 1 week (around 12/28/2020) for a follow-up visit.  Sonda Primes, MD

## 2020-12-21 NOTE — Patient Instructions (Signed)
Gout  Gout is a condition that causes painful swelling of the joints. Gout is a type of inflammation of the joints (arthritis). This condition is caused by having too much uric acid in the body. Uric acid is a chemical that forms when the body breaks down substances called purines.Purines are important for building body proteins. When the body has too much uric acid, sharp crystals can form and build up inside the joints. This causes pain and swelling. Gout attacks can happen quickly and may be very painful (acute gout). Over time, the attacks can affect more joints and become more frequent (chronic gout). Gout can also cause uric acid to build up under the skin and inside thekidneys. What are the causes? This condition is caused by too much uric acid in your blood. This can happen because: Your kidneys do not remove enough uric acid from your blood. This is the most common cause. Your body makes too much uric acid. This can happen with some cancers and cancer treatments. It can also occur if your body is breaking down too many red blood cells (hemolytic anemia). You eat too many foods that are high in purines. These foods include organ meats and some seafood. Alcohol, especially beer, is also high in purines. A gout attack may be triggered by trauma or stress. What increases the risk? You are more likely to develop this condition if you: Have a family history of gout. Are male and middle-aged. Are male and have gone through menopause. Are obese. Frequently drink alcohol, especially beer. Are dehydrated. Lose weight too quickly. Have an organ transplant. Have lead poisoning. Take certain medicines, including aspirin, cyclosporine, diuretics, levodopa, and niacin. Have kidney disease. Have a skin condition called psoriasis. What are the signs or symptoms? An attack of acute gout happens quickly. It usually occurs in just one joint. The most common place is the big toe. Attacks often start  at night. Other joints that may be affected include joints of the feet, ankle, knee, fingers, wrist, or elbow. Symptoms of this condition may include: Severe pain. Warmth. Swelling. Stiffness. Tenderness. The affected joint may be very painful to touch. Shiny, red, or purple skin. Chills and fever. Chronic gout may cause symptoms more frequently. More joints may be involved. You may also have white or yellow lumps (tophi) on your hands or feet or in other areas near your joints. How is this diagnosed? This condition is diagnosed based on your symptoms, medical history, and physical exam. You may have tests, such as: Blood tests to measure uric acid levels. Removal of joint fluid with a thin needle (aspiration) to look for uric acid crystals. X-rays to look for joint damage. How is this treated? Treatment for this condition has two phases: treating an acute attack and preventing future attacks. Acute gout treatment may include medicines to reduce pain and swelling, including: NSAIDs. Steroids. These are strong anti-inflammatory medicines that can be taken by mouth (orally) or injected into a joint. Colchicine. This medicine relieves pain and swelling when it is taken soon after an attack. It can be given by mouth or through an IV. Preventive treatment may include: Daily use of smaller doses of NSAIDs or colchicine. Use of a medicine that reduces uric acid levels in your blood. Changes to your diet. You may need to see a dietitian about what to eat and drink to prevent gout. Follow these instructions at home: During a gout attack  If directed, put ice on the affected area: Put   ice in a plastic bag. Place a towel between your skin and the bag. Leave the ice on for 20 minutes, 2-3 times a day. Raise (elevate) the affected joint above the level of your heart as often as possible. Rest the joint as much as possible. If the affected joint is in your leg, you may be given crutches to  use. Follow instructions from your health care provider about eating or drinking restrictions.  Avoiding future gout attacks Follow a low-purine diet as told by your dietitian or health care provider. Avoid foods and drinks that are high in purines, including liver, kidney, anchovies, asparagus, herring, mushrooms, mussels, and beer. Maintain a healthy weight or lose weight if you are overweight. If you want to lose weight, talk with your health care provider. It is important that you do not lose weight too quickly. Start or maintain an exercise program as told by your health care provider. Eating and drinking Drink enough fluids to keep your urine pale yellow. If you drink alcohol: Limit how much you use to: 0-1 drink a day for women. 0-2 drinks a day for men. Be aware of how much alcohol is in your drink. In the [place] one drink equals one 12 oz bottle of beer (355 mL) one 5 oz glass of wine (148 mL), or one 1 oz glass of hard liquor (44 mL). General instructions Take over-the-counter and prescription medicines only as told by your health care provider. Do not drive or use heavy machinery while taking prescription pain medicine. Return to your normal activities as told by your health care provider. Ask your health care provider what activities are safe for you. Keep all follow-up visits as told by your health care provider. This is important. Contact a health care provider if you have: Another gout attack. Continuing symptoms of a gout attack after 10 days of treatment. Side effects from your medicines. Chills or a fever. Burning pain when you urinate. Pain in your lower back or belly. Get help right away if you: Have severe or uncontrolled pain. Cannot urinate. Summary Gout is painful swelling of the joints caused by inflammation. The most common site of pain is the big toe, but it can affect other joints in the body. Medicines and dietary changes can help to prevent and treat gout  attacks. This information is not intended to replace advice given to you by your health care provider. Make sure you discuss any questions you have with your healthcare provider. Document Revised: 09/10/2017 Document Reviewed: 09/10/2017 Elsevier Patient Education  2022 [employer]  

## 2020-12-26 ENCOUNTER — Telehealth: Payer: Self-pay | Admitting: Internal Medicine

## 2020-12-26 NOTE — Telephone Encounter (Signed)
Patient calling to inform medications he was prescribed is burning his stomach  Patient states that he was prescribed predniSONE (DELTASONE) 10 MG tablet and an antibiotic that he does not remember the name of  Patient states he will discontinue taking both medications until he receives a call back and is advised  Please call patient

## 2020-12-28 ENCOUNTER — Encounter: Payer: Self-pay | Admitting: Internal Medicine

## 2020-12-28 ENCOUNTER — Ambulatory Visit (INDEPENDENT_AMBULATORY_CARE_PROVIDER_SITE_OTHER): Payer: 59 | Admitting: Internal Medicine

## 2020-12-28 ENCOUNTER — Other Ambulatory Visit: Payer: Self-pay

## 2020-12-28 VITALS — BP 142/88 | HR 88 | Temp 98.1°F | Ht 67.0 in | Wt 202.0 lb

## 2020-12-28 DIAGNOSIS — M7022 Olecranon bursitis, left elbow: Secondary | ICD-10-CM

## 2020-12-28 MED ORDER — METHYLPREDNISOLONE ACETATE 40 MG/ML IJ SUSP
40.0000 mg | Freq: Once | INTRAMUSCULAR | Status: AC
  Administered 2020-12-28: 40 mg via INTRAMUSCULAR

## 2020-12-28 NOTE — Progress Notes (Signed)
Subjective:  Patient ID: Anthony Gill, male    DOB: 30-Apr-1970  Age: 50 y.o. MRN: [id number]  CC: Joint Swelling  This visit occurred during the SARS-CoV-2 public health emergency.  Safety protocols were in place, including screening questions prior to the visit, additional usage of staff PPE, and extensive cleaning of exam room while observing appropriate contact time as indicated for disinfecting solutions.    HPI Anthony Gill presents for f/up -  He complains of a 2 week hx of pain/swelling over the tip of his left elbow with no hx of injury or wound. He has not improved much with doxycycline and oral steroids.  Outpatient Medications Prior to Visit  Medication Sig Dispense Refill   allopurinol (ZYLOPRIM) 100 MG tablet Take 2 tablets (200 mg total) by mouth daily. 180 tablet 1   amLODipine (NORVASC) 5 MG tablet Take 1 tablet (5 mg total) by mouth daily. 90 tablet 1   doxycycline (VIBRA-TABS) 100 MG tablet Take 1 tablet (100 mg total) by mouth 2 (two) times daily. 20 tablet 0   fenofibrate (TRICOR) 145 MG tablet Take 1 tablet (145 mg total) by mouth daily. 90 tablet 1   HYDROcodone-acetaminophen (NORCO) 7.5-325 MG tablet Take 1 tablet by mouth every 6 (six) hours as needed for moderate pain. 20 tablet 0   losartan (COZAAR) 100 MG tablet Take 1 tablet (100 mg total) by mouth daily. 90 tablet 3   omega-3 acid ethyl esters (LOVAZA) 1 g capsule Take 2 capsules (2 g total) by mouth 2 (two) times daily. 360 capsule 1   pantoprazole (PROTONIX) 40 MG tablet Take 1 tablet (40 mg total) by mouth daily. 90 tablet 0   potassium chloride SA (KLOR-CON) 20 MEQ tablet Take 1 tablet (20 mEq total) by mouth 2 (two) times daily. 180 tablet 1   predniSONE (DELTASONE) 10 MG tablet Prednisone 10 mg: take 6 tabs a day x 3 days; then 4 tabs a day x 3 days; then 3 tabs a day x 3 days, then 2 tab a day x  3 days, then 1 tab a day x 3 d, then stop. Take pc. 48 tablet 0   No facility-administered  medications prior to visit.    ROS Review of Systems  Constitutional:  Negative for chills, fatigue and fever.  HENT: Negative.    Musculoskeletal:  Positive for arthralgias and joint swelling. Negative for myalgias.  Skin:  Negative for color change and rash.   Objective:  BP (!) 142/88 (BP Location: Right Arm, Patient Position: Sitting, Cuff Size: Large)   Pulse 88   Temp 98.1 F (36.7 C) (Oral)   Ht 5\' 7"  (1.702 m)   Wt 202 lb (91.6 kg)   SpO2 96%   BMI 31.64 kg/m   BP Readings from Last 3 Encounters:  12/28/20 (!) 142/88  08/31/20 (!) 146/98  05/12/20 (!) 150/104    Wt Readings from Last 3 Encounters:  12/28/20 202 lb (91.6 kg)  08/31/20 205 lb (93 kg)  05/12/20 212 lb 3.2 oz (96.3 kg)    Physical Exam Musculoskeletal:     Right elbow: Normal.     Left elbow: Swelling and effusion present. Decreased range of motion. Tenderness present.     Comments: ++ effusion, warmth, swelling over the olecranon bursa.    Lab Results  Component Value Date   WBC 6.4 05/09/2020   HGB 14.6 05/09/2020   HCT 41.9 05/09/2020   PLT 219.0 05/09/2020   GLUCOSE 148 (  H) 08/31/2020   CHOL 147 05/09/2020   TRIG 838.0 (H) 08/31/2020   HDL 31.90 (L) 05/09/2020   LDLDIRECT 39.0 05/09/2020   LDLCALC 42 06/09/2013   ALT 26 05/09/2020   AST 22 05/09/2020   NA 139 08/31/2020   K 3.4 (L) 08/31/2020   CL 101 08/31/2020   CREATININE 1.01 08/31/2020   BUN 7 08/31/2020   CO2 28 08/31/2020   TSH 0.82 07/12/2019   PSA 0.74 08/31/2020   HGBA1C 6.0 08/31/2020    After informed verbal consent was obtained. Using Betadine for cleansing and 2% Lidocaine with epinephrine for anesthetic (2 cc's used) for local anesthesia, with sterile technique a 21 gauge needle was used to enter the bursa and 9 cc's or serosanguinous fluid was removed (see photo), then 40 mg depo-medrol was injection into the bursa. Hemostasis was obtained by pressure. The specimen is labeled and sent. The procedure was well  tolerated without complications.   Component Ref Range & Units 4 d ago   Site  NOT GIVEN   Color, Synovial STRAW/YELLOW ORANGE Abnormal    Appearance-Synovial CLEAR/HAZY CLOUDY Abnormal    WBC, Synovial <150 cells/uL 5,690 High    Neutrophil, Synovial 0 - 24 % 90 High    Lymphocytes-Synovial Fld 0 - 74 % 4   Monocyte/Macrophage 0 - 69 % 6   Eosinophils-Synovial 0 - 2 % 0   Basophils, % 0 % 0   Synoviocytes, % 0 - 15 % 0   Crystals, Fluid NONE SEEN /HPF   Comment: .  Positive for monosodium urates  .  Intracellular  Extracellular     Assessment & Plan:   Anthony Gill was seen today for joint swelling.  Diagnoses and all orders for this visit:  Olecranon bursitis, left elbow- Joint aspiration done. The clx is negative. Stain for crystals is + for monosodium urate crystals, c/w gout. The steroid injectio should alleviate this. -     WOUND CULTURE; Future -     Cell count + diff,  w/ cryst-synvl fld; Future -     Cell count + diff,  w/ cryst-synvl fld -     WOUND CULTURE -     methylPREDNISolone acetate (DEPO-MEDROL) injection 40 mg  I am having Anthony Gill maintain his amLODipine, allopurinol, omega-3 acid ethyl esters, fenofibrate, potassium chloride SA, pantoprazole, doxycycline, HYDROcodone-acetaminophen, losartan, and predniSONE. We administered methylPREDNISolone acetate.  Meds ordered this encounter  Medications   methylPREDNISolone acetate (DEPO-MEDROL) injection 40 mg      Follow-up: Return in about 1 week (around 01/04/2021).  Sanda Linger, MD

## 2020-12-28 NOTE — Patient Instructions (Signed)
Elbow Bursitis Rehab Ask your health care provider which exercises are safe for you. Do exercises exactly as told by your health care provider and adjust them as directed. It is normal to feel mild stretching, pulling, tightness, or discomfort as you do these exercises. Stop right away if you feel sudden pain or your pain gets worse. Do not begin these exercises until told by your health care provider. Stretching and range-of-motion exercises These exercises warm up your muscles and joints and improve the movement andflexibility of your elbow. The exercises also help to relieve pain and swelling. Elbow flexion, assisted Stand or sit with your left / right arm at your side. Use your other hand to gently push your left / right hand toward your shoulder (assisted) while bending your elbow (flexion). Hold this position for __________ seconds. Slowly return your left / right arm to the starting position. Repeat __________ times. Complete this exercise __________ times a day. Elbow extension, assisted Lie on your back in a comfortable position that allows you to relax your arm muscles. Place a folded towel under your left / right upper arm so that your elbow and shoulder are at the same height. Use your other arm to raise your left / right arm (assisted) until your elbow and hand do not rest on the bed or towel. Hold your left / right arm out straight with your other hand supporting it. Let the weight of your hand straighten your elbow (extension). You should feel a stretch on the inside of your elbow. Keep your arm and chest muscles relaxed. If directed, add a small wrist weight or hand weight to increase the stretch. Hold this position for __________ seconds. Slowly release the stretch and return to the starting position. Repeat __________ times. Complete this exercise __________ times a day. Elbow flexion, active Stand or sit with your left / right elbow bent and your palm facing in, toward your  body. Bend your elbow as far as you can using only your arm muscles (active flexion). Hold this position for __________ seconds. Slowly return to the starting position. Repeat __________ times. Complete this exercise __________ times a day. Elbow extension, active Stand or sit with your left / right elbow bent and your palm facing in, toward your body. Slowly straighten your elbow using only your arm muscles (active extension). Stop when you feel a gentle stretch at the front of your arm, or when your arm is straight. Hold this position for __________ seconds. Slowly return to the starting position. Repeat __________ times. Complete this exercise __________ times a day. Strengthening exercises These exercises build strength and endurance in your elbow. Endurance is theability to use your muscles for a long time, even after they get tired. Elbow flexion, isometric Stand or sit with your left / right arm at waist height. Your palm should face in, toward your body. Place your other hand on top of your left / right forearm. Gently push down while you resist with your left / right arm (isometric flexion). Use about 50% effort with both arms. You may be instructed to use more and more effort with your arms each week. Try not to let your left / right arm move during the exercise. Hold this position for __________ seconds. Let your muscles relax completely before you repeat the exercise. Repeat __________ times. Complete this exercise __________ times a day. Elbow extension, isometric  Stand or sit with your left / right arm at waist height. Your palm should face in,   toward your body. Place your other hand on the bottom of your left / right forearm. Gently push up while you resist with your left / right arm (isometric extension). Use about 50% effort with both arms. You may be instructed to use more and more effort with your arms each week. Try not to let your left / right arm move during the  exercise. Hold this position for __________ seconds. Let your muscles relax completely before you repeat the exercise. Repeat __________ times. Complete this exercise __________ times a day. Biceps curls Sit on a stable chair without armrests, or stand up. Hold a _________ weight in your left / right hand. Your palm should face out, away from your body, at the starting position. Bend your left / right elbow and move your hand up toward your shoulder. Keep your elbow at your side while you bend it. Slowly return to the starting position. Repeat __________ times. Complete this exercise __________ times a day. Triceps curls  Lie on your back. Hold a _________ weight in your left / right hand. Bend your left / right elbow to a 90-degree angle (right angle), so the weight is in front of your face, over your chest, and your elbow is pointed up to the ceiling. Straighten your elbow, raising your hand toward the ceiling. Use your other hand to support your left / right upper arm and to keep it still. Slowly return to the starting position. Repeat __________ times. Complete this exercise __________ times a day. This information is not intended to replace advice given to you by your health care provider. Make sure you discuss any questions you have with your healthcare provider. Document Revised: 06/11/2018 Document Reviewed: 03/11/2018 Elsevier Patient Education  2022 [employer]  

## 2021-01-01 LAB — WOUND CULTURE: RESULT:: NO GROWTH

## 2021-01-01 LAB — SYNOVIAL FLUID ANALYSIS, COMPLETE
Basophils, %: 0 %
Eosinophils-Synovial: 0 % (ref 0–2)
Lymphocytes-Synovial Fld: 4 % (ref 0–74)
Monocyte/Macrophage: 6 % (ref 0–69)
Neutrophil, Synovial: 90 % — ABNORMAL HIGH (ref 0–24)
Synoviocytes, %: 0 % (ref 0–15)
WBC, Synovial: 5690 cells/uL — ABNORMAL HIGH (ref <150)

## 2021-01-03 ENCOUNTER — Other Ambulatory Visit: Payer: Self-pay | Admitting: Internal Medicine

## 2021-01-03 DIAGNOSIS — M109 Gout, unspecified: Secondary | ICD-10-CM | POA: Insufficient documentation

## 2021-01-03 MED ORDER — DICLOFENAC SODIUM 50 MG PO TBEC: 50.0000 mg | DELAYED_RELEASE_TABLET | Freq: Three times a day (TID) | ORAL | 1 refills | Status: AC

## 2021-01-04 ENCOUNTER — Telehealth: Payer: Self-pay | Admitting: Internal Medicine

## 2021-01-04 ENCOUNTER — Encounter: Payer: Self-pay | Admitting: Internal Medicine

## 2021-01-04 NOTE — Telephone Encounter (Signed)
Patient called back to check on the status of the work note. Stated he had spoken to the assistant yesterday regarding the test results. His elbow is starting to flare up and the pain has become severe again. His job has allowed him to some light duty but the pain has become more severe where he can not work. Requesting a work note to take him out until his appointment on 01/08/2021.   Please advise

## 2021-01-07 NOTE — Progress Notes (Signed)
Subjective:    Patient ID: Anthony Gill, male    DOB: 1970/11/29, 50 y.o.   MRN: [id number]  This visit occurred during the SARS-CoV-2 public health emergency.  Safety protocols were in place, including screening questions prior to the visit, additional usage of staff PPE, and extensive cleaning of exam room while observing appropriate contact time as indicated for disinfecting solutions.    HPI The patient is here for an acute visit.   Left elbow swollen and pain - he has been diagnosed with left olecranon bursitis.  He has been seen at urgent care and twice in our office already.  He has had prednisone 20 mg bid x 5 days, colchicine,  doxycyline which she was only able to tolerate 2 pills before having stomach upset and stopping the medication, vicodin, prednisone taper 60 mg x 3 days down, The joint was injected with steroid one week ago.  The steroids have helped, but his symptoms have recurred.  He describes a glass like pain in the elbow.  Pain can be a 2/10 at rest.  With increased movement he has increased pain.  The cold air on the elbow hurts.  He keeps it covered.  If he moves the elbow too much he gets pain in his shoulder.    The elbow catches at times and his fingers lock up.  He has good ROM of the elbow. No weakness.   Medications and allergies reviewed with patient and updated if appropriate.  Patient Active Problem List   Diagnosis Date Noted   Gouty bursitis of left olecranon 01/03/2021   Encounter for general adult medical examination with abnormal findings 08/31/2020   DOE (dyspnea on exertion) 08/31/2020   Need for hepatitis C screening test 08/31/2020   Diuretic-induced hypokalemia 08/31/2020   Primary hypertriglyceridemia 05/09/2020   Prediabetes 05/09/2020   Essential hypertension 05/02/2016   Family history of early CAD 05/02/2016   GERD (gastroesophageal reflux disease) 06/09/2013   Gout 09/15/2011    Current Outpatient Medications on File Prior  to Visit  Medication Sig Dispense Refill   allopurinol (ZYLOPRIM) 100 MG tablet Take 2 tablets (200 mg total) by mouth daily. 180 tablet 1   amLODipine (NORVASC) 5 MG tablet Take 1 tablet (5 mg total) by mouth daily. 90 tablet 1   diclofenac (VOLTAREN) 50 MG EC tablet Take 1 tablet (50 mg total) by mouth 3 (three) times daily for 10 days. 30 tablet 1   fenofibrate (TRICOR) 145 MG tablet Take 1 tablet (145 mg total) by mouth daily. 90 tablet 1   losartan (COZAAR) 100 MG tablet Take 1 tablet (100 mg total) by mouth daily. 90 tablet 3   omega-3 acid ethyl esters (LOVAZA) 1 g capsule Take 2 capsules (2 g total) by mouth 2 (two) times daily. 360 capsule 1   pantoprazole (PROTONIX) 40 MG tablet Take 1 tablet (40 mg total) by mouth daily. 90 tablet 0   potassium chloride SA (KLOR-CON) 20 MEQ tablet Take 1 tablet (20 mEq total) by mouth 2 (two) times daily. 180 tablet 1   No current facility-administered medications on file prior to visit.    Past Medical History:  Diagnosis Date   Chicken pox    Epigastric pain 04/05/2014   Essential hypertension 05/02/2016   Gout    Headache disorder 2015   Tension w/medication overuse component suspected by neurologist (Dr. Everlena Cooper).  MRI brain ordered.   Migraines    PUD (peptic ulcer disease) 03/14/2014  Past Surgical History:  Procedure Laterality Date   COLONOSCOPY N/A 04/10/2014   Procedure: COLONOSCOPY;  Surgeon: Barrie Folk, MD;  Location: [facility] ENDOSCOPY;  Service: Endoscopy;  Laterality: N/A;   ESOPHAGOGASTRODUODENOSCOPY N/A 04/10/2014   Procedure: ESOPHAGOGASTRODUODENOSCOPY (EGD);  Surgeon: Barrie Folk, MD;  Location: [facility] ENDOSCOPY;  Service: Endoscopy;  Laterality: N/A;   HERNIA REPAIR  2006   WISDOM TOOTH EXTRACTION      Social History   Socioeconomic History   Marital status: Single    Spouse name: Not on file   Number of children: Not on file   Years of education: Not on file   Highest education level: Not on file  Occupational History    Not on file  Tobacco Use   Smoking status: Former    Years: 3.00    Types: Cigarettes    Quit date: 03/04/2014    Years since quitting: 6.8   Smokeless tobacco: Never  Vaping Use   Vaping Use: Never used  Substance and Sexual Activity   Alcohol use: Yes    Alcohol/week: 3.0 standard drinks    Types: 3 Cans of beer per week    Comment: social   Drug use: No    Comment: former user   Sexual activity: Yes    Partners: Female    Birth control/protection: None    Comment: married  Other Topics Concern   Not on file  Social History Narrative   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   Grew up in [place]   Married   1 son   Social Determinants of Health   Financial Resource Strain: Not on file  Food Insecurity: Not on file  Transportation Needs: Not on file  Physical Activity: Not on file  Stress: Not on file  Social Connections: Not on file    Family History  Problem Relation Age of Onset   Diabetes Mother    Hyperlipidemia Mother    Hypertension Other        family history   Prostate cancer Father        family history   Heart disease Father    Heart attack Father 87       Deceased   Hyperlipidemia Father    Heart disease Paternal Uncle    Diabetes Sister        x3   Cancer Other        Aunts & Uncles   Cancer Other        Grandparents   Heart attack Paternal Uncle    Hyperlipidemia Sister    Diabetes Brother        x1   Hyperlipidemia Brother        x1    Review of Systems  Constitutional:  Negative for chills and fever.  Neurological:  Negative for weakness and numbness.      Objective:   Vitals:   01/08/21 0806  BP: (!) 140/96  Pulse: 88  Temp: 98 F (36.7 C)  SpO2: 98%   BP Readings from Last 3 Encounters:  01/08/21 (!) 140/96  12/28/20 (!) 142/88  08/31/20 (!) 146/98   Wt Readings from Last 3 Encounters:  01/08/21 200 lb (90.7 kg)  12/28/20 202 lb (91.6 kg)  08/31/20 205 lb (93 kg)   Body mass index is 31.32 kg/m.    Physical Exam Constitutional:      General: He is not in acute distress.    Appearance: Normal appearance. He is not  ill-appearing.  HENT:     Head: Normocephalic and atraumatic.  Musculoskeletal:     Comments: Left elbow with swollen bursa that is tender to palpation.  Slight warmth.  Good range of motion of elbow, with some pain  Neurological:     Mental Status: He is alert.           Assessment & Plan:    Gouty bursitis of left olecranon: Subacute He did have gout crystals in the aspirated fluid from his bursa 1 week ago, there was also a large number of white blood cells He has had 2 rounds of oral steroids, Depo-Medrol injection into the bursa, pain medication, colchicine He took 2 doses of an antibiotic, but then had stomach upset and had to stop it The steroids have helped, but his symptoms have recurred Discussed today that we could do more medication and/or consider referral to orthopedics for further evaluation and treatment At this point I think it is best for him to see orthopedics He does not appear to be ill and because his pain is mildly time I do not think he has a septic joint, but it is a possibility Will refer to Ortho and hopefully he will be able to get in this week We will hold off on any medication at this time, but he knows to call immediately if his symptoms change

## 2021-01-08 ENCOUNTER — Ambulatory Visit (INDEPENDENT_AMBULATORY_CARE_PROVIDER_SITE_OTHER): Payer: 59 | Admitting: Internal Medicine

## 2021-01-08 ENCOUNTER — Other Ambulatory Visit: Payer: Self-pay

## 2021-01-08 ENCOUNTER — Encounter: Payer: Self-pay | Admitting: Internal Medicine

## 2021-01-08 VITALS — BP 140/96 | HR 88 | Temp 98.0°F | Ht 67.0 in | Wt 200.0 lb

## 2021-01-08 DIAGNOSIS — M109 Gout, unspecified: Secondary | ICD-10-CM

## 2021-01-08 NOTE — Patient Instructions (Addendum)
     A referral was ordered for [facility] orthopedics with [facility].       Someone from their office will call you to schedule an appointment.    If your symptoms worsen before you see orthopedics please call us.

## 2021-01-11 ENCOUNTER — Encounter: Payer: Self-pay | Admitting: Orthopedic Surgery

## 2021-01-11 ENCOUNTER — Ambulatory Visit (INDEPENDENT_AMBULATORY_CARE_PROVIDER_SITE_OTHER): Payer: 59 | Admitting: Orthopedic Surgery

## 2021-01-11 ENCOUNTER — Other Ambulatory Visit: Payer: Self-pay

## 2021-01-11 VITALS — BP 138/88 | HR 85 | Ht 67.0 in | Wt 200.0 lb

## 2021-01-11 DIAGNOSIS — M109 Gout, unspecified: Secondary | ICD-10-CM

## 2021-01-11 NOTE — Progress Notes (Signed)
Office Visit Note   Patient: Anthony Gill           Date of Birth: 12-30-1970           MRN: [id number] Visit Date: 01/11/2021              Requested by: Pincus Sanes, MD [address] [place],  [place] [address] PCP: Etta Grandchild, MD   Assessment & Plan: Visit Diagnoses:  1. Gouty bursitis of left olecranon     Plan: Discussed the nature of olecranon bursitis.  The bursa does not look infected today.  Overall, his symptoms have been improved.  We discussed treatment options for olecranon burisitis including compressive dressing, NSAIDs, aspiration, and bursectomy.  At this point, given the improvement, he will continue with NSAIDs and a compressive dressing.   Follow-Up Instructions: No follow-ups on file.   Orders:  No orders of the defined types were placed in this encounter.  No orders of the defined types were placed in this encounter.     Procedures: No procedures performed   Clinical Data: No additional findings.   Subjective: Chief Complaint  Patient presents with   Left Elbow - New Patient (Initial Visit)    This is a 50 year old right-hand-dominant male who presents with pain and swelling involving the left olecranon bursa.  Started about 3 weeks ago.  It was atraumatic.  He was seen by his primary care doctor who gave him a dose of prednisone and was told he had a gout flare.  He does have a history of gout that worsened when he was put on recent new blood pressure medications.  He had the bursa aspirated last week.  Was put on a Medrol Dosepak with significant improvement in symptoms.  He was previously unable to move the elbow but now has full range of motion.  He was given a prescription for an antibiotic but this upset his stomach.  He denies any drainage today.  He denies any systemic symptoms today.  Overall he thinks his symptoms are greatly improving.   Review of Systems   Objective: Vital Signs: BP 138/88 (BP Location: Right Arm,  Patient Position: Sitting, Cuff Size: Large)   Pulse 85   Ht 5\' 7"  (1.702 m)   Wt 200 lb (90.7 kg)   SpO2 98%   BMI 31.32 kg/m   Physical Exam Constitutional:      Appearance: Normal appearance.  Cardiovascular:     Rate and Rhythm: Normal rate.     Pulses: Normal pulses.  Pulmonary:     Effort: Pulmonary effort is normal.  Skin:    General: Skin is warm and dry.     Capillary Refill: Capillary refill takes less than 2 seconds.  Neurological:     Mental Status: He is alert.    Left Elbow Exam   Tenderness  Left elbow tenderness location: TTP over mildly swollen olecranon bursa.   Range of Motion  The patient has normal left elbow ROM.  Other  Sensation: normal Pulse: present  Comments:  No erythema or drainage.  No signs of infection.      Specialty Comments:  No specialty comments available.  Imaging: No results found.   PMFS History: Patient Active Problem List   Diagnosis Date Noted   Gouty bursitis of left olecranon 01/03/2021   Encounter for general adult medical examination with abnormal findings 08/31/2020   DOE (dyspnea on exertion) 08/31/2020   Need for hepatitis C screening test  08/31/2020   Diuretic-induced hypokalemia 08/31/2020   Primary hypertriglyceridemia 05/09/2020   Prediabetes 05/09/2020   Essential hypertension 05/02/2016   Family history of early CAD 05/02/2016   GERD (gastroesophageal reflux disease) 06/09/2013   Gout 09/15/2011   Past Medical History:  Diagnosis Date   Chicken pox    Epigastric pain 04/05/2014   Essential hypertension 05/02/2016   Gout    Headache disorder 2015   Tension w/medication overuse component suspected by neurologist (Dr. Everlena Cooper).  MRI brain ordered.   Migraines    PUD (peptic ulcer disease) 03/14/2014    Family History  Problem Relation Age of Onset   Diabetes Mother    Hyperlipidemia Mother    Hypertension Other        family history   Prostate cancer Father        family history   Heart  disease Father    Heart attack Father 31       Deceased   Hyperlipidemia Father    Heart disease Paternal Uncle    Diabetes Sister        x3   Cancer Other        Aunts & Uncles   Cancer Other        Grandparents   Heart attack Paternal Uncle    Hyperlipidemia Sister    Diabetes Brother        x1   Hyperlipidemia Brother        x1    Past Surgical History:  Procedure Laterality Date   COLONOSCOPY N/A 04/10/2014   Procedure: COLONOSCOPY;  Surgeon: Barrie Folk, MD;  Location: [facility] ENDOSCOPY;  Service: Endoscopy;  Laterality: N/A;   ESOPHAGOGASTRODUODENOSCOPY N/A 04/10/2014   Procedure: ESOPHAGOGASTRODUODENOSCOPY (EGD);  Surgeon: Barrie Folk, MD;  Location: [facility] ENDOSCOPY;  Service: Endoscopy;  Laterality: N/A;   HERNIA REPAIR  2006   WISDOM TOOTH EXTRACTION     Social History   Occupational History   Not on file  Tobacco Use   Smoking status: Former    Years: 3.00    Types: Cigarettes    Quit date: 03/04/2014    Years since quitting: 6.8   Smokeless tobacco: Never  Vaping Use   Vaping Use: Never used  Substance and Sexual Activity   Alcohol use: Yes    Alcohol/week: 3.0 standard drinks    Types: 3 Cans of beer per week    Comment: social   Drug use: No    Comment: former user   Sexual activity: Yes    Partners: Female    Birth control/protection: None    Comment: married

## 2021-02-21 ENCOUNTER — Other Ambulatory Visit: Payer: Self-pay

## 2021-02-21 ENCOUNTER — Ambulatory Visit (INDEPENDENT_AMBULATORY_CARE_PROVIDER_SITE_OTHER): Payer: 59 | Admitting: Internal Medicine

## 2021-02-21 ENCOUNTER — Encounter: Payer: Self-pay | Admitting: Internal Medicine

## 2021-02-21 DIAGNOSIS — M109 Gout, unspecified: Secondary | ICD-10-CM

## 2021-02-21 MED ORDER — PREDNISONE 20 MG PO TABS: 40.0000 mg | ORAL_TABLET | Freq: Every day | ORAL | 0 refills | Status: DC

## 2021-02-21 NOTE — Patient Instructions (Signed)
We have drained out the fluid from the elbow. This may come back again. If this comes back fairly quickly I would recommend to follow up with the orthopedic as they can fix this for long term.  We have sent in prednisone to take 2 pills daily for 5 days to calm this down.

## 2021-02-21 NOTE — Progress Notes (Signed)
° °  Subjective:   Patient ID: Anthony Gill, male    DOB: [birth year], 50 y.o.   MRN: [id number]  HPI The patient is a 50 YO man coming in for left elbow problem.   Review of Systems  Constitutional: Negative.   HENT: Negative.    Eyes: Negative.   Respiratory:  Negative for cough, chest tightness and shortness of breath.   Cardiovascular:  Negative for chest pain, palpitations and leg swelling.  Gastrointestinal:  Negative for abdominal distention, abdominal pain, constipation, diarrhea, nausea and vomiting.  Musculoskeletal:  Positive for joint swelling and myalgias.  Skin: Negative.   Neurological: Negative.   Psychiatric/Behavioral: Negative.     Objective:  Physical Exam Constitutional:      Appearance: He is well-developed.  HENT:     Head: Normocephalic and atraumatic.  Cardiovascular:     Rate and Rhythm: Normal rate and regular rhythm.  Pulmonary:     Effort: Pulmonary effort is normal. No respiratory distress.     Breath sounds: Normal breath sounds. No wheezing or rales.  Abdominal:     General: Bowel sounds are normal. There is no distension.     Palpations: Abdomen is soft.     Tenderness: There is no abdominal tenderness. There is no rebound.  Musculoskeletal:        General: Tenderness present.     Cervical back: Normal range of motion.     Comments: Left olecranon bursa with swelling and tenderness. See procedure note and aspiration  Skin:    General: Skin is warm and dry.  Neurological:     Mental Status: He is alert and oriented to person, place, and time.     Coordination: Coordination normal.    Vitals:   02/21/21 0943  BP: 126/80  Pulse: 92  Resp: 18  SpO2: 99%  Weight: 207 lb 9.6 oz (94.2 kg)  Height: 5\' 7"  (1.702 m)    This visit occurred during the SARS-CoV-2 public health emergency.  Safety protocols were in place, including screening questions prior to the visit, additional usage of staff PPE, and extensive cleaning of exam room while  observing appropriate contact time as indicated for disinfecting solutions.   Assessment & Plan:

## 2021-02-21 NOTE — Assessment & Plan Note (Signed)
Left bursa aspiration done with removal of 5 mL approx bloody fluid which was not sent for analysis. Recent analysis proving gouty arthritis end of Oct 2022. He is taking allopurinol 200 mg daily. Rx prednisone 5 day course. Advised if this is recurrent again return to orthopedic and consider bursectomy.

## 2021-02-21 NOTE — Progress Notes (Signed)
Patient ID: Anthony Gill, male   DOB: 1970/12/18, 49 y.o.   MRN: [id number] Bursa Aspiration without Injection Procedure Note  Pre-operative Diagnosis: left Olecranon bursitis  Post-operative Diagnosis: same  Indications: treatment of symptomatic bursal effusion  Anesthesia:  topical ethyl chloride spray      Procedure Details   After a discussion of the risks and benefits with the patient (including the possibility that any manipulation of the bursa could introduce infection, worsening the current situation significantly), verbal consent was obtained for the procedure. The joint was prepped with alcohol and topical ethyl chloride spray was applied. An 18 gauge needle was introduced into the fluid collection and 5 ml of serosanguinous fluid was removed and discarded. No corticosteroids were given. The injection site was cleansed with topical isopropyl alcohol and a dressing was applied.  Complications:  None; patient tolerated the procedure well.

## 2021-03-17 ENCOUNTER — Other Ambulatory Visit: Payer: Self-pay | Admitting: Internal Medicine

## 2021-03-17 DIAGNOSIS — K219 Gastro-esophageal reflux disease without esophagitis: Secondary | ICD-10-CM

## 2021-04-28 ENCOUNTER — Other Ambulatory Visit: Payer: Self-pay | Admitting: Internal Medicine

## 2021-04-28 DIAGNOSIS — M1A0790 Idiopathic chronic gout, unspecified ankle and foot, without tophus (tophi): Secondary | ICD-10-CM

## 2021-04-28 DIAGNOSIS — I10 Essential (primary) hypertension: Secondary | ICD-10-CM

## 2021-07-09 ENCOUNTER — Encounter: Payer: Self-pay | Admitting: Internal Medicine

## 2021-07-09 ENCOUNTER — Ambulatory Visit (INDEPENDENT_AMBULATORY_CARE_PROVIDER_SITE_OTHER): Payer: Commercial Managed Care - HMO | Admitting: Internal Medicine

## 2021-07-09 VITALS — BP 130/84 | HR 85 | Temp 98.9°F | Ht 67.0 in | Wt 203.0 lb

## 2021-07-09 DIAGNOSIS — R3915 Urgency of urination: Secondary | ICD-10-CM | POA: Diagnosis not present

## 2021-07-09 DIAGNOSIS — R7303 Prediabetes: Secondary | ICD-10-CM

## 2021-07-09 DIAGNOSIS — M79604 Pain in right leg: Secondary | ICD-10-CM | POA: Insufficient documentation

## 2021-07-09 DIAGNOSIS — I10 Essential (primary) hypertension: Secondary | ICD-10-CM | POA: Diagnosis not present

## 2021-07-09 DIAGNOSIS — M79605 Pain in left leg: Secondary | ICD-10-CM

## 2021-07-09 LAB — URINALYSIS, ROUTINE W REFLEX MICROSCOPIC
Bilirubin Urine: NEGATIVE
Hgb urine dipstick: NEGATIVE
Ketones, ur: NEGATIVE
Leukocytes,Ua: NEGATIVE
Nitrite: NEGATIVE
RBC / HPF: NONE SEEN (ref 0–?)
Specific Gravity, Urine: 1.015 (ref 1.000–1.030)
Total Protein, Urine: NEGATIVE
Urine Glucose: NEGATIVE
Urobilinogen, UA: 0.2 (ref 0.0–1.0)
pH: 6 (ref 5.0–8.0)

## 2021-07-09 MED ORDER — CYCLOBENZAPRINE HCL 5 MG PO TABS: 5.0000 mg | ORAL_TABLET | Freq: Three times a day (TID) | ORAL | 0 refills | Status: DC | PRN

## 2021-07-09 MED ORDER — MELOXICAM 15 MG PO TABS: 15.0000 mg | ORAL_TABLET | Freq: Every day | ORAL | 1 refills | Status: DC | PRN

## 2021-07-09 NOTE — Progress Notes (Signed)
Patient ID: Anthony Gill, male   DOB: 11/08/1970, 51 y.o.   MRN: [id number] ? ? ? ?    Chief Complaint: follow up posterior upper leg pain spasm x 1 wk ? ?     HPI:  Anthony JanJoseph J Llorens is a 51 y.o. 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  Now c/o daily mild to mod intermittent campy spasm to the post upper legs between the buttocks and the knees; without lower back pain or pain distal to the legs, worse at night it seems with lying in bed, as well as first steps to getting up in the night to the BR or first thing in the AM.  Seems some better with rest and no work on may 6, then happened again with getting out of car after church going home on may 7.  No falls a.  Denies worsening reflux, abd pain, dysphagia, n/v, bowel change or blood but also has had intemittent mild urinary urgency as well in the past 2 days for unclear reason. Denies urinary symptoms such as dysuria, frequency, flank pain, hematuria or n/v, fever, chills. ?      ?Wt Readings from Last 3 Encounters:  ?07/09/21 203 lb (92.1 kg)  ?02/21/21 207 lb 9.6 oz (94.2 kg)  ?01/11/21 200 lb (90.7 kg)  ? ?BP Readings from Last 3 Encounters:  ?07/09/21 130/84  ?02/21/21 126/80  ?01/11/21 138/88  ? ?      ?Past Medical History:  ?Diagnosis Date  ? Chicken pox   ? Epigastric pain 04/05/2014  ? Essential hypertension 05/02/2016  ? Gout   ? Headache disorder 2015  ? Tension w/medication overuse component suspected by neurologist (Dr. Everlena CooperJaffe).  MRI brain ordered.  ? Migraines   ? PUD (peptic ulcer disease) 03/14/2014  ? ?Past Surgical History:  ?Procedure Laterality Date  ? COLONOSCOPY N/A 04/10/2014  ? Procedure: COLONOSCOPY;  Surgeon: Barrie FolkJohn C Hayes, MD;  [facility];  Service: Endoscopy;  Laterality: N/A;  ?  ESOPHAGOGASTRODUODENOSCOPY N/A 04/10/2014  ? Procedure: ESOPHAGOGASTRODUODENOSCOPY (EGD);  Surgeon: Barrie FolkJohn C Hayes, MD;  [facility];  Service: Endoscopy;  Laterality: N/A;  ? HERNIA REPAIR  2006  ? WISDOM TOOTH EXTRACTION    ? ? reports that he quit smoking about 7 years ago. His smoking use included cigarettes. He has never used smokeless tobacco. He reports current alcohol use of about 3.0 standard drinks per week. He reports that he does not use drugs. ?family history includes Cancer in some other family members; Diabetes in his brother, mother, and sister; Heart attack in his paternal uncle; Heart attack (age of onset: 5180) in his father; Heart disease in his father and paternal uncle; Hyperlipidemia in his brother, father, mother, and sister; Hypertension in an other family member; Prostate cancer in his father. ?Allergies  ?Allergen Reactions  ? Ciprofloxacin Itching  ? Colchicine   ?  hives  ? Doxycycline   ?  Stomach upset  ? Indapamide   ?  Gout, cramps  ? ?Current Outpatient Medications on File Prior to Visit  ?Medication Sig Dispense Refill  ? allopurinol (ZYLOPRIM) 100 MG tablet Take 2 tablets by mouth once daily 180 tablet 0  ? losartan (COZAAR) 100 MG tablet Take 1 tablet (100  mg total) by mouth daily. 90 tablet 3  ? pantoprazole (PROTONIX) 40 MG tablet Take 1 tablet by mouth once daily 90 tablet 0  ? amLODipine (NORVASC) 5 MG tablet Take 1 tablet by mouth once daily (Patient not taking: Reported on 07/09/2021) 90 tablet 0  ? diclofenac (VOLTAREN) 50 MG EC tablet Take 50 mg by mouth 3 (three) times daily. (Patient not taking: Reported on 07/09/2021)    ? fenofibrate (TRICOR) 145 MG tablet Take 1 tablet (145 mg total) by mouth daily. (Patient not taking: Reported on 07/09/2021) 90 tablet 1  ? omega-3 acid ethyl esters (LOVAZA) 1 g capsule Take 2 capsules (2 g total) by mouth 2 (two) times daily. (Patient not taking: Reported on 07/09/2021) 360 capsule 1  ? potassium chloride SA (KLOR-CON) 20 MEQ tablet  Take 1 tablet (20 mEq total) by mouth 2 (two) times daily. (Patient not taking: Reported on 07/09/2021) 180 tablet 1  ? predniSONE (DELTASONE) 20 MG tablet Take 2 tablets (40 mg total) by mouth daily with breakfast. (Patient not taking: Reported on 07/09/2021) 10 tablet 0  ? ?No current facility-administered medications on file prior to visit.  ? ?     ROS:  All others reviewed and negative. ? ?Objective  ? ?     PE:  BP 130/84 (BP Location: Right Arm, Patient Position: Sitting, Cuff Size: Large)   Pulse 85   Temp 98.9 ?F (37.2 ?C) (Oral)   Ht 5\' 7"  (1.702 m)   Wt 203 lb (92.1 kg)   SpO2 97%   BMI 31.79 kg/m?  ? ?              Constitutional: Pt appears in NAD ?              HENT: Head: NCAT.  ?              Right Ear: External ear normal.   ?              Left Ear: External ear normal.  ?              Eyes: . Pupils are equal, round, and reactive to light. Conjunctivae and EOM are normal ?              Nose: without d/c or deformity ?              Neck: Neck supple. Gross normal ROM ?              Cardiovascular: Normal rate and regular rhythm.   ?              Pulmonary/Chest: Effort normal and breath sounds without rales or wheezing.  ?              Abd:  Soft, NT, ND, + BS, no organomegaly ?              Neurological: Pt is alert. At baseline orientation, motor grossly intact ?              Tender hamstrings bilateral ?              Skin: Skin is warm. No rashes, no other new lesions, LE edema - none ?              Psychiatric: Pt behavior is normal without agitation  ? ?Micro: none ? ?Cardiac tracings I have personally interpreted today:  none ? ?Pertinent Radiological findings (summarize): none  ? ?  Lab Results  ?Component Value Date  ? WBC 6.4 05/09/2020  ? HGB 14.6 05/09/2020  ? HCT 41.9 05/09/2020  ? PLT 219.0 05/09/2020  ? GLUCOSE 148 (H) 08/31/2020  ? CHOL 147 05/09/2020  ? TRIG 838.0 (H) 08/31/2020  ? HDL 31.90 (L) 05/09/2020  ? LDLDIRECT 39.0 05/09/2020  ? LDLCALC 42 06/09/2013  ? ALT 26 05/09/2020  ?  AST 22 05/09/2020  ? NA 139 08/31/2020  ? K 3.4 (L) 08/31/2020  ? CL 101 08/31/2020  ? CREATININE 1.01 08/31/2020  ? BUN 7 08/31/2020  ? CO2 28 08/31/2020  ? TSH 0.82 07/12/2019  ? PSA 0.74 08/31/2020  ? HGBA1C 6.0 08/31/2020  ? ?Assessment/Plan:  ?AVA DEGUIRE is a 51 y.o. Black or African American [2] male with  has a past medical history of Chicken pox, Epigastric pain (04/05/2014), Essential hypertension (05/02/2016), Gout, Headache disorder (2015), Migraines, and PUD (peptic ulcer disease) (03/14/2014). ? ?Essential hypertension ?BP Readings from Last 3 Encounters:  ?07/09/21 130/84  ?02/21/21 126/80  ?01/11/21 138/88  ? ?Stable, pt to continue medical treatment losartan, norvasc ? ? ?Prediabetes ?Lab Results  ?Component Value Date  ? HGBA1C 6.0 08/31/2020  ? ?Stable, pt to continue current medical treatment  - diet ? ? ?Leg pain, bilateral ?C/w overuse, for nsaid prn and muscle relaxer prn, and work given to limit overuse ? ?Urinary urgency ?Exam benign, but for UA r/o infection ? ?Followup: Return in about 3 months (around 10/09/2021), or to Dr Yetta Barre. ? ?Oliver Barre, MD 07/14/2021 10:46 AM ?[facility] ?[facility] ?Internal Medicine ?

## 2021-07-09 NOTE — Patient Instructions (Signed)
Please take all new medication as prescribed - the anti-inflammatory and the muscle relaxer for the leg pain ? ?You are given the work note ? ?Please continue all other medications as before, and refills have been done if requested. ? ?Please have the pharmacy call with any other refills you may need. ? ?Please keep your appointments with your specialists as you may have planned ? ?Please go to the LAB at the blood drawing area for the tests to be done-  - just the urine test today ? ?You will be contacted by phone if any changes need to be made immediately.  Otherwise, you will receive a letter about your results with an explanation, but please check with MyChart first. ? ?Please remember to sign up for MyChart if you have not done so, as this will be important to you in the future with finding out test results, communicating by private email, and scheduling acute appointments online when needed. ? ?Please see Dr Ronnald Ramp in 3 months for follow up ? ? ? ? ?

## 2021-07-10 LAB — URINE CULTURE: Result:: NO GROWTH

## 2021-07-14 ENCOUNTER — Encounter: Payer: Self-pay | Admitting: Internal Medicine

## 2021-07-14 NOTE — Assessment & Plan Note (Signed)
BP Readings from Last 3 Encounters:  ?07/09/21 130/84  ?02/21/21 126/80  ?01/11/21 138/88  ? ?Stable, pt to continue medical treatment losartan, norvasc ? ?

## 2021-07-14 NOTE — Assessment & Plan Note (Signed)
Lab Results  ?Component Value Date  ? HGBA1C 6.0 08/31/2020  ? ?Stable, pt to continue current medical treatment  - diet ? ?

## 2021-07-14 NOTE — Assessment & Plan Note (Signed)
C/w overuse, for nsaid prn and muscle relaxer prn, and work given to limit overuse ?

## 2021-07-14 NOTE — Assessment & Plan Note (Signed)
Exam benign, but for UA r/o infection ?

## 2021-08-15 ENCOUNTER — Ambulatory Visit: Payer: Commercial Managed Care - HMO | Admitting: Internal Medicine

## 2021-08-19 ENCOUNTER — Other Ambulatory Visit: Payer: Self-pay | Admitting: Internal Medicine

## 2021-08-19 DIAGNOSIS — K219 Gastro-esophageal reflux disease without esophagitis: Secondary | ICD-10-CM

## 2021-08-20 MED ORDER — PANTOPRAZOLE SODIUM 40 MG PO TBEC: 40.0000 mg | DELAYED_RELEASE_TABLET | Freq: Every day | ORAL | 0 refills | Status: DC

## 2021-10-08 ENCOUNTER — Other Ambulatory Visit: Payer: Self-pay | Admitting: Internal Medicine

## 2021-10-08 NOTE — Telephone Encounter (Signed)
Ok to PCP please 

## 2021-10-29 ENCOUNTER — Other Ambulatory Visit: Payer: Self-pay | Admitting: Internal Medicine

## 2021-10-29 DIAGNOSIS — K219 Gastro-esophageal reflux disease without esophagitis: Secondary | ICD-10-CM

## 2021-11-08 ENCOUNTER — Ambulatory Visit: Payer: Commercial Managed Care - HMO | Admitting: Family Medicine

## 2021-12-27 ENCOUNTER — Other Ambulatory Visit: Payer: Self-pay | Admitting: Internal Medicine

## 2022-02-10 ENCOUNTER — Other Ambulatory Visit: Payer: Self-pay | Admitting: Internal Medicine

## 2022-02-10 DIAGNOSIS — K219 Gastro-esophageal reflux disease without esophagitis: Secondary | ICD-10-CM

## 2022-10-02 ENCOUNTER — Encounter (INDEPENDENT_AMBULATORY_CARE_PROVIDER_SITE_OTHER): Payer: Self-pay

## 2022-10-15 ENCOUNTER — Encounter: Payer: Self-pay | Admitting: Internal Medicine

## 2022-10-15 ENCOUNTER — Ambulatory Visit (INDEPENDENT_AMBULATORY_CARE_PROVIDER_SITE_OTHER): Payer: Self-pay | Admitting: Internal Medicine

## 2022-10-15 VITALS — BP 152/104 | HR 81 | Temp 98.5°F | Resp 16 | Ht 67.0 in | Wt 197.0 lb

## 2022-10-15 DIAGNOSIS — Z8249 Family history of ischemic heart disease and other diseases of the circulatory system: Secondary | ICD-10-CM

## 2022-10-15 DIAGNOSIS — E785 Hyperlipidemia, unspecified: Secondary | ICD-10-CM

## 2022-10-15 DIAGNOSIS — E1122 Type 2 diabetes mellitus with diabetic chronic kidney disease: Secondary | ICD-10-CM | POA: Insufficient documentation

## 2022-10-15 DIAGNOSIS — N400 Enlarged prostate without lower urinary tract symptoms: Secondary | ICD-10-CM

## 2022-10-15 DIAGNOSIS — M1A00X0 Idiopathic chronic gout, unspecified site, without tophus (tophi): Secondary | ICD-10-CM | POA: Insufficient documentation

## 2022-10-15 DIAGNOSIS — K219 Gastro-esophageal reflux disease without esophagitis: Secondary | ICD-10-CM

## 2022-10-15 DIAGNOSIS — I10 Essential (primary) hypertension: Secondary | ICD-10-CM

## 2022-10-15 DIAGNOSIS — E781 Pure hyperglyceridemia: Secondary | ICD-10-CM

## 2022-10-15 NOTE — Patient Instructions (Signed)
Hypertension, Adult High blood pressure (hypertension) is when the force of blood pumping through the arteries is too strong. The arteries are the blood vessels that carry blood from the heart throughout the body. Hypertension forces the heart to work harder to pump blood and may cause arteries to become narrow or stiff. Untreated or uncontrolled hypertension can lead to a heart attack, heart failure, a stroke, kidney disease, and other problems. A blood pressure reading consists of a higher number over a lower number. Ideally, your blood pressure should be below 120/80. The first ("top") number is called the systolic pressure. It is a measure of the pressure in your arteries as your heart beats. The second ("bottom") number is called the diastolic pressure. It is a measure of the pressure in your arteries as the heart relaxes. What are the causes? The exact cause of this condition is not known. There are some conditions that result in high blood pressure. What increases the risk? Certain factors may make you more likely to develop high blood pressure. Some of these risk factors are under your control, including: Smoking. Not getting enough exercise or physical activity. Being overweight. Having too much fat, sugar, calories, or salt (sodium) in your diet. Drinking too much alcohol. Other risk factors include: Having a personal history of heart disease, diabetes, high cholesterol, or kidney disease. Stress. Having a family history of high blood pressure and high cholesterol. Having obstructive sleep apnea. Age. The risk increases with age. What are the signs or symptoms? High blood pressure may not cause symptoms. Very high blood pressure (hypertensive crisis) may cause: Headache. Fast or irregular heartbeats (palpitations). Shortness of breath. Nosebleed. Nausea and vomiting. Vision changes. Severe chest pain, dizziness, and seizures. How is this diagnosed? This condition is diagnosed by  measuring your blood pressure while you are seated, with your arm resting on a flat surface, your legs uncrossed, and your feet flat on the floor. The cuff of the blood pressure monitor will be placed directly against the skin of your upper arm at the level of your heart. Blood pressure should be measured at least twice using the same arm. Certain conditions can cause a difference in blood pressure between your right and left arms. If you have a high blood pressure reading during one visit or you have normal blood pressure with other risk factors, you may be asked to: Return on a different day to have your blood pressure checked again. Monitor your blood pressure at home for 1 week or longer. If you are diagnosed with hypertension, you may have other blood or imaging tests to help your health care provider understand your overall risk for other conditions. How is this treated? This condition is treated by making healthy lifestyle changes, such as eating healthy foods, exercising more, and reducing your alcohol intake. You may be referred for counseling on a healthy diet and physical activity. Your health care provider may prescribe medicine if lifestyle changes are not enough to get your blood pressure under control and if: Your systolic blood pressure is above 130. Your diastolic blood pressure is above 80. Your personal target blood pressure may vary depending on your medical conditions, your age, and other factors. Follow these instructions at home: Eating and drinking  Eat a diet that is high in fiber and potassium, and low in sodium, added sugar, and fat. An example of this eating plan is called the DASH diet. DASH stands for Dietary Approaches to Stop Hypertension. To eat this way: Eat   plenty of fresh fruits and vegetables. Try to fill one half of your plate at each meal with fruits and vegetables. Eat whole grains, such as whole-wheat pasta, brown rice, or whole-grain bread. Fill about one  fourth of your plate with whole grains. Eat or drink low-fat dairy products, such as skim milk or low-fat yogurt. Avoid fatty cuts of meat, processed or cured meats, and poultry with skin. Fill about one fourth of your plate with lean proteins, such as fish, chicken without skin, beans, eggs, or tofu. Avoid pre-made and processed foods. These tend to be higher in sodium, added sugar, and fat. Reduce your daily sodium intake. Many people with hypertension should eat less than 1,500 mg of sodium a day. Do not drink alcohol if: Your health care provider tells you not to drink. You are pregnant, may be pregnant, or are planning to become pregnant. If you drink alcohol: Limit how much you have to: 0-1 drink a day for women. 0-2 drinks a day for men. Know how much alcohol is in your drink. In the [place] one drink equals one 12 oz bottle of beer (355 mL), one 5 oz glass of wine (148 mL), or one 1 oz glass of hard liquor (44 mL). Lifestyle  Work with your health care provider to maintain a healthy body weight or to lose weight. Ask what an ideal weight is for you. Get at least 30 minutes of exercise that causes your heart to beat faster (aerobic exercise) most days of the week. Activities may include walking, swimming, or biking. Include exercise to strengthen your muscles (resistance exercise), such as Pilates or lifting weights, as part of your weekly exercise routine. Try to do these types of exercises for 30 minutes at least 3 days a week. Do not use any products that contain nicotine or tobacco. These products include cigarettes, chewing tobacco, and vaping devices, such as e-cigarettes. If you need help quitting, ask your health care provider. Monitor your blood pressure at home as told by your health care provider. Keep all follow-up visits. This is important. Medicines Take over-the-counter and prescription medicines only as told by your health care provider. Follow directions carefully. Blood  pressure medicines must be taken as prescribed. Do not skip doses of blood pressure medicine. Doing this puts you at risk for problems and can make the medicine less effective. Ask your health care provider about side effects or reactions to medicines that you should watch for. Contact a health care provider if you: Think you are having a reaction to a medicine you are taking. Have headaches that keep coming back (recurring). Feel dizzy. Have swelling in your ankles. Have trouble with your vision. Get help right away if you: Develop a severe headache or confusion. Have unusual weakness or numbness. Feel faint. Have severe pain in your chest or abdomen. Vomit repeatedly. Have trouble breathing. These symptoms may be an emergency. Get help right away. Call 911. Do not wait to see if the symptoms will go away. Do not drive yourself to the hospital. Summary Hypertension is when the force of blood pumping through your arteries is too strong. If this condition is not controlled, it may put you at risk for serious complications. Your personal target blood pressure may vary depending on your medical conditions, your age, and other factors. For most people, a normal blood pressure is less than 120/80. Hypertension is treated with lifestyle changes, medicines, or a combination of both. Lifestyle changes include losing weight, eating a healthy,   low-sodium diet, exercising more, and limiting alcohol. This information is not intended to replace advice given to you by your health care provider. Make sure you discuss any questions you have with your health care provider. Document Revised: 12/26/2020 Document Reviewed: 12/26/2020 Elsevier Patient Education  2024 [employer] Inc.  

## 2022-10-15 NOTE — Progress Notes (Signed)
Subjective:  Patient ID: Anthony Gill, male    DOB: August 26, 1970  Age: 52 y.o. MRN: [id number]  CC: Hypertension, Gastroesophageal Reflux, Hyperlipidemia, and Diabetes   HPI Anthony Gill presents for a CPX and f/up ----  Discussed the use of AI scribe software for clinical note transcription with the patient, who gave verbal consent to proceed.  History of Present Illness   The patient, with a history of glaucoma, gout, and hypertension, presents for a routine check-up. They deny any chest pain, shortness of breath, headache, blurred vision, or symptoms of diabetes. They underwent successful glaucoma surgery in August of the previous year, which has significantly improved their vision. They are currently under the care of a glaucoma specialist.  The patient is on allopurinol and pantoprazole, and denies taking any pain medications. They monitor their blood pressure at home, which has been slightly elevated with readings around 120-125/90-99. They deny any associated symptoms such as headaches, blurred vision, or swelling. The patient admits to smoking one Black & Mild every two days.  They report experiencing gout pain in their ankle and arm, which can last for several days. They have previously taken prednisone for this issue. The patient also expresses concern about cancer, as there is a significant family history of various types, including bone, pancreatic, colon, and prostate cancer.  The patient denies any abdominal pain, heartburn, indigestion, or blood in their stool. They report frequent urination but deny any associated pain or difficulty. 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       Outpatient Medications Prior to Visit  Medication Sig Dispense Refill   cyclobenzaprine (FLEXERIL) 5 MG tablet Take 1 tablet by mouth three times daily as needed for muscle spasm 60 tablet 0   diclofenac (VOLTAREN) 50 MG EC tablet Take 50 mg by mouth 3 (three) times  daily.     meloxicam (MOBIC) 15 MG tablet TAKE 1 TABLET BY MOUTH ONCE DAILY AS NEEDED FOR PAIN 90 tablet 0   pantoprazole (PROTONIX) 40 MG tablet Take 1 tablet by mouth once daily 90 tablet 0   allopurinol (ZYLOPRIM) 100 MG tablet Take 2 tablets by mouth once daily 180 tablet 0   amLODipine (NORVASC) 5 MG tablet Take 1 tablet by mouth once daily 90 tablet 0   fenofibrate (TRICOR) 145 MG tablet Take 1 tablet (145 mg total) by mouth daily. 90 tablet 1   losartan (COZAAR) 100 MG tablet Take 1 tablet (100 mg total) by mouth daily. 90 tablet 3   omega-3 acid ethyl esters (LOVAZA) 1 g capsule Take 2 capsules (2 g total) by mouth 2 (two) times daily. 360 capsule 1   potassium chloride SA (KLOR-CON) 20 MEQ tablet Take 1 tablet (20 mEq total) by mouth 2 (two) times daily. 180 tablet 1   predniSONE (DELTASONE) 20 MG tablet Take 2 tablets (40 mg total) by mouth daily with breakfast. 10 tablet 0   No facility-administered medications prior to visit.    ROS Review of Systems  Constitutional:  Negative for chills, diaphoresis, fatigue and fever.  HENT: Negative.    Eyes: Negative.   Respiratory: Negative.  Negative for apnea, cough, shortness of breath and wheezing.   Cardiovascular:  Negative for chest pain, palpitations and leg swelling.  Gastrointestinal:  Negative for abdominal pain, constipation, diarrhea, nausea and vomiting.  Endocrine: Negative.   Genitourinary: Negative.  Negative for difficulty urinating, penile swelling and scrotal swelling.  Musculoskeletal: Negative.   Skin: Negative.  Neurological: Negative.  Negative for dizziness.  Hematological:  Negative for adenopathy. Does not bruise/bleed easily.  Psychiatric/Behavioral: Negative.      Objective:  BP (!) 152/104 (BP Location: Right Arm, Patient Position: Sitting, Cuff Size: Large)   Pulse 81   Temp 98.5 F (36.9 C) (Oral)   Resp 16   Ht 5\' 7"  (1.702 m)   Wt 197 lb (89.4 kg)   SpO2 96%   BMI 30.85 kg/m   BP Readings  from Last 3 Encounters:  10/15/22 (!) 152/104  07/09/21 130/84  02/21/21 126/80    Wt Readings from Last 3 Encounters:  10/15/22 197 lb (89.4 kg)  07/09/21 203 lb (92.1 kg)  02/21/21 207 lb 9.6 oz (94.2 kg)    Physical Exam Vitals reviewed.  Constitutional:      Appearance: Normal appearance.  HENT:     Nose: Nose normal.     Mouth/Throat:     Mouth: Mucous membranes are moist.  Eyes:     General: No scleral icterus.    Conjunctiva/sclera: Conjunctivae normal.  Cardiovascular:     Rate and Rhythm: Normal rate and regular rhythm.     Heart sounds: Normal heart sounds, S1 normal and S2 normal. No murmur heard.    Comments: EKG- NSR, 83 bpm No LVH, Q waves, or ST/T waves  Pulmonary:     Effort: Pulmonary effort is normal.     Breath sounds: No stridor. No wheezing, rhonchi or rales.  Abdominal:     General: Abdomen is protuberant. Bowel sounds are normal. There is no distension.     Palpations: Abdomen is soft. There is no hepatomegaly, splenomegaly or mass.     Tenderness: There is no abdominal tenderness.     Hernia: There is no hernia in the left inguinal area or right inguinal area.  Genitourinary:    Pubic Area: No rash.      Penis: Normal and circumcised.      Testes: Normal.     Epididymis:     Right: Normal.     Left: Normal.     Prostate: Enlarged. Not tender and no nodules present.     Rectum: Normal. Guaiac result negative. No mass, tenderness, anal fissure, external hemorrhoid or internal hemorrhoid. Normal anal tone.  Musculoskeletal:        General: Normal range of motion.     Cervical back: Neck supple.     Right lower leg: No edema.     Left lower leg: No edema.  Lymphadenopathy:     Cervical: No cervical adenopathy.     Lower Body: No right inguinal adenopathy. No left inguinal adenopathy.  Skin:    General: Skin is warm and dry.  Neurological:     General: No focal deficit present.     Mental Status: He is alert. Mental status is at baseline.   Psychiatric:        Mood and Affect: Mood normal.        Behavior: Behavior normal.     Lab Results  Component Value Date   WBC 5.7 10/15/2022   HGB 14.4 10/15/2022   HCT 42.2 10/15/2022   PLT 214.0 10/15/2022   GLUCOSE 95 10/15/2022   CHOL 148 10/15/2022   TRIG (H) 10/15/2022    617.0 Triglyceride is over 400; calculations on Lipids are invalid.   HDL 33.00 (L) 10/15/2022   LDLDIRECT 43.0 10/15/2022   LDLCALC 42 06/09/2013   ALT 18 10/15/2022   AST 20 10/15/2022  NA 137 10/15/2022   K 3.7 10/15/2022   CL 100 10/15/2022   CREATININE 1.05 10/15/2022   BUN 8 10/15/2022   CO2 27 10/15/2022   TSH 2.48 10/15/2022   PSA 0.55 10/15/2022   HGBA1C 5.8 10/15/2022   MICROALBUR <0.7 10/15/2022    No results found.  Assessment & Plan:   Essential hypertension- He has not achieved his blood pressure goal.  Will start Dyazide. -     TSH; Future -     Urinalysis, Routine w reflex microscopic; Future -     Hepatic function panel; Future -     CBC with Differential/Platelet; Future -     Basic metabolic panel; Future -     EKG 12-Lead -     Triamterene-HCTZ; Take 1 each (1 capsule total) by mouth daily.  Dispense: 90 capsule; Refill: 0  Family history of early CAD- Will evaluate for CAD with a CCS and will start a statin. -     Lipid panel; Future -     Lipoprotein A (LPA); Future -     CT CARDIAC SCORING (SELF PAY ONLY); Future -     Rosuvastatin Calcium; Take 1 tablet (5 mg total) by mouth daily.  Dispense: 90 tablet; Refill: 0  Gastroesophageal reflux disease without esophagitis  Primary hypertriglyceridemia -     Hepatic function panel; Future -     Omega-3-acid Ethyl Esters; Take 2 capsules (2 g total) by mouth 2 (two) times daily.  Dispense: 360 capsule; Refill: 0  Idiopathic chronic gout without tophus, unspecified site -     Basic metabolic panel; Future -     Uric acid; Future -     Allopurinol; Take 1 tablet (100 mg total) by mouth daily.  Dispense: 90 tablet;  Refill: 0  Type 2 diabetes mellitus with chronic kidney disease, without long-term current use of insulin, unspecified CKD stage (HCC)- His blood sugar is very well-controlled. -     Microalbumin / creatinine urine ratio; Future -     Urinalysis, Routine w reflex microscopic; Future -     Hemoglobin A1c; Future -     Basic metabolic panel; Future -     HM Diabetes Foot Exam  Dyslipidemia, goal LDL below 100 -     TSH; Future -     Hepatic function panel; Future -     CT CARDIAC SCORING (SELF PAY ONLY); Future -     Rosuvastatin Calcium; Take 1 tablet (5 mg total) by mouth daily.  Dispense: 90 tablet; Refill: 0  Benign prostatic hyperplasia without lower urinary tract symptoms -     PSA; Future -     Urinalysis, Routine w reflex microscopic; Future  Other orders -     LDL cholesterol, direct     Follow-up: Return in about 3 months (around 01/15/2023).  Sanda Linger, MD

## 2022-10-16 DIAGNOSIS — N400 Enlarged prostate without lower urinary tract symptoms: Secondary | ICD-10-CM | POA: Insufficient documentation

## 2022-10-16 MED ORDER — OMEGA-3-ACID ETHYL ESTERS 1 G PO CAPS: 2.0000 g | ORAL_CAPSULE | Freq: Two times a day (BID) | ORAL | 0 refills | Status: DC

## 2022-10-16 MED ORDER — TRIAMTERENE-HCTZ 37.5-25 MG PO CAPS
1.0000 | ORAL_CAPSULE | Freq: Every day | ORAL | 0 refills | Status: DC
Start: 2022-10-16 — End: 2023-05-20

## 2022-10-16 MED ORDER — ROSUVASTATIN CALCIUM 5 MG PO TABS: 5.0000 mg | ORAL_TABLET | Freq: Every day | ORAL | 0 refills | Status: DC

## 2022-10-16 MED ORDER — ALLOPURINOL 100 MG PO TABS
100.0000 mg | ORAL_TABLET | Freq: Every day | ORAL | 0 refills | Status: DC
Start: 2022-10-16 — End: 2023-01-09

## 2022-11-05 ENCOUNTER — Ambulatory Visit (INDEPENDENT_AMBULATORY_CARE_PROVIDER_SITE_OTHER): Payer: 59 | Admitting: Internal Medicine

## 2022-11-05 ENCOUNTER — Encounter: Payer: Self-pay | Admitting: Internal Medicine

## 2022-11-05 VITALS — BP 130/98 | HR 76 | Temp 98.5°F | Wt 195.2 lb

## 2022-11-05 DIAGNOSIS — M545 Low back pain, unspecified: Secondary | ICD-10-CM | POA: Diagnosis not present

## 2022-11-05 MED ORDER — PREDNISONE 20 MG PO TABS: 40.0000 mg | ORAL_TABLET | Freq: Every day | ORAL | 0 refills | Status: AC

## 2022-11-05 NOTE — Patient Instructions (Addendum)
       Medications changes include :  prednisone 40 mg daily for 5 days        Return if symptoms worsen or fail to improve and we can refer you to sports medicine.

## 2022-11-05 NOTE — Progress Notes (Signed)
Subjective:    Patient ID: Anthony Gill, male    DOB: 02-17-1971, 52 y.o.   MRN: [id number]      HPI Anthony Gill is here for  Chief Complaint  Patient presents with   Back Pain    Back pain, started x2 weeks ago, lower back, Pt states more of a burning sensation than a painful sensation.      Back pain x 2 weeks - no injury.  The pain is across his lower back.  In the morning it is severe pain - the pain becomes a burning pain when he sits.  When he gets up it goes back to severe pain, but less burning.  When he sleeps the pain resolves.  No radiating pain in to legs.  No N/T.  No prior episodes like this - he had some pain in his lower back but never this burning pain.  He did try heat, but not really want to keep using that because he already had the burning pain.    Medications and allergies reviewed with patient and updated if appropriate.  Current Outpatient Medications on File Prior to Visit  Medication Sig Dispense Refill   allopurinol (ZYLOPRIM) 100 MG tablet Take 1 tablet (100 mg total) by mouth daily. 90 tablet 0   omega-3 acid ethyl esters (LOVAZA) 1 g capsule Take 2 capsules (2 g total) by mouth 2 (two) times daily. 360 capsule 0   pantoprazole (PROTONIX) 40 MG tablet Take 1 tablet by mouth once daily 90 tablet 0   rosuvastatin (CRESTOR) 5 MG tablet Take 1 tablet (5 mg total) by mouth daily. 90 tablet 0   triamterene-hydrochlorothiazide (DYAZIDE) 37.5-25 MG capsule Take 1 each (1 capsule total) by mouth daily. 90 capsule 0   cyclobenzaprine (FLEXERIL) 5 MG tablet Take 1 tablet by mouth three times daily as needed for muscle spasm (Patient not taking: Reported on 11/05/2022) 60 tablet 0   diclofenac (VOLTAREN) 50 MG EC tablet Take 50 mg by mouth 3 (three) times daily. (Patient not taking: Reported on 11/05/2022)     meloxicam (MOBIC) 15 MG tablet TAKE 1 TABLET BY MOUTH ONCE DAILY AS NEEDED FOR PAIN (Patient not taking: Reported on 11/05/2022) 90 tablet 0   No current  facility-administered medications on file prior to visit.    Review of Systems     Objective:   Vitals:   11/05/22 1435  BP: (!) 130/98  Pulse: 76  Temp: 98.5 F (36.9 C)  SpO2: 98%   BP Readings from Last 3 Encounters:  11/05/22 (!) 130/98  10/15/22 (!) 152/104  07/09/21 130/84   Wt Readings from Last 3 Encounters:  11/05/22 195 lb 3.2 oz (88.5 kg)  10/15/22 197 lb (89.4 kg)  07/09/21 203 lb (92.1 kg)   Body mass index is 30.57 kg/m.    Physical Exam Constitutional:      General: He is not in acute distress.    Appearance: Normal appearance. He is not ill-appearing.  HENT:     Head: Normocephalic and atraumatic.  Musculoskeletal:        General: Tenderness (Positive tenderness paravertebral muscles bilaterally) present. No swelling or deformity.     Right lower leg: No edema.     Left lower leg: No edema.  Skin:    General: Skin is warm and dry.     Findings: No erythema or rash.  Neurological:     Mental Status: He is alert.     Sensory: No sensory deficit (  Bilateral lower extremities with normal sensation).     Motor: No weakness (Bilateral lower extremities with normal strength).     Gait: Gait normal.            Assessment & Plan:    Bilateral lower back pain Acute-started 2 weeks ago without injury Possibly musculoskeletal versus arthritis or spondylolisthesis No rash or evidence of shingles Prednisone 40 mg daily x 5 days Discussed he can try ice If no improvement or recurrence consider sports medicine referral and imaging

## 2022-11-14 ENCOUNTER — Telehealth: Payer: Self-pay

## 2022-11-14 ENCOUNTER — Other Ambulatory Visit: Payer: Self-pay | Admitting: Pharmacist

## 2022-11-14 NOTE — Telephone Encounter (Signed)
-----   Message from Perley Jain sent at 11/14/2022  2:16 PM EDT ----- Contacted patient for [employer] - Hypertension Control outreach due to last documented ambulatory blood pressure of 130/98 on 11/05/2022. Next appointment with PCP is not scheduled     Current antihypertensives: triamterene/hydrochlorothiazide 37.5-25 mg daily -started 10/15/22 (amlodipine and losartan stopped 10/15/22 appt)   Current home blood pressure readings: 130-140/95-98   Pt also reports continued back pain, he describes as burning. He saw Dr. Lawerance Bach 11/05/22, was given prednisone, which helped however once he stopped prednisone, the pain returned.    - Consider addition of losartan or amlodipine for continued uncontrolled BP  - Forwarding to PCP/CMA to inquire about pt requesting follow up for ongoing back pain

## 2022-11-14 NOTE — Progress Notes (Signed)
Patient ID: Anthony Gill, male   DOB: [birth year], 52 y.o.   MRN: [id number]  Patient appearing on report for [employer] - Hypertension Control report due to last documented ambulatory blood pressure of 130/98 on 11/05/2022. Next appointment with PCP is not scheduled   Outreached patient to discuss hypertension control and medication management.   Current antihypertensives: triamterene/hydrochlorothiazide 37.5-25 mg daily -started 10/15/22 (amlodipine and losartan stopped 10/15/22 appt)  Patient has an automated upper arm home BP machine.  Current blood pressure readings: 130-140/95-98  Pt also reports continued back pain, he describes as burning. He saw Dr. Lawerance Bach 11/05/22, was given prednisone, which helped however once he stopped prednisone, the pain returned. He states he called yesterday to see what follow up is needed.    Assessment/Plan: - Currently uncontrolled, BP goal <130/80. Improved from 150/100s. Consider addition of losartan or amlodipine. - Forwarding to PCP/CMA to inquire about pt requesting follow up for ongoing back pain  Arbutus Leas, PharmD, BCPS [facility] [phone]

## 2022-11-14 NOTE — Telephone Encounter (Signed)
Called pt, LVM to schedule OV for next week to discuss ongoing back pain.

## 2022-11-15 ENCOUNTER — Ambulatory Visit: Payer: 59 | Admitting: Adult Health

## 2022-11-19 ENCOUNTER — Encounter: Payer: Self-pay | Admitting: Adult Health

## 2022-11-19 ENCOUNTER — Ambulatory Visit (INDEPENDENT_AMBULATORY_CARE_PROVIDER_SITE_OTHER): Payer: 59 | Admitting: Adult Health

## 2022-11-19 VITALS — BP 136/82 | HR 91 | Temp 98.3°F | Ht 67.0 in | Wt 198.1 lb

## 2022-11-19 DIAGNOSIS — M545 Low back pain, unspecified: Secondary | ICD-10-CM | POA: Diagnosis not present

## 2022-11-19 DIAGNOSIS — M7661 Achilles tendinitis, right leg: Secondary | ICD-10-CM

## 2022-11-19 MED ORDER — CYCLOBENZAPRINE HCL 10 MG PO TABS: 10.0000 mg | ORAL_TABLET | Freq: Every day | ORAL | 0 refills | Status: DC

## 2022-11-19 MED ORDER — NAPROXEN 500 MG PO TABS: 500.0000 mg | ORAL_TABLET | Freq: Two times a day (BID) | ORAL | 0 refills | Status: DC

## 2022-11-19 NOTE — Progress Notes (Signed)
Subjective:    Patient ID: Anthony Gill, male    DOB: [birth year], 52 y.o.   MRN: [id number]  Back Pain    52 year old male who  has a past medical history of Chicken pox, Epigastric pain (04/05/2014), Essential hypertension (05/02/2016), Gout, Headache disorder (2015), Migraines, and PUD (peptic ulcer disease) (03/14/2014).  He presents to the office today for an acute issue.  He reports that over the last 3 weeks he has been experiencing right lower back pain and pain in his right heel.  He was seen by another primary care provider and prescribed a 5-day course of steroids.  He reports that this helped alleviate the pain until he finished the steroids and the pain came back.  Pain in his low back is not every day and when he does have pain it does not necessarily last all day.  In the morning when he wakes up he feels as though he needs to stretch and the pain is described more as a stiffness.  Throughout the day the pain turns into a burning sensation and is worse with sitting, standing, and walking.  He never has any pain with laying down.  Is any radiating pain down his right leg.  Complains of pain in the back of his right heel.  This has been present for about as long as the low back pain has been present.  And this pain is not daily and does not last all day necessarily when he does have pain.  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  He does wear steel toe shoes  Review of Systems  Musculoskeletal:  Positive for back pain.   See HPI   Past Medical History:  Diagnosis Date   Chicken pox    Epigastric pain 04/05/2014   Essential hypertension 05/02/2016   Gout    Headache disorder 2015   Tension w/medication overuse component suspected by neurologist (Dr. Everlena Cooper).  MRI brain ordered.   Migraines    PUD (peptic ulcer disease) 03/14/2014    Social History   Socioeconomic History   Marital status: Single    Spouse name: Not on file   Number of children: Not on file    Years of education: Not on file   Highest education level: Not on file  Occupational History   Not on file  Tobacco Use   Smoking status: Former    Current packs/day: 0.00    Types: Cigarettes    Start date: 03/05/2011    Quit date: 03/04/2014    Years since quitting: 8.7   Smokeless tobacco: Never  Vaping Use   Vaping status: Never Used  Substance and Sexual Activity   Alcohol use: Yes    Alcohol/week: 3.0 standard drinks of alcohol    Types: 3 Cans of beer per week    Comment: social   Drug use: No    Comment: former user   Sexual activity: Yes    Partners: Female    Birth control/protection: None    Comment: married  Other Topics Concern   Not on file  Social History Narrative   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   Grew up in [place]   Married   1 son   Social Determinants of Health   Financial Resource Strain: Not on file  Food Insecurity: Not on file  Transportation Needs: Not on file  Physical Activity: Not on file  Stress: Not on file  Social Connections: Not on file  Intimate Partner Violence: Not on file    Past Surgical History:  Procedure Laterality Date   COLONOSCOPY N/A 04/10/2014   Procedure: COLONOSCOPY;  Surgeon: Barrie Folk, MD;  Location: [facility] ENDOSCOPY;  Service: Endoscopy;  Laterality: N/A;   ESOPHAGOGASTRODUODENOSCOPY N/A 04/10/2014   Procedure: ESOPHAGOGASTRODUODENOSCOPY (EGD);  Surgeon: Barrie Folk, MD;  Location: [facility] ENDOSCOPY;  Service: Endoscopy;  Laterality: N/A;   HERNIA REPAIR  2006   WISDOM TOOTH EXTRACTION      Family History  Problem Relation Age of Onset   Diabetes Mother    Hyperlipidemia Mother    Prostate cancer Father        family history   Heart disease Father    Heart attack Father 35       Deceased   Hyperlipidemia Father    Diabetes Sister        x3   Hyperlipidemia Sister    Diabetes Brother        x1   Hyperlipidemia Brother        x1   Heart disease Paternal Uncle    Heart attack Paternal  Uncle    Hypertension Other        family history   Cancer Other        Aunts & Uncles   Cancer Other        Grandparents    Allergies  Allergen Reactions   Ciprofloxacin Itching   Colchicine     hives   Doxycycline     Stomach upset   Indapamide     Gout, cramps    Current Outpatient Medications on File Prior to Visit  Medication Sig Dispense Refill   allopurinol (ZYLOPRIM) 100 MG tablet Take 1 tablet (100 mg total) by mouth daily. 90 tablet 0   omega-3 acid ethyl esters (LOVAZA) 1 g capsule Take 2 capsules (2 g total) by mouth 2 (two) times daily. 360 capsule 0   pantoprazole (PROTONIX) 40 MG tablet Take 1 tablet by mouth once daily 90 tablet 0   rosuvastatin (CRESTOR) 5 MG tablet Take 1 tablet (5 mg total) by mouth daily. 90 tablet 0   triamterene-hydrochlorothiazide (DYAZIDE) 37.5-25 MG capsule Take 1 each (1 capsule total) by mouth daily. 90 capsule 0   No current facility-administered medications on file prior to visit.    BP 136/82 (BP Location: Left Arm, Patient Position: Sitting, Cuff Size: Large)   Pulse 91   Temp 98.3 F (36.8 C) (Oral)   Ht 5\' 7"  (1.702 m)   Wt 198 lb 2 oz (89.9 kg)   SpO2 97%   BMI 31.03 kg/m       Objective:   Physical Exam Vitals and nursing note reviewed.  Constitutional:      Appearance: Normal appearance.  Musculoskeletal:       Back:     Right ankle:     Right Achilles Tendon: Tenderness present. No defects. Thompson's test negative.  Skin:    General: Skin is warm and dry.  Neurological:     General: No focal deficit present.     Mental Status: He is alert and oriented to person, place, and time.  Psychiatric:        Mood and Affect: Mood normal.        Behavior: Behavior normal.        Thought Content: Thought content normal.        Judgment: Judgment normal.  Assessment & Plan:  1. Acute right-sided low back pain without sciatica -This does seem to be muscular in origin.  Will prescribe Flexeril 10 mg to  take before bedtime.  If not improving in the next 3 to 4 days then he will return for an x-ray.  We had a discussion about how standing for long periods of time can affect both his low back and Achilles tendon.  He was shown some good insoles that he can get on [place] which he plans on trying that should help correct his low back pain and Achilles tendinitis.   - If he does not have any improvement we can also refer to sports medicine or physical therapy - cyclobenzaprine (FLEXERIL) 10 MG tablet; Take 1 tablet (10 mg total) by mouth at bedtime.  Dispense: 15 tablet; Refill: 0 - DG Lumbar Spine Complete; Future  2. Achilles tendinitis of right lower extremity  - naproxen (NAPROSYN) 500 MG tablet; Take 1 tablet (500 mg total) by mouth 2 (two) times daily with a meal.  Dispense: 30 tablet; Refill: 0  Shirline Frees, NP

## 2022-11-22 DIAGNOSIS — H40053 Ocular hypertension, bilateral: Secondary | ICD-10-CM | POA: Diagnosis not present

## 2022-12-02 ENCOUNTER — Telehealth: Payer: Self-pay | Admitting: Internal Medicine

## 2022-12-02 DIAGNOSIS — K219 Gastro-esophageal reflux disease without esophagitis: Secondary | ICD-10-CM

## 2022-12-02 NOTE — Telephone Encounter (Signed)
Prescription Request  12/02/2022  LOV: 10/15/2022  What is the name of the medication or equipment? pantoprazole  Have you contacted your pharmacy to request a refill? Yes   Which pharmacy would you like this sent to?  [facility] [address], [place] - [address] [place] [address] Phone: [phone] Fax: [phone]    Patient notified that their request is being sent to the clinical staff for review and that they should receive a response within 2 business days.   Please advise at Mobile [phone] (mobile)

## 2022-12-03 MED ORDER — PANTOPRAZOLE SODIUM 40 MG PO TBEC
40.0000 mg | DELAYED_RELEASE_TABLET | Freq: Every day | ORAL | 0 refills | Status: DC
Start: 2022-12-03 — End: 2023-02-25

## 2022-12-04 ENCOUNTER — Other Ambulatory Visit: Payer: Self-pay | Admitting: Adult Health

## 2022-12-04 ENCOUNTER — Other Ambulatory Visit: Payer: Self-pay | Admitting: Internal Medicine

## 2022-12-04 DIAGNOSIS — M7661 Achilles tendinitis, right leg: Secondary | ICD-10-CM

## 2022-12-04 DIAGNOSIS — M545 Low back pain, unspecified: Secondary | ICD-10-CM

## 2022-12-20 ENCOUNTER — Other Ambulatory Visit: Payer: Self-pay | Admitting: Adult Health

## 2022-12-20 DIAGNOSIS — M7661 Achilles tendinitis, right leg: Secondary | ICD-10-CM

## 2022-12-20 DIAGNOSIS — M545 Low back pain, unspecified: Secondary | ICD-10-CM

## 2022-12-27 ENCOUNTER — Telehealth: Payer: Self-pay | Admitting: Internal Medicine

## 2022-12-27 NOTE — Telephone Encounter (Signed)
Prescription Request  12/27/2022  LOV: 10/15/2022  What is the name of the medication or equipment? naproxen (NAPROSYN) 500 MG tablet  cyclobenzaprine (FLEXERIL) 10 MG tablet    Have you contacted your pharmacy to request a refill? No   Which pharmacy would you like this sent to?     [facility] [address], [place] - [address] [place] [address] Phone: [phone] Fax: [phone]  Patient notified that their request is being sent to the clinical staff for review and that they should receive a response within 2 business days.   Please advise at Mobile [phone] (mobile)

## 2022-12-30 ENCOUNTER — Other Ambulatory Visit: Payer: Self-pay

## 2022-12-30 DIAGNOSIS — M545 Low back pain, unspecified: Secondary | ICD-10-CM

## 2022-12-30 DIAGNOSIS — M7661 Achilles tendinitis, right leg: Secondary | ICD-10-CM

## 2022-12-30 MED ORDER — NAPROXEN 500 MG PO TABS
500.0000 mg | ORAL_TABLET | Freq: Two times a day (BID) | ORAL | 0 refills | Status: DC
Start: 2022-12-30 — End: 2023-09-08

## 2022-12-30 MED ORDER — CYCLOBENZAPRINE HCL 10 MG PO TABS
10.0000 mg | ORAL_TABLET | Freq: Every day | ORAL | 0 refills | Status: DC
Start: 2022-12-30 — End: 2023-09-08

## 2022-12-30 NOTE — Telephone Encounter (Signed)
 Medication refill sent to Dr. Yetta Barre

## 2023-01-09 ENCOUNTER — Encounter: Payer: Self-pay | Admitting: Internal Medicine

## 2023-01-09 ENCOUNTER — Ambulatory Visit (INDEPENDENT_AMBULATORY_CARE_PROVIDER_SITE_OTHER): Payer: Medicaid Other | Admitting: Internal Medicine

## 2023-01-09 VITALS — BP 132/74 | HR 100 | Temp 99.6°F | Ht 67.0 in | Wt 198.0 lb

## 2023-01-09 DIAGNOSIS — E1122 Type 2 diabetes mellitus with diabetic chronic kidney disease: Secondary | ICD-10-CM

## 2023-01-09 DIAGNOSIS — N1831 Chronic kidney disease, stage 3a: Secondary | ICD-10-CM | POA: Diagnosis not present

## 2023-01-09 DIAGNOSIS — M10122 Lead-induced gout, left elbow: Secondary | ICD-10-CM | POA: Diagnosis not present

## 2023-01-09 DIAGNOSIS — T560X1A Toxic effect of lead and its compounds, accidental (unintentional), initial encounter: Secondary | ICD-10-CM | POA: Diagnosis not present

## 2023-01-09 DIAGNOSIS — M109 Gout, unspecified: Secondary | ICD-10-CM

## 2023-01-09 DIAGNOSIS — I10 Essential (primary) hypertension: Secondary | ICD-10-CM

## 2023-01-09 MED ORDER — METHYLPREDNISOLONE ACETATE 80 MG/ML IJ SUSP
80.0000 mg | Freq: Once | INTRAMUSCULAR | Status: AC
Start: 2023-01-09 — End: 2023-01-09
  Administered 2023-01-09: 80 mg via INTRAMUSCULAR

## 2023-01-09 MED ORDER — PREDNISONE 10 MG PO TABS: ORAL_TABLET | ORAL | 0 refills | Status: DC

## 2023-01-09 MED ORDER — ALLOPURINOL 300 MG PO TABS: 300.0000 mg | ORAL_TABLET | Freq: Every day | ORAL | 3 refills | Status: DC

## 2023-01-09 MED ORDER — HYDROCODONE-ACETAMINOPHEN 5-325 MG PO TABS
1.0000 | ORAL_TABLET | Freq: Four times a day (QID) | ORAL | 0 refills | Status: DC | PRN
Start: 2023-01-09 — End: 2023-09-09

## 2023-01-09 NOTE — Progress Notes (Signed)
Patient ID: Anthony Gill, male   DOB: June 14, 1970, 52 y.o.   MRN: [id number]        Chief Complaint: follow up with c/o 3 days left elbow and right ankle gout       HPI:  Anthony Gill is a 52 y.o. male here with hx of recurrent gout and good med compliance with allopurinol 100 mg, now with 3 days onset severe pain swelling to left elbow, but also milder pain swelling to right ankle, limping somewhat to walk today.  Pt denies chest pain, increased sob or doe, wheezing, orthopnea, PND, increased LE swelling, palpitations, dizziness or syncope.   Pt denies polydipsia, polyuria, or new focal neuro s/s.    Pt denies fever, wt loss, night sweats, loss of appetite, or other constitutional symptoms   Pt is on chronic diuretic       Wt Readings from Last 3 Encounters:  01/09/23 198 lb (89.8 kg)  11/19/22 198 lb 2 oz (89.9 kg)  11/05/22 195 lb 3.2 oz (88.5 kg)   BP Readings from Last 3 Encounters:  01/09/23 132/74  11/19/22 136/82  11/05/22 (!) 130/98         Past Medical History:  Diagnosis Date   Chicken pox    Epigastric pain 04/05/2014   Essential hypertension 05/02/2016   Gout    Headache disorder 2015   Tension w/medication overuse component suspected by neurologist (Dr. Everlena Cooper).  MRI brain ordered.   Migraines    PUD (peptic ulcer disease) 03/14/2014   Past Surgical History:  Procedure Laterality Date   COLONOSCOPY N/A 04/10/2014   Procedure: COLONOSCOPY;  Surgeon: Barrie Folk, MD;  Location: [facility] ENDOSCOPY;  Service: Endoscopy;  Laterality: N/A;   ESOPHAGOGASTRODUODENOSCOPY N/A 04/10/2014   Procedure: ESOPHAGOGASTRODUODENOSCOPY (EGD);  Surgeon: Barrie Folk, MD;  Location: [facility] ENDOSCOPY;  Service: Endoscopy;  Laterality: N/A;   HERNIA REPAIR  2006   WISDOM TOOTH EXTRACTION      reports that he quit smoking about 8 years ago. His smoking use included cigarettes. He started smoking about 11 years ago. He has never used smokeless tobacco. He reports current alcohol use of about 3.0  standard drinks of alcohol per week. He reports that he does not use drugs. family history includes Cancer in some other family members; Diabetes in his brother, mother, and sister; Heart attack in his paternal uncle; Heart attack (age of onset: 81) in his father; Heart disease in his father and paternal uncle; Hyperlipidemia in his brother, father, mother, and sister; Hypertension in an other family member; Prostate cancer in his father. Allergies  Allergen Reactions   Ciprofloxacin Itching   Colchicine     hives   Doxycycline     Stomach upset   Indapamide     Gout, cramps   Current Outpatient Medications on File Prior to Visit  Medication Sig Dispense Refill   cyclobenzaprine (FLEXERIL) 10 MG tablet Take 1 tablet (10 mg total) by mouth at bedtime. 15 tablet 0   naproxen (NAPROSYN) 500 MG tablet Take 1 tablet (500 mg total) by mouth 2 (two) times daily with a meal. 30 tablet 0   omega-3 acid ethyl esters (LOVAZA) 1 g capsule Take 2 capsules (2 g total) by mouth 2 (two) times daily. 360 capsule 0   pantoprazole (PROTONIX) 40 MG tablet Take 1 tablet (40 mg total) by mouth daily. 90 tablet 0   rosuvastatin (CRESTOR) 5 MG tablet Take 1 tablet (5 mg total) by mouth daily. 90  tablet 0   triamterene-hydrochlorothiazide (DYAZIDE) 37.5-25 MG capsule Take 1 each (1 capsule total) by mouth daily. 90 capsule 0   No current facility-administered medications on file prior to visit.        ROS:  All others reviewed and negative.  Objective        PE:  BP 132/74 (BP Location: Right Arm, Patient Position: Sitting, Cuff Size: Normal)   Pulse 100   Temp 99.6 F (37.6 C) (Oral)   Ht 5\' 7"  (1.702 m)   Wt 198 lb (89.8 kg)   SpO2 96%   BMI 31.01 kg/m                 Constitutional: Pt appears in NAD               HENT: Head: NCAT.                Right Ear: External ear normal.                 Left Ear: External ear normal.                Eyes: . Pupils are equal, round, and reactive to light.  Conjunctivae and EOM are normal               Nose: without d/c or deformity               Neck: Neck supple. Gross normal ROM               Cardiovascular: Normal rate and regular rhythm.                 Pulmonary/Chest: Effort normal and breath sounds without rales or wheezing.                              Neurological: Pt is alert. At baseline orientation, motor grossly intact               Skin: Skin is warm. No rashes, no other new lesions, LE edema - none; left elbow olecranon with 2-3+ swelling, heat tender;  right ankle with trace heat, mod tender and swelling               Psychiatric: Pt behavior is normal without agitation   Micro: none  Cardiac tracings I have personally interpreted today:  none  Pertinent Radiological findings (summarize): none   Lab Results  Component Value Date   WBC 5.7 10/15/2022   HGB 14.4 10/15/2022   HCT 42.2 10/15/2022   PLT 214.0 10/15/2022   GLUCOSE 95 10/15/2022   CHOL 148 10/15/2022   TRIG (H) 10/15/2022    617.0 Triglyceride is over 400; calculations on Lipids are invalid.   HDL 33.00 (L) 10/15/2022   LDLDIRECT 43.0 10/15/2022   LDLCALC 42 06/09/2013   ALT 18 10/15/2022   AST 20 10/15/2022   NA 137 10/15/2022   K 3.7 10/15/2022   CL 100 10/15/2022   CREATININE 1.05 10/15/2022   BUN 8 10/15/2022   CO2 27 10/15/2022   TSH 2.48 10/15/2022   PSA 0.55 10/15/2022   HGBA1C 5.8 10/15/2022   MICROALBUR <0.7 10/15/2022   Assessment/Plan:  Anthony Gill is a 52 y.o. Black or African American [2] male with  has a past medical history of Chicken pox, Epigastric pain (04/05/2014), Essential hypertension (05/02/2016), Gout, Headache disorder (2015), Migraines, and PUD (peptic ulcer disease) (  03/14/2014).  Acute gouty arthritis Severe left elbow, mild right ankle, for depomedrol IM 80 mg , prednisone taper, increased allopurinol 300 every day, hydrocodon 5 325 prn limited rx, and work note.   to f/u any worsening symptoms or concerns, continue  diuretic for now though this is possibly aggrevating the recurrence tendency  Essential hypertension BP Readings from Last 3 Encounters:  01/09/23 132/74  11/19/22 136/82  11/05/22 (!) 130/98   Stable, pt to continue medical treatment dyazide 1 qd  Type 2 diabetes mellitus with chronic kidney disease, without long-term current use of insulin (HCC) Lab Results  Component Value Date   HGBA1C 5.8 10/15/2022   Stable, pt to continue current medical treatment  - diet, wt control  Followup: Return if symptoms worsen or fail to improve.  Oliver Barre, MD 01/11/2023 8:40 AM [facility]

## 2023-01-09 NOTE — Patient Instructions (Signed)
You had the steroid shot today  Please take all new medication as prescribed - the hydrocodone as needed for pain, prednisone  Ok to increase the allopurinol to 300 mg per day  You are given the work note today  Please continue all other medications as before, and refills have been done if requested.  Please have the pharmacy call with any other refills you may need.  Please keep your appointments with your specialists as you may have planned

## 2023-01-11 ENCOUNTER — Encounter: Payer: Self-pay | Admitting: Internal Medicine

## 2023-01-11 DIAGNOSIS — M109 Gout, unspecified: Secondary | ICD-10-CM | POA: Insufficient documentation

## 2023-01-11 NOTE — Assessment & Plan Note (Signed)
BP Readings from Last 3 Encounters:  01/09/23 132/74  11/19/22 136/82  11/05/22 (!) 130/98   Stable, pt to continue medical treatment dyazide 1 qd

## 2023-01-11 NOTE — Assessment & Plan Note (Signed)
Lab Results  Component Value Date   HGBA1C 5.8 10/15/2022   Stable, pt to continue current medical treatment  - diet, wt control

## 2023-01-11 NOTE — Assessment & Plan Note (Addendum)
Severe left elbow, mild right ankle, for depomedrol IM 80 mg , prednisone taper, increased allopurinol 300 every day, hydrocodon 5 325 prn limited rx, and work note.   to f/u any worsening symptoms or concerns, continue diuretic for now though this is possibly aggrevating the recurrence tendency

## 2023-01-16 ENCOUNTER — Telehealth: Payer: Self-pay | Admitting: Internal Medicine

## 2023-01-16 NOTE — Telephone Encounter (Signed)
Patient was seen by Dr. Jonny Ruiz on 01/09/23 for gout. He was prescribed prednisone to help. Patient said now that he has completed the medication, his gout is coming back. He would like to know what to do. Patient would like a call back at [phone].

## 2023-01-17 MED ORDER — PREDNISONE 10 MG PO TABS: ORAL_TABLET | ORAL | 0 refills | Status: DC

## 2023-01-17 NOTE — Telephone Encounter (Signed)
Please advise 

## 2023-01-17 NOTE — Telephone Encounter (Signed)
Called and let Pt know

## 2023-01-17 NOTE — Telephone Encounter (Signed)
Ok for repeat prednisone x 1 script as before, but will need ROV if has further recurrence, thanks

## 2023-02-25 ENCOUNTER — Other Ambulatory Visit: Payer: Self-pay | Admitting: Internal Medicine

## 2023-02-25 DIAGNOSIS — K219 Gastro-esophageal reflux disease without esophagitis: Secondary | ICD-10-CM

## 2023-05-18 ENCOUNTER — Other Ambulatory Visit: Payer: Self-pay | Admitting: Internal Medicine

## 2023-05-18 DIAGNOSIS — E785 Hyperlipidemia, unspecified: Secondary | ICD-10-CM

## 2023-05-18 DIAGNOSIS — Z8249 Family history of ischemic heart disease and other diseases of the circulatory system: Secondary | ICD-10-CM

## 2023-05-18 DIAGNOSIS — I10 Essential (primary) hypertension: Secondary | ICD-10-CM

## 2023-05-26 ENCOUNTER — Other Ambulatory Visit (HOSPITAL_COMMUNITY): Payer: Self-pay

## 2023-08-15 ENCOUNTER — Other Ambulatory Visit: Payer: Self-pay | Admitting: Internal Medicine

## 2023-08-15 DIAGNOSIS — Z8249 Family history of ischemic heart disease and other diseases of the circulatory system: Secondary | ICD-10-CM

## 2023-08-15 DIAGNOSIS — I10 Essential (primary) hypertension: Secondary | ICD-10-CM

## 2023-08-15 DIAGNOSIS — E785 Hyperlipidemia, unspecified: Secondary | ICD-10-CM

## 2023-08-19 ENCOUNTER — Ambulatory Visit: Payer: Self-pay

## 2023-08-19 NOTE — Telephone Encounter (Signed)
 FYI Only or Action Required?: Action required by provider  Patient was last seen in primary care on 01/09/2023 by Roslyn Coombe, MD. Called Nurse Triage reporting Gout, Foot Pain, redness to toe, and Foot Swelling. Symptoms began several days ago. Interventions attempted: Prescription medications: allopurinol . Symptoms are: gradually worsening.  Triage Disposition: See Physician Within 24 Hours  Patient/caregiver understands and will follow disposition?: Yes - No PCP availability, requesting prednisone  and call back       Copied from CRM [id number]. Topic: Clinical - Red Word Triage >> Aug 19, 2023  8:19 AM Alethia Huxley E wrote: [facility] that prompted transfer to Nurse Triage: Gout flare-up. Patient is experiencing gout in left foot, big toe for the past 2 days. Patient rated pain 7.5 out of 10. Reason for Disposition  [1] Redness of the skin AND [2] no fever  Answer Assessment - Initial Assessment Questions 1. ONSET: When did the pain start?      2 days ago 2. LOCATION: Where is the pain located?      Left foot big toe 3. PAIN: How bad is the pain?    (Scale 1-10; or mild, moderate, severe)  - MILD (1-3): doesn't interfere with normal activities.   - MODERATE (4-7): interferes with normal activities (e.g., work or school) or awakens from sleep, limping.   - SEVERE (8-10): excruciating pain, unable to do any normal activities, unable to walk.      7.5/10 5. CAUSE: What do you think is causing the foot pain?     Know it's my gout 6. OTHER SYMPTOMS: Do you have any other symptoms? (e.g., leg pain, rash, fever, numbness)     Heat and swelling, no pitting, no fever or numbness, only red around the toe, no spreading pain to leg Taking allopurinol  but not worked this time, need prednisone   Protocols used: Foot Pain-A-AH

## 2023-08-20 NOTE — Telephone Encounter (Signed)
 LVM for patient, offered him an appointment with another provider or suggested he present to UC. Cannot refill prednisone  without OV.

## 2023-09-08 ENCOUNTER — Ambulatory Visit (INDEPENDENT_AMBULATORY_CARE_PROVIDER_SITE_OTHER): Payer: Self-pay | Admitting: Internal Medicine

## 2023-09-08 VITALS — BP 128/78 | HR 96 | Temp 99.1°F | Resp 16 | Ht 67.0 in | Wt 212.0 lb

## 2023-09-08 DIAGNOSIS — E11618 Type 2 diabetes mellitus with other diabetic arthropathy: Secondary | ICD-10-CM | POA: Diagnosis not present

## 2023-09-08 DIAGNOSIS — I10 Essential (primary) hypertension: Secondary | ICD-10-CM

## 2023-09-08 DIAGNOSIS — E781 Pure hyperglyceridemia: Secondary | ICD-10-CM

## 2023-09-08 DIAGNOSIS — R7303 Prediabetes: Secondary | ICD-10-CM | POA: Insufficient documentation

## 2023-09-08 DIAGNOSIS — N400 Enlarged prostate without lower urinary tract symptoms: Secondary | ICD-10-CM

## 2023-09-08 DIAGNOSIS — K219 Gastro-esophageal reflux disease without esophagitis: Secondary | ICD-10-CM

## 2023-09-08 DIAGNOSIS — Z8249 Family history of ischemic heart disease and other diseases of the circulatory system: Secondary | ICD-10-CM

## 2023-09-08 DIAGNOSIS — M1A09X0 Idiopathic chronic gout, multiple sites, without tophus (tophi): Secondary | ICD-10-CM

## 2023-09-08 DIAGNOSIS — E785 Hyperlipidemia, unspecified: Secondary | ICD-10-CM

## 2023-09-08 DIAGNOSIS — Z7984 Long term (current) use of oral hypoglycemic drugs: Secondary | ICD-10-CM

## 2023-09-08 DIAGNOSIS — M545 Low back pain, unspecified: Secondary | ICD-10-CM

## 2023-09-08 DIAGNOSIS — E119 Type 2 diabetes mellitus without complications: Secondary | ICD-10-CM | POA: Insufficient documentation

## 2023-09-08 DIAGNOSIS — Z23 Encounter for immunization: Secondary | ICD-10-CM | POA: Insufficient documentation

## 2023-09-08 DIAGNOSIS — G8929 Other chronic pain: Secondary | ICD-10-CM

## 2023-09-08 LAB — HEPATIC FUNCTION PANEL
ALT: 21 U/L (ref 0–53)
AST: 21 U/L (ref 0–37)
Albumin: 4.5 g/dL (ref 3.5–5.2)
Alkaline Phosphatase: 68 U/L (ref 39–117)
Bilirubin, Direct: 0.1 mg/dL (ref 0.0–0.3)
Total Bilirubin: 0.4 mg/dL (ref 0.2–1.2)
Total Protein: 7.2 g/dL (ref 6.0–8.3)

## 2023-09-08 LAB — CBC WITH DIFFERENTIAL/PLATELET
Basophils Absolute: 0 K/uL (ref 0.0–0.1)
Basophils Relative: 0.3 % (ref 0.0–3.0)
Eosinophils Absolute: 0.1 K/uL (ref 0.0–0.7)
Eosinophils Relative: 0.8 % (ref 0.0–5.0)
HCT: 37.8 % — ABNORMAL LOW (ref 39.0–52.0)
Hemoglobin: 13.3 g/dL (ref 13.0–17.0)
Lymphocytes Relative: 27.7 % (ref 12.0–46.0)
Lymphs Abs: 1.7 K/uL (ref 0.7–4.0)
MCHC: 35.1 g/dL (ref 30.0–36.0)
MCV: 84.8 fl (ref 78.0–100.0)
Monocytes Absolute: 0.5 K/uL (ref 0.1–1.0)
Monocytes Relative: 8.3 % (ref 3.0–12.0)
Neutro Abs: 4 K/uL (ref 1.4–7.7)
Neutrophils Relative %: 62.9 % (ref 43.0–77.0)
Platelets: 208 K/uL (ref 150.0–400.0)
RBC: 4.46 Mil/uL (ref 4.22–5.81)
RDW: 14 % (ref 11.5–15.5)
WBC: 6.3 K/uL (ref 4.0–10.5)

## 2023-09-08 LAB — BASIC METABOLIC PANEL WITH GFR
BUN: 12 mg/dL (ref 6–23)
CO2: 30 meq/L (ref 19–32)
Calcium: 9 mg/dL (ref 8.4–10.5)
Chloride: 98 meq/L (ref 96–112)
Creatinine, Ser: 1.15 mg/dL (ref 0.40–1.50)
GFR: 73.92 mL/min (ref >60.00)
Glucose, Bld: 174 mg/dL — ABNORMAL HIGH (ref 70–99)
Potassium: 3.8 meq/L (ref 3.5–5.1)
Sodium: 137 meq/L (ref 135–145)

## 2023-09-08 LAB — TSH: TSH: 1 u[IU]/mL (ref 0.35–5.50)

## 2023-09-08 LAB — PSA: PSA: 0.42 ng/mL (ref 0.10–4.00)

## 2023-09-08 LAB — LIPID PANEL
Cholesterol: 189 mg/dL (ref 0–200)
HDL: 30.3 mg/dL — ABNORMAL LOW (ref >39.00)
NonHDL: 158.81
Total CHOL/HDL Ratio: 6
Triglycerides: 1026 mg/dL — ABNORMAL HIGH (ref 0.0–149.0)
VLDL: 205.2 mg/dL — ABNORMAL HIGH (ref 0.0–40.0)

## 2023-09-08 LAB — URIC ACID: Uric Acid, Serum: 8.3 mg/dL — ABNORMAL HIGH (ref 4.0–7.8)

## 2023-09-08 LAB — LDL CHOLESTEROL, DIRECT: Direct LDL: 41 mg/dL

## 2023-09-08 LAB — HEMOGLOBIN A1C: Hgb A1c MFr Bld: 8 % — ABNORMAL HIGH (ref 4.6–6.5)

## 2023-09-08 MED ORDER — SHINGRIX 50 MCG/0.5ML IM SUSR
0.5000 mL | Freq: Once | INTRAMUSCULAR | 1 refills | Status: AC
Start: 2023-09-08 — End: 2023-09-08

## 2023-09-08 NOTE — Progress Notes (Unsigned)
 Subjective:  Patient ID: Anthony Gill, male    DOB: [birth year]  Age: 53 y.o. MRN: [id number]  CC: Hypertension and Hyperlipidemia   HPI Anthony Gill presents for f/up ---  Discussed the use of AI scribe software for clinical note transcription with the patient, who gave verbal consent to proceed.  History of Present Illness   Anthony Gill is a 53 year old male with hypertension and gout who presents with low back pain and recent gout flare.  He has been experiencing low back pain for approximately one month. The pain is intermittent, located in the lower back, and does not radiate to the legs or feet. No numbness, weakness, or tingling in the legs or feet. He associates the onset of pain with starting a new job that involves a lot of bending. He is not currently taking any medication for pain.  He has a history of gout and experienced a flare last week, affecting his big toe and middle toe. He was unable to be seen for this flare and self-medicated with old prednisone , which alleviated the symptoms. He recalls previously taking colchicine  for gout but is unsure if he is currently prescribed this medication.  He is currently taking medications for hypertension, specifically triamterene  and hydrochlorothiazide , and rosuvastatin  for cholesterol. He denies any side effects from these medications.  He smokes black and mild and drinks alcohol, including liquor and beer, every other weekend. He confirms drinking this past weekend.  No abdominal pain, fever, or chills.       Outpatient Medications Prior to Visit  Medication Sig Dispense Refill   latanoprost (XALATAN) 0.005 % ophthalmic solution Place 1 drop into both eyes at bedtime.     pantoprazole  (PROTONIX ) 40 MG tablet Take 1 tablet by mouth once daily 90 tablet 0   triamterene -hydrochlorothiazide  (DYAZIDE) 37.5-25 MG capsule TAKE 1 CAPSULE BY MOUTH ONCE DAILY . APPOINTMENT REQUIRED FOR FUTURE REFILLS 30 capsule 0    allopurinol  (ZYLOPRIM ) 300 MG tablet Take 1 tablet (300 mg total) by mouth daily. 90 tablet 3   HYDROcodone -acetaminophen  (NORCO/VICODIN) 5-325 MG tablet Take 1 tablet by mouth every 6 (six) hours as needed. 30 tablet 0   omega-3 acid ethyl esters (LOVAZA ) 1 g capsule Take 2 capsules (2 g total) by mouth 2 (two) times daily. 360 capsule 0   predniSONE  (DELTASONE ) 10 MG tablet 3 tabs by mouth per day for 3 days,2tabs per day for 3 days,1tab per day for 3 days 18 tablet 0   rosuvastatin  (CRESTOR ) 5 MG tablet TAKE 1 TABLET BY MOUTH ONCE DAILY . APPOINTMENT REQUIRED FOR FUTURE REFILLS 30 tablet 0   cyclobenzaprine  (FLEXERIL ) 10 MG tablet Take 1 tablet (10 mg total) by mouth at bedtime. 15 tablet 0   naproxen  (NAPROSYN ) 500 MG tablet Take 1 tablet (500 mg total) by mouth 2 (two) times daily with a meal. 30 tablet 0   No facility-administered medications prior to visit.    ROS Review of Systems  Objective:  BP 128/78 (BP Location: Left Arm, Patient Position: Sitting, Cuff Size: Normal)   Pulse 96   Temp 99.1 F (37.3 C) (Oral)   Resp 16   Ht 5' 7 (1.702 m)   Wt 212 lb (96.2 kg)   SpO2 95%   BMI 33.20 kg/m   BP Readings from Last 3 Encounters:  09/08/23 128/78  01/09/23 132/74  11/19/22 136/82    Wt Readings from Last 3 Encounters:  09/08/23 212 lb (96.2 kg)  01/09/23  198 lb (89.8 kg)  11/19/22 198 lb 2 oz (89.9 kg)    Physical Exam Vitals reviewed.  Constitutional:      Appearance: Normal appearance.  Eyes:     General: No scleral icterus.    Conjunctiva/sclera: Conjunctivae normal.  Cardiovascular:     Rate and Rhythm: Normal rate and regular rhythm.     Heart sounds: No murmur heard.    No friction rub. No gallop.     Comments: EKG--- NSR, 93 bpm No LVH, Q waves, or ST/T wave changes  Pulmonary:     Effort: Pulmonary effort is normal.     Breath sounds: No stridor. No wheezing, rhonchi or rales.  Abdominal:     General: Abdomen is flat.     Palpations: There is  no mass.     Tenderness: There is no abdominal tenderness. There is no guarding.     Hernia: No hernia is present.  Musculoskeletal:        General: Normal range of motion.     Cervical back: Neck supple.     Right lower leg: No edema.     Left lower leg: No edema.  Lymphadenopathy:     Cervical: No cervical adenopathy.  Skin:    General: Skin is warm and dry.     Findings: No rash.  Neurological:     General: No focal deficit present.     Mental Status: He is alert. Mental status is at baseline.  Psychiatric:        Mood and Affect: Mood normal.        Behavior: Behavior normal.     Lab Results  Component Value Date   WBC 6.3 09/08/2023   HGB 13.3 09/08/2023   HCT 37.8 (L) 09/08/2023   PLT 208.0 09/08/2023   GLUCOSE 174 (H) 09/08/2023   CHOL 189 09/08/2023   TRIG (H) 09/08/2023    1026.0 Triglyceride is over 400; calculations on Lipids are invalid.   HDL 30.30 (L) 09/08/2023   LDLDIRECT 41.0 09/08/2023   LDLCALC 42 06/09/2013   ALT 21 09/08/2023   AST 21 09/08/2023   NA 137 09/08/2023   K 3.8 09/08/2023   CL 98 09/08/2023   CREATININE 1.15 09/08/2023   BUN 12 09/08/2023   CO2 30 09/08/2023   TSH 1.00 09/08/2023   PSA 0.42 09/08/2023   HGBA1C 8.0 (H) 09/08/2023    No results found.  Assessment & Plan:  Primary hypertriglyceridemia -     Lipid panel; Future -     Omega-3-acid  Ethyl Esters; Take 2 capsules (2 g total) by mouth 2 (two) times daily.  Dispense: 360 capsule; Refill: 0 -     AMB Referral [facility] Care Management  Idiopathic chronic gout of multiple sites without tophus -     Uric acid; Future -     Basic metabolic panel with GFR; Future -     Allopurinol ; Take 1 tablet (300 mg total) by mouth daily.  Dispense: 90 tablet; Refill: 0 -     AMB Referral [facility] Care Management  Gastroesophageal reflux disease without esophagitis -     CBC with Differential/Platelet; Future  Essential hypertension -     TSH; Future -     Urinalysis, Routine w reflex  microscopic; Future -     Basic metabolic panel with GFR; Future -     EKG 12-Lead  Dyslipidemia, goal LDL below 100 -     Lipid panel; Future -     TSH;  Future -     Hepatic function panel; Future -     Rosuvastatin  Calcium ; Take 1 tablet (5 mg total) by mouth daily.  Dispense: 90 tablet; Refill: 0  Benign prostatic hyperplasia without lower urinary tract symptoms -     PSA; Future -     Urinalysis, Routine w reflex microscopic; Future  Prediabetes -     Hemoglobin A1c; Future  Need for prophylactic vaccination and inoculation against varicella -     Shingrix ; Inject 0.5 mLs into the muscle once for 1 dose.  Dispense: 0.5 mL; Refill: 1  Type 2 diabetes mellitus with other diabetic arthropathy, without long-term current use of insulin (HCC) -     CT CARDIAC SCORING ([facility] LOCATIONS ONLY); Future -     metFORMIN  HCl ER; Take 1 tablet (750 mg total) by mouth daily with breakfast.  Dispense: 90 tablet; Refill: 0 -     AMB Referral [facility] Care Management -     Ambulatory referral to Ophthalmology  Chronic left-sided low back pain without sciatica -     Ambulatory referral to Physical Medicine Rehab  Family history of early CAD -     Rosuvastatin  Calcium ; Take 1 tablet (5 mg total) by mouth daily.  Dispense: 90 tablet; Refill: 0 -     CT CARDIAC SCORING ([facility] LOCATIONS ONLY); Future  Other orders -     LDL cholesterol, direct     Follow-up: Return in about 6 months (around 03/10/2024).  Debby Molt, MD

## 2023-09-08 NOTE — Patient Instructions (Signed)
Gout  Gout is a condition that causes painful swelling of the joints. Gout is a type of inflammation of the joints (arthritis). This condition is caused by having too much uric acid in the body. Uric acid is a chemical that forms when the body breaks down substances called purines. Purines are important for building body proteins. When the body has too much uric acid, sharp crystals can form and build up inside the joints. This causes pain and swelling. Gout attacks can happen quickly and may be very painful (acute gout). Over time, the attacks can affect more joints and become more frequent (chronic gout). Gout can also cause uric acid to build up under the skin and inside the kidneys. What are the causes? This condition is caused by too much uric acid in your blood. This can happen because: Your kidneys do not remove enough uric acid from your blood. This is the most common cause. Your body makes too much uric acid. This can happen with some cancers and cancer treatments. It can also occur if your body is breaking down too many red blood cells (hemolytic anemia). You eat too many foods that are high in purines. These foods include organ meats and some seafood. Alcohol, especially beer, is also high in purines. A gout attack may be triggered by trauma or stress. What increases the risk? The following factors may make you more likely to develop this condition: Having a family history of gout. Being male and middle-aged. Being male and having gone through menopause. Taking certain medicines, including aspirin, cyclosporine, diuretics, levodopa, and niacin. Having an organ transplant. Having certain conditions, such as: Being obese. Lead poisoning. Kidney disease. A skin condition called psoriasis. Other factors include: Losing weight too quickly. Being dehydrated. Frequently drinking alcohol, especially beer. Frequently drinking beverages that are sweetened with a type of sugar called  fructose. What are the signs or symptoms? An attack of acute gout happens quickly. It usually occurs in just one joint. The most common place is the big toe. Attacks often start at night. Other joints that may be affected include joints of the feet, ankle, knee, fingers, wrist, or elbow. Symptoms of this condition may include: Severe pain. Warmth. Swelling. Stiffness. Tenderness. The affected joint may be very painful to touch. Shiny, red, or purple skin. Chills and fever. Chronic gout may cause symptoms more frequently. More joints may be involved. You may also have white or yellow lumps (tophi) on your hands or feet or in other areas near your joints. How is this diagnosed? This condition is diagnosed based on your symptoms, your medical history, and a physical exam. You may have tests, such as: Blood tests to measure uric acid levels. Removal of joint fluid with a thin needle (aspiration) to look for uric acid crystals. X-rays to look for joint damage. How is this treated? Treatment for this condition has two phases: treating an acute attack and preventing future attacks. Acute gout treatment may include medicines to reduce pain and swelling, including: NSAIDs, such as ibuprofen. Steroids. These are strong anti-inflammatory medicines that can be taken by mouth (orally) or injected into a joint. Colchicine. This medicine relieves pain and swelling when it is taken soon after an attack. It can be given by mouth or through an IV. Preventive treatment may include: Daily use of smaller doses of NSAIDs or colchicine. Use of a medicine that reduces uric acid levels in your blood, such as allopurinol. Changes to your diet. You may need to see   a dietitian about what to eat and drink to prevent gout. Follow these instructions at home: During a gout attack  If directed, put ice on the affected area. To do this: Put ice in a plastic bag. Place a towel between your skin and the bag. Leave the  ice on for 20 minutes, 2-3 times a day. Remove the ice if your skin turns bright red. This is very important. If you cannot feel pain, heat, or cold, you have a greater risk of damage to the area. Raise (elevate) the affected joint above the level of your heart as often as possible. Rest the joint as much as possible. If the affected joint is in your leg, you may be given crutches to use. Follow instructions from your health care provider about eating or drinking restrictions. Avoiding future gout attacks Follow a low-purine diet as told by your dietitian or health care provider. Avoid foods and drinks that are high in purines, including liver, kidney, anchovies, asparagus, herring, mushrooms, mussels, and beer. Maintain a healthy weight or lose weight if you are overweight. If you want to lose weight, talk with your health care provider. Do not lose weight too quickly. Start or maintain an exercise program as told by your health care provider. Eating and drinking Avoid drinking beverages that contain fructose. Drink enough fluids to keep your urine pale yellow. If you drink alcohol: Limit how much you have to: 0-1 drink a day for women who are not pregnant. 0-2 drinks a day for men. Know how much alcohol is in a drink. In the [place] one drink equals one 12 oz bottle of beer (355 mL), one 5 oz glass of wine (148 mL), or one 1 oz glass of hard liquor (44 mL). General instructions Take over-the-counter and prescription medicines only as told by your health care provider. Ask your health care provider if the medicine prescribed to you requires you to avoid driving or using machinery. Return to your normal activities as told by your health care provider. Ask your health care provider what activities are safe for you. Keep all follow-up visits. This is important. Where to find more information National Institutes of Health: www.niams.nih.gov Contact a health care provider if you have: Another  gout attack. Continuing symptoms of a gout attack after 10 days of treatment. Side effects from your medicines. Chills or a fever. Burning pain when you urinate. Pain in your lower back or abdomen. Get help right away if you: Have severe or uncontrolled pain. Cannot urinate. Summary Gout is painful swelling of the joints caused by having too much uric acid in the body. The most common site for gout to occur is in the big toe, but it can affect other joints in the body. Medicines and dietary changes can help to prevent and treat gout attacks. This information is not intended to replace advice given to you by your health care provider. Make sure you discuss any questions you have with your health care provider. Document Revised: 11/22/2020 Document Reviewed: 11/22/2020 Elsevier Patient Education  2024 Elsevier Inc.  

## 2023-09-09 ENCOUNTER — Other Ambulatory Visit: Payer: Self-pay | Admitting: Internal Medicine

## 2023-09-09 ENCOUNTER — Ambulatory Visit: Payer: Self-pay

## 2023-09-09 ENCOUNTER — Ambulatory Visit: Payer: Self-pay | Admitting: Internal Medicine

## 2023-09-09 DIAGNOSIS — K219 Gastro-esophageal reflux disease without esophagitis: Secondary | ICD-10-CM

## 2023-09-09 DIAGNOSIS — E119 Type 2 diabetes mellitus without complications: Secondary | ICD-10-CM | POA: Insufficient documentation

## 2023-09-09 LAB — URINALYSIS, ROUTINE W REFLEX MICROSCOPIC
Bilirubin Urine: NEGATIVE
Hgb urine dipstick: NEGATIVE
Leukocytes,Ua: NEGATIVE
Nitrite: NEGATIVE
RBC / HPF: NONE SEEN (ref 0–?)
Specific Gravity, Urine: 1.02 (ref 1.000–1.030)
Total Protein, Urine: NEGATIVE
Urine Glucose: 100 — AB
Urobilinogen, UA: 0.2 (ref 0.0–1.0)
WBC, UA: NONE SEEN (ref 0–?)
pH: 6 (ref 5.0–8.0)

## 2023-09-09 MED ORDER — ALLOPURINOL 300 MG PO TABS: 300.0000 mg | ORAL_TABLET | Freq: Every day | ORAL | 0 refills | Status: DC

## 2023-09-09 MED ORDER — METFORMIN HCL ER 750 MG PO TB24: 750.0000 mg | ORAL_TABLET | Freq: Every day | ORAL | 0 refills | Status: DC

## 2023-09-09 MED ORDER — OMEGA-3-ACID ETHYL ESTERS 1 G PO CAPS: 2.0000 g | ORAL_CAPSULE | Freq: Two times a day (BID) | ORAL | 0 refills | Status: AC

## 2023-09-09 MED ORDER — ROSUVASTATIN CALCIUM 5 MG PO TABS: 5.0000 mg | ORAL_TABLET | Freq: Every day | ORAL | 0 refills | Status: DC

## 2023-09-09 NOTE — Telephone Encounter (Addendum)
 FYI Only or Action Required?: Action required by provider: would like a call back before.taking metformin .  Patient does not believe he has diabetes.   Patient was last seen in primary care on 09/08/2023 by Joshua Debby CROME, MD.  Called Nurse Triage reporting Advice Only.  Symptoms began today.  Interventions attempted: Nothing.  Symptoms are: unchanged.  Triage Disposition: Call PCP Within 24 Hours  Patient/caregiver understands and will follow disposition?:        First attempt; no answer Second attempt; no answer    Copied from CRM [id number]. Topic: Clinical - Medication Question >> Sep 09, 2023 10:00 AM Macario HERO wrote: Reason for CRM: Patient stated that he received metFORMIN  (GLUCOPHAGE -XR) 750 MG 24 hr tablet [[id number]] and wants to know why he was prescribed that.

## 2023-09-16 ENCOUNTER — Other Ambulatory Visit

## 2023-09-19 ENCOUNTER — Telehealth (INDEPENDENT_AMBULATORY_CARE_PROVIDER_SITE_OTHER): Payer: Self-pay | Admitting: Internal Medicine

## 2023-09-19 ENCOUNTER — Ambulatory Visit: Payer: Self-pay

## 2023-09-19 DIAGNOSIS — R7303 Prediabetes: Secondary | ICD-10-CM

## 2023-09-19 DIAGNOSIS — E781 Pure hyperglyceridemia: Secondary | ICD-10-CM

## 2023-09-19 DIAGNOSIS — M109 Gout, unspecified: Secondary | ICD-10-CM

## 2023-09-19 MED ORDER — HYDROCODONE-ACETAMINOPHEN 5-325 MG PO TABS: 1.0000 | ORAL_TABLET | Freq: Four times a day (QID) | ORAL | 0 refills | Status: DC | PRN

## 2023-09-19 MED ORDER — PREDNISONE 10 MG PO TABS
ORAL_TABLET | ORAL | 0 refills | Status: DC
Start: 2023-09-19 — End: 2023-12-29

## 2023-09-19 MED ORDER — INDOMETHACIN 50 MG PO CAPS: 50.0000 mg | ORAL_CAPSULE | Freq: Three times a day (TID) | ORAL | 3 refills | Status: DC | PRN

## 2023-09-19 NOTE — Progress Notes (Signed)
 Patient ID: Anthony Gill, male   DOB: February 24, 1971, 53 y.o.   MRN: [id number]  Virtual Visit via Video Note  I connected with [name] on 09/20/23 at  3:20 PM EDT by a video enabled telemedicine application and verified that I am speaking with the correct person using two identifiers.  Location of all participants today Patient: at home Provider: at office   I discussed the limitations of evaluation and management by telemedicine and the availability of in person appointments. The patient expressed understanding and agreed to proceed.  History of Present Illness: Here to c/o large rapid flare of right first MTP gout x 3 days as has occurred before.  Currently taking allopurinol  300 mg, but has required prednisone  in past to clear.  Pain in 9/10.  Also requesting a prn med to avoid large episodes again in future.  No trauma or fever.  Pt denies chest pain, increased sob or doe, wheezing, orthopnea, PND, increased LE swelling, palpitations, dizziness or syncope.   Pt denies polydipsia, polyuria, or new focal neuro s/s.    Past Medical History:  Diagnosis Date   Chicken pox    Epigastric pain 04/05/2014   Essential hypertension 05/02/2016   Gout    Headache disorder 2015   Tension w/medication overuse component suspected by neurologist (Dr. Skeet).  MRI brain ordered.   Migraines    PUD (peptic ulcer disease) 03/14/2014   Past Surgical History:  Procedure Laterality Date   COLONOSCOPY N/A 04/10/2014   Procedure: COLONOSCOPY;  Surgeon: Norleen JAYSON Hint, MD;  Location: [facility] ENDOSCOPY;  Service: Endoscopy;  Laterality: N/A;   ESOPHAGOGASTRODUODENOSCOPY N/A 04/10/2014   Procedure: ESOPHAGOGASTRODUODENOSCOPY (EGD);  Surgeon: Norleen JAYSON Hint, MD;  Location[facility] ENDOSCOPY;  Service: Endoscopy;  Laterality: N/A;   HERNIA REPAIR  2006   WISDOM TOOTH EXTRACTION      reports that he has been smoking cigarettes. He started smoking about 12 years ago. He has never used smokeless tobacco. He reports current  alcohol use of about 3.0 standard drinks of alcohol per week. He reports that he does not use drugs. family history includes Cancer in some other family members; Diabetes in his brother, mother, and sister; Heart attack in his paternal uncle; Heart attack (age of onset: 63) in his father; Heart disease in his father and paternal uncle; Hyperlipidemia in his brother, father, mother, and sister; Hypertension in an other family member; Prostate cancer in his father. Allergies  Allergen Reactions   Ciprofloxacin  Itching   Colchicine      hives   Doxycycline      Stomach upset   Indapamide      Gout, cramps   Current Outpatient Medications on File Prior to Visit  Medication Sig Dispense Refill   allopurinol  (ZYLOPRIM ) 300 MG tablet Take 1 tablet (300 mg total) by mouth daily. 90 tablet 0   latanoprost (XALATAN) 0.005 % ophthalmic solution Place 1 drop into both eyes at bedtime.     metFORMIN  (GLUCOPHAGE -XR) 750 MG 24 hr tablet Take 1 tablet (750 mg total) by mouth daily with breakfast. 90 tablet 0   omega-3 acid ethyl esters (LOVAZA ) 1 g capsule Take 2 capsules (2 g total) by mouth 2 (two) times daily. 360 capsule 0   pantoprazole  (PROTONIX ) 40 MG tablet Take 1 tablet by mouth once daily 90 tablet 0   rosuvastatin  (CRESTOR ) 5 MG tablet Take 1 tablet (5 mg total) by mouth daily. 90 tablet 0   triamterene -hydrochlorothiazide  (DYAZIDE) 37.5-25 MG capsule TAKE 1 CAPSULE BY  MOUTH ONCE DAILY . APPOINTMENT REQUIRED FOR FUTURE REFILLS 30 capsule 0   No current facility-administered medications on file prior to visit.    Observations/Objective: Alert, NAD, appropriate mood and affect, resps normal, cn 2-12 intact, moves all 4s, no visible rash or swelling except for right foot with large 2-3+ red, tender swelling first MTP and distal foot Lab Results  Component Value Date   WBC 6.3 09/08/2023   HGB 13.3 09/08/2023   HCT 37.8 (L) 09/08/2023   PLT 208.0 09/08/2023   GLUCOSE 174 (H) 09/08/2023   CHOL  189 09/08/2023   TRIG (H) 09/08/2023    1026.0 Triglyceride is over 400; calculations on Lipids are invalid.   HDL 30.30 (L) 09/08/2023   LDLDIRECT 41.0 09/08/2023   LDLCALC 42 06/09/2013   ALT 21 09/08/2023   AST 21 09/08/2023   NA 137 09/08/2023   K 3.8 09/08/2023   CL 98 09/08/2023   CREATININE 1.15 09/08/2023   BUN 12 09/08/2023   CO2 30 09/08/2023   TSH 1.00 09/08/2023   PSA 0.42 09/08/2023   HGBA1C 8.0 (H) 09/08/2023   Assessment and Plan: See notes  Follow Up Instructions: See notes   I discussed the assessment and treatment plan with the patient. The patient was provided an opportunity to ask questions and all were answered. The patient agreed with the plan and demonstrated an understanding of the instructions.   The patient was advised to call back or seek an in-person evaluation if the symptoms worsen or if the condition fails to improve as anticipated.   Lynwood Rush, MD

## 2023-09-19 NOTE — Telephone Encounter (Signed)
  FYI Only or Action Required?: FYI only for provider.  Patient was last seen in primary care on 09/08/2023 by Joshua Debby CROME, MD.  Called Nurse Triage reporting Gout.  Symptoms began today.  Interventions attempted: Nothing.daily allopurinol   Symptoms are: gradually worsening.  Triage Disposition: See HCP Within 4 Hours (Or PCP Triage)  Patient/caregiver understands and will follow disposition?: Yes  Copied from CRM [id number]. Topic: Clinical - Red Word Triage >> Sep 19, 2023  2:58 PM Mercedes MATSU wrote: Red Word that prompted transfer to Nurse Triage: Patient states that he is having a gout flare up and his legs are swollen. Reason for Disposition  [1] SEVERE pain (e.g., excruciating, unable to do any normal activities) AND [2] not improved after 2 hours of pain medicine  Answer Assessment - Initial Assessment Questions Patient would like RX for prednisone  to treat current gout flare up    1. ONSET: When did the pain start?      Noon today 2. LOCATION: Where is the pain located?      Right foot pain 3. PAIN: How bad is the pain?    (Scale 1-10; or mild, moderate, severe)     Cannot put pressure on his foot 9/10 4. WORK OR EXERCISE: Has there been any recent work or exercise that involved this part of the body?      denies 5. CAUSE: What do you think is causing the foot pain?     Gout flare up 6. OTHER SYMPTOMS: Do you have any other symptoms? (e.g., leg pain, rash, fever, numbness)     denies  Protocols used: Foot Pain-A-AH

## 2023-09-20 ENCOUNTER — Encounter: Payer: Self-pay | Admitting: Internal Medicine

## 2023-09-20 DIAGNOSIS — M109 Gout, unspecified: Secondary | ICD-10-CM | POA: Insufficient documentation

## 2023-09-20 NOTE — Patient Instructions (Signed)
 Please take all new medication as prescribed

## 2023-09-20 NOTE — Assessment & Plan Note (Signed)
 Mod to severe, for prednisone  taper, pain control, continue alloopurinol 300 mg every day, and add indocin  50 mg tid prn recurrence, pt   to f/u any worsening symptoms or concerns

## 2023-09-20 NOTE — Assessment & Plan Note (Signed)
 Lab Results  Component Value Date   HGBA1C 8.0 (H) 09/08/2023   Stable, pt to continue current medical treatment metfomrin ER 750 mg every day,

## 2023-09-20 NOTE — Assessment & Plan Note (Signed)
 D/w pt, for lower fat cholesterol diet, for f/u lipids 3 mo per pcp, consider fenofibrate  sstart

## 2023-09-21 ENCOUNTER — Other Ambulatory Visit: Payer: Self-pay | Admitting: Internal Medicine

## 2023-09-21 DIAGNOSIS — I10 Essential (primary) hypertension: Secondary | ICD-10-CM

## 2023-09-22 ENCOUNTER — Ambulatory Visit: Payer: Self-pay

## 2023-09-22 ENCOUNTER — Telehealth: Payer: Self-pay | Admitting: *Deleted

## 2023-09-22 NOTE — Progress Notes (Unsigned)
 Care Guide Pharmacy Note  09/22/2023 Name: Anthony Gill MRN: [id number] DOB: [birth year]  Referred By: Joshua Debby CROME, MD Reason for referral: Call Attempt #1 and Complex Care Management (Outreach to schedule referral with pharmacist )   Anthony Gill is a 53 y.o. year old male who is a primary care patient of Joshua Debby CROME, MD.  [name] was referred to the pharmacist for assistance related to: DMII  An unsuccessful telephone outreach was attempted today to contact the patient who was referred to the pharmacy team for assistance with medication management. Additional attempts will be made to contact the patient.  [name], CMA, Care Guide [facility]  [facility], [facility] Guide Direct Dial: [phone]  Fax: [phone] Website: [email or web address]

## 2023-09-22 NOTE — Telephone Encounter (Signed)
 FYI Only or Action Required?: FYI only for provider.  Patient was last seen in primary care on 09/19/2023 by Norleen Lynwood ORN, MD.  Called Nurse Triage reporting Abdominal Pain. Pt thinks this may be from new medications  Symptoms began yesterday.  Interventions attempted: Nothing eating /drinking helps for a short time.  Symptoms are: gradually worsening.  Triage Disposition: See Physician Within 24 Hours  Patient/caregiver understands and will follow disposition?: Yes                      Copied from CRM [id number]. Topic: Clinical - Red Word Triage >> Sep 22, 2023  3:51 PM Mesmerise C wrote: Red Word that prompted transfer to Nurse Triage: Patient stated he's taking 2 medications Indomethacin  and metformin  doesn't know which one it is is giving a burning pain in stomach, and causing constipation as well Reason for Disposition  [1] MODERATE pain (e.g., interferes with normal activities) AND [2] pain comes and goes (cramps) AND [3] present > 24 hours  (Exception: Pain with Vomiting or Diarrhea - see that Guideline.)  Answer Assessment - Initial Assessment Questions 1. LOCATION: Where does it hurt?      Below his belly button - burning 2. RADIATION: Does the pain shoot anywhere else? (e.g., chest, back)     no 3. ONSET: When did the pain begin? (Minutes, hours or days ago)      Yesterday morning after taking both medications 4. SUDDEN: Gradual or sudden onset?     gradual 5. PATTERN Does the pain come and go, or is it constant?     Comes and goes 6. SEVERITY: How bad is the pain?  (e.g., Scale 1-10; mild, moderate, or severe)     7/10 7. RECURRENT SYMPTOM: Have you ever had this type of stomach pain before? If Yes, ask: When was the last time? and What happened that time?      no 8. CAUSE: What do you think is causing the stomach pain? (e.g., gallstones, recent abdominal surgery)     New medications 9. RELIEVING/AGGRAVATING FACTORS: What makes  it better or worse? (e.g., antacids, bending or twisting motion, bowel movement)     Eating and drinking helps for a short time 10. OTHER SYMPTOMS: Do you have any other symptoms? (e.g., back pain, diarrhea, fever, urination pain, vomiting)       constipation  Protocols used: Abdominal Pain - Male-A-AH

## 2023-09-23 ENCOUNTER — Telehealth: Payer: Self-pay

## 2023-09-23 ENCOUNTER — Encounter: Payer: Self-pay | Admitting: Internal Medicine

## 2023-09-23 ENCOUNTER — Other Ambulatory Visit (HOSPITAL_COMMUNITY): Payer: Self-pay

## 2023-09-23 ENCOUNTER — Telehealth (INDEPENDENT_AMBULATORY_CARE_PROVIDER_SITE_OTHER): Payer: Self-pay | Admitting: Internal Medicine

## 2023-09-23 DIAGNOSIS — E1165 Type 2 diabetes mellitus with hyperglycemia: Secondary | ICD-10-CM

## 2023-09-23 DIAGNOSIS — M109 Gout, unspecified: Secondary | ICD-10-CM

## 2023-09-23 DIAGNOSIS — K219 Gastro-esophageal reflux disease without esophagitis: Secondary | ICD-10-CM

## 2023-09-23 MED ORDER — DAPAGLIFLOZIN PROPANEDIOL 10 MG PO TABS: 10.0000 mg | ORAL_TABLET | Freq: Every day | ORAL | 3 refills | Status: DC

## 2023-09-23 NOTE — Telephone Encounter (Signed)
 Can you run the PA for the farxiga  ? Or do I need to see if Dr. Joshua will change the medication ?

## 2023-09-23 NOTE — Progress Notes (Unsigned)
 Care Guide Pharmacy Note  09/23/2023 Name: Anthony Gill MRN: [id number] DOB: 07-16-1970  Referred By: Joshua Debby CROME, MD Reason for referral: Call Attempt #1 and Complex Care Management (Outreach to schedule referral with pharmacist )   [name] is a 53 y.o. year old male who is a primary care patient of Joshua Debby CROME, MD.  [name] was referred to the pharmacist for assistance related to: DMII  A second unsuccessful telephone outreach was attempted today to contact the patient who was referred to the pharmacy team for assistance with medication management. Additional attempts will be made to contact the patient.  [name], CMA [place]  [facility], [facility] Guide Direct Dial: [phone]  Fax: [phone] Website: [email or web address]

## 2023-09-23 NOTE — Patient Instructions (Addendum)
 Ok to stop the metformin   Please take all new medication as prescribed - the farxiga  10 mg  Please continue all other medications as before for gout  Please have the pharmacy call with any other refills you may need.  Please keep your appointments with your specialists as you may have planned  Please see Dr Joshua in 3 months

## 2023-09-23 NOTE — Progress Notes (Signed)
 Patient ID: Anthony Gill, male   DOB: 24-Dec-1970, 53 y.o.   MRN: [id number]  Virtual Visit via Video Note  I connected with Anthony Gill on 09/23/23 at  1:20 PM EDT by a video enabled telemedicine application and verified that I am speaking with the correct person using two identifiers.  Location of all participants today Patient: at home Provider: at office   I discussed the limitations of evaluation and management by telemedicine and the availability of in person appointments. The patient expressed understanding and agreed to proceed.  History of Present Illness: Here to f/u, after seen at [facility] July 18 with tx for acute gouty arthritis and DM, seems to do ok but gets crampy abd pains every time he takes the metformin  ER 750 mg daily.  Pt denies chest pain, increased sob or doe, wheezing, orthopnea, PND, increased LE swelling, palpitations, dizziness or syncope.   Pt denies polydipsia, polyuria, or new focal neuro s/s.   Denies worsening reflux, dysphagia, n/v, bowel change or blood.  Past Medical History:  Diagnosis Date   Chicken pox    Epigastric pain 04/05/2014   Essential hypertension 05/02/2016   Gout    Headache disorder 2015   Tension w/medication overuse component suspected by neurologist (Dr. Skeet).  MRI brain ordered.   Migraines    PUD (peptic ulcer disease) 03/14/2014   Past Surgical History:  Procedure Laterality Date   COLONOSCOPY N/A 04/10/2014   Procedure: COLONOSCOPY;  Surgeon: Norleen JAYSON Hint, MD;  Location: [facility] ENDOSCOPY;  Service: Endoscopy;  Laterality: N/A;   ESOPHAGOGASTRODUODENOSCOPY N/A 04/10/2014   Procedure: ESOPHAGOGASTRODUODENOSCOPY (EGD);  Surgeon: Norleen JAYSON Hint, MD;  Location: [facility] ENDOSCOPY;  Service: Endoscopy;  Laterality: N/A;   HERNIA REPAIR  2006   WISDOM TOOTH EXTRACTION      reports that he has been smoking cigarettes. He started smoking about 12 years ago. He has never used smokeless tobacco. He reports current alcohol use of about 3.0 standard  drinks of alcohol per week. He reports that he does not use drugs. family history includes Cancer in some other family members; Diabetes in his brother, mother, and sister; Heart attack in his paternal uncle; Heart attack (age of onset: 67) in his father; Heart disease in his father and paternal uncle; Hyperlipidemia in his brother, father, mother, and sister; Hypertension in an other family member; Prostate cancer in his father. Allergies  Allergen Reactions   Ciprofloxacin  Itching   Colchicine      hives   Doxycycline      Stomach upset   Indapamide      Gout, cramps   Metformin  And Related    Current Outpatient Medications on File Prior to Visit  Medication Sig Dispense Refill   allopurinol  (ZYLOPRIM ) 300 MG tablet Take 1 tablet (300 mg total) by mouth daily. 90 tablet 0   HYDROcodone -acetaminophen  (NORCO/VICODIN) 5-325 MG tablet Take 1 tablet by mouth every 6 (six) hours as needed. 30 tablet 0   indomethacin  (INDOCIN ) 50 MG capsule Take 1 capsule (50 mg total) by mouth 3 (three) times daily as needed. For gout 90 capsule 3   latanoprost (XALATAN) 0.005 % ophthalmic solution Place 1 drop into both eyes at bedtime.     omega-3 acid ethyl esters (LOVAZA ) 1 g capsule Take 2 capsules (2 g total) by mouth 2 (two) times daily. 360 capsule 0   pantoprazole  (PROTONIX ) 40 MG tablet Take 1 tablet by mouth once daily 90 tablet 0   predniSONE  (DELTASONE ) 10 MG tablet 3 tabs  by mouth per day for 3 days,2tabs per day for 3 days,1tab per day for 3 days 18 tablet 0   rosuvastatin  (CRESTOR ) 5 MG tablet Take 1 tablet (5 mg total) by mouth daily. 90 tablet 0   triamterene -hydrochlorothiazide  (DYAZIDE) 37.5-25 MG capsule TAKE 1 CAPSULE BY MOUTH ONCE DAILY . APPOINTMENT REQUIRED FOR FUTURE REFILLS 30 capsule 0   No current facility-administered medications on file prior to visit.    Observations/Objective: Alert, NAD, appropriate mood and affect, resps normal, cn 2-12 intact, moves all 4s, no visible rash or  swelling Lab Results  Component Value Date   WBC 6.3 09/08/2023   HGB 13.3 09/08/2023   HCT 37.8 (L) 09/08/2023   PLT 208.0 09/08/2023   GLUCOSE 174 (H) 09/08/2023   CHOL 189 09/08/2023   TRIG (H) 09/08/2023    1026.0 Triglyceride is over 400; calculations on Lipids are invalid.   HDL 30.30 (L) 09/08/2023   LDLDIRECT 41.0 09/08/2023   LDLCALC 42 06/09/2013   ALT 21 09/08/2023   AST 21 09/08/2023   NA 137 09/08/2023   K 3.8 09/08/2023   CL 98 09/08/2023   CREATININE 1.15 09/08/2023   BUN 12 09/08/2023   CO2 30 09/08/2023   TSH 1.00 09/08/2023   PSA 0.42 09/08/2023   HGBA1C 8.0 (H) 09/08/2023   Assessment and Plan: See notes  Follow Up Instructions: See notes   I discussed the assessment and treatment plan with the patient. The patient was provided an opportunity to ask questions and all were answered. The patient agreed with the plan and demonstrated an understanding of the instructions.   The patient was advised to call back or seek an in-person evaluation if the symptoms worsen or if the condition fails to improve as anticipated.   Lynwood Rush, MD

## 2023-09-23 NOTE — Assessment & Plan Note (Signed)
 Improved, to finish prednisone , and continue allopurinol  with indocin  prn for any recurrence

## 2023-09-23 NOTE — Assessment & Plan Note (Signed)
 New onset recently,  Lab Results  Component Value Date   HGBA1C 8.0 (H) 09/08/2023  Not tolerating metformin  - ok for change to farxiga  10 mg qd

## 2023-09-23 NOTE — Telephone Encounter (Signed)
 Pharmacy Patient Advocate Encounter   Received notification from Patient Pharmacy that prior authorization for Farxiga  10mg  tabs is required/requested.   Insurance verification completed.   The patient is insured through [facility] .   Per test claim:  Anthony Gill is preferred by the insurance.  If suggested medication is appropriate, Please send in a new RX and discontinue this one. If not, please advise as to why it's not appropriate so that we may request a Prior Authorization. Please note, some preferred medications may still require a PA.  If the suggested medications have not been trialed and there are no contraindications to their use, the PA will not be submitted, as it will not be approved.   Per test claim, the copay for Jardiance 10mg  #30 is $34.99 if filled at one of the [facility].   Please advise

## 2023-09-23 NOTE — Assessment & Plan Note (Signed)
 Stable ,c ont protonix  40 mg qd

## 2023-09-24 NOTE — Telephone Encounter (Signed)
 Patient has been made aware of the medication change.

## 2023-09-24 NOTE — Progress Notes (Signed)
 Care Guide Pharmacy Note  09/24/2023 Name: Anthony Gill MRN: [id number] DOB: 1970-08-02  Referred By: Joshua Debby CROME, MD Reason for referral: Call Attempt #1 and Complex Care Management (Outreach to schedule referral with pharmacist )   [name] is a 53 y.o. year old male who is a primary care patient of Joshua Debby CROME, MD.  [name] was referred to the pharmacist for assistance related to: DMII  A third unsuccessful telephone outreach was attempted today to contact the patient who was referred to the pharmacy team for assistance with medication management. The Population Health team is pleased to engage with this patient at any time in the future upon receipt of referral and should he/she be interested in assistance from the Population Health team.  [name], CMA [facility]  [facility], [facility] Guide Direct Dial: [phone]  Fax: [phone] Website: [email or web address]

## 2023-09-25 ENCOUNTER — Encounter: Admitting: Physical Medicine and Rehabilitation

## 2023-09-25 NOTE — Telephone Encounter (Signed)
 Has the order for the Jardiance been sent to the pharmacy? I see the Farxiga  order on his chart.

## 2023-09-26 ENCOUNTER — Other Ambulatory Visit: Payer: Self-pay | Admitting: Internal Medicine

## 2023-09-26 DIAGNOSIS — E11618 Type 2 diabetes mellitus with other diabetic arthropathy: Secondary | ICD-10-CM

## 2023-09-26 MED ORDER — EMPAGLIFLOZIN 10 MG PO TABS: 10.0000 mg | ORAL_TABLET | Freq: Every day | ORAL | 0 refills | Status: AC

## 2023-09-26 NOTE — Telephone Encounter (Signed)
 Can you change his medication to Jardiance in which his insurance will cover ?

## 2023-09-30 NOTE — Telephone Encounter (Signed)
 Prior authorization not submitted for the Farxiga  due to Jardiance  is the preferred medication and the provider has changed it.

## 2023-10-29 ENCOUNTER — Ambulatory Visit: Admitting: Physical Medicine and Rehabilitation

## 2023-12-06 ENCOUNTER — Other Ambulatory Visit: Payer: Self-pay | Admitting: Internal Medicine

## 2023-12-06 DIAGNOSIS — K219 Gastro-esophageal reflux disease without esophagitis: Secondary | ICD-10-CM

## 2023-12-29 ENCOUNTER — Ambulatory Visit: Payer: Self-pay

## 2023-12-29 ENCOUNTER — Telehealth: Payer: Self-pay

## 2023-12-29 ENCOUNTER — Encounter: Payer: Self-pay | Admitting: Family Medicine

## 2023-12-29 ENCOUNTER — Ambulatory Visit: Admitting: Family Medicine

## 2023-12-29 VITALS — BP 124/88 | HR 90 | Temp 100.0°F | Ht 68.0 in | Wt 209.8 lb

## 2023-12-29 DIAGNOSIS — M1A09X0 Idiopathic chronic gout, multiple sites, without tophus (tophi): Secondary | ICD-10-CM

## 2023-12-29 DIAGNOSIS — M10041 Idiopathic gout, right hand: Secondary | ICD-10-CM

## 2023-12-29 MED ORDER — PREDNISONE 10 MG PO TABS: ORAL_TABLET | ORAL | 0 refills | Status: AC

## 2023-12-29 MED ORDER — ALLOPURINOL 300 MG PO TABS: 450.0000 mg | ORAL_TABLET | Freq: Every day | ORAL | 1 refills | Status: AC

## 2023-12-29 NOTE — Telephone Encounter (Signed)
 Copied from CRM [id number]. Topic: Clinical - Medication Question >> Dec 29, 2023  4:18 PM Ashley R wrote: Reason for CRM: Not prescribed anything for pain during visit today. Would like something sent in additional in increase in prednisone . [place] aware and usually send with other RX

## 2023-12-29 NOTE — Progress Notes (Signed)
 Phone [phone] In person visit   Subjective:   Anthony Gill is a 53 y.o. year old very pleasant male patient who presents for/with See problem oriented charting Chief Complaint  Patient presents with   Hand Swelling   Gout    Right hand swelling and hx of gout. Swelling started Friday. Did take his last 3 prednisone  on Friday the helped. Woke again this morning with hand swelling. Pain 0 out 10 is a 9.    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 30 tablet 0   latanoprost (XALATAN) 0.005 % ophthalmic solution Place 1 drop into both eyes at bedtime.     latanoprost (XALATAN) 0.005 % ophthalmic solution Apply 1 drop to eye.     omega-3 acid ethyl esters (LOVAZA ) 1 g capsule Take 2 capsules (2 g total) by mouth 2 (two) times daily. 360 capsule 0   pantoprazole  (PROTONIX ) 40 MG tablet Take 1 tablet by mouth once daily 90 tablet 0   rosuvastatin  (CRESTOR ) 5 MG tablet Take 1 tablet (5 mg total) by mouth daily. 90 tablet 0   triamterene -hydrochlorothiazide  (DYAZIDE) 37.5-25 MG capsule TAKE 1 CAPSULE BY MOUTH ONCE DAILY . APPOINTMENT REQUIRED FOR FUTURE  REFILLS 30 capsule 0   allopurinol  (ZYLOPRIM ) 300 MG tablet Take 1.5 tablets (450 mg total) by mouth daily. 135 tablet 1   empagliflozin  (JARDIANCE ) 10 MG TABS tablet Take 1 tablet (10 mg total) by mouth daily before breakfast. (Patient not taking: Reported on 12/29/2023) 90 tablet 0   indomethacin  (INDOCIN ) 50 MG capsule Take 1 capsule (50 mg total) by mouth 3 (three) times daily as needed. For gout (Patient not taking: Reported on 12/29/2023) 90 capsule 3   predniSONE  (DELTASONE ) 10 MG tablet 3 tabs by mouth per day for 3 days,2tabs per day for 3 days,1tab per day for 3 days 18 tablet 0   No current facility-administered medications for this visit.     Objective:  BP 124/88 (BP Location: Right Arm, Patient Position: Sitting, Cuff Size: Normal)   Pulse 90   Temp 100 F (37.8 C) (Tympanic)   Ht 5' 8 (1.727 m)   Wt 209 lb 12.8 oz (95.2 kg)   SpO2 98%   BMI 31.90 kg/m  Gen: NAD, resting comfortably CV: RRR no murmurs rubs or gallops Lungs: CTAB no crackles, wheeze, rhonchi Ext: no edema Right third MCP swollen, red, extremely tender to touch as well as warm    Assessment and Plan   # gout S: Patient with right hand swelling and history of gout.  Swelling started on Friday along with pain and redness.  He took prednisone  on Friday 30 mg (3 of 10 mg tablets)and found this beneficial but woke up again this morning with worsening  hand swelling.  Pain up to 9 out of 10. Pain at 3rd MCP.  No fall or injury reported  He takes allopurinol  300 mg daily Lab Results  Component Value Date   LABURIC 8.3 (H) 09/08/2023  - hives with colchicine  -indomethacin  sick on stomach  A/P: Gout flare of right third MCP joint.  Unfortunately allergy to colchicine  and intolerant of indomethacin .  His diabetes has been poorly controlled with last A1c at 8 he is due for follow-up but with severity of this flare I feel like we must move forward with prednisone  encourage close follow-up with PCP. -I am  increasing his allopurinol  to 450 mg based on last uric acid level at 8.3 with a 300 mg dose.  We discussed hydrochlorothiazide  may increase his risk of gout flares on 1 hand but also appears he needs this for blood pressure control so to follow-up with PCP for this.  He reports being on losartan  in the past which could lower risk on 1 hand but on the other hand apparently did not control his blood pressure well -Given improvement on prednisone  suspect this is gout-less likely cellulitis or abscess but if not improving to follow-up closely  Recommended follow up: Return for as needed for new, worsening, persistent symptoms.  Pleasant encouraged him to schedule close follow-up with PCP   Lab/Order associations:   ICD-10-CM   1. Acute idiopathic gout of right hand  M10.041     2. Idiopathic chronic gout of multiple sites without tophus  M1A.09X0 allopurinol  (ZYLOPRIM ) 300 MG tablet      Meds ordered this encounter  Medications   predniSONE  (DELTASONE ) 10 MG tablet    Sig: 3 tabs by mouth per day for 3 days,2tabs per day for 3 days,1tab per day for 3 days    Dispense:  18 tablet    Refill:  0   allopurinol  (ZYLOPRIM ) 300 MG tablet    Sig: Take 1.5 tablets (450 mg total) by mouth daily.    Dispense:  135 tablet    Refill:  1    Return precautions advised.  Garnette Lukes, MD

## 2023-12-29 NOTE — Patient Instructions (Addendum)
 Please make follow-up appointment with PCP DrRONITA Molt both for diabetes but also due to the gout- I think your uric acid needs to be below 6 in the long run to help prevent these flares  I don't love using prednisone  with a1c at 8 but we do not have a lot of other options with your allergy to colchicine  and upset stomach on indomethacin   We did increase allopurinol  to 1.5 pills or 450 mg to try to reduce recurrences- try to get visit with Dr. Molt within 4-6 weeks at the latest and he can likely recheck your levels  If not improving within a few days- get back in to see us  or Dr. Molt

## 2023-12-29 NOTE — Telephone Encounter (Signed)
 FYI Only or Action Required?: FYI only for provider.  Patient was last seen in primary care on 09/23/2023 by Norleen Lynwood ORN, MD.  Called Nurse Triage reporting Gout.  Symptoms began several days ago.  Interventions attempted: Prescription medications: previous prednisone  rx.  Symptoms are: gradually worsening.  Triage Disposition: See PCP When Office is Open (Within 3 Days)  Patient/caregiver understands and will follow disposition?: Yes    Copied from CRM [id number]. Topic: Clinical - Red Word Triage >> Dec 29, 2023 10:40 AM Berneda FALCON wrote: Red Word that prompted transfer to Nurse Triage: Patient states that he has swelling in his right hand due to gout as of last night. Reason for Disposition  [1] MODERATE pain (e.g., interferes with normal activities) AND [2] present > 3 days  Answer Assessment - Initial Assessment Questions Additional info; Allopurinal    1. ONSET: When did the pain start?     Friday used previous prednisone (3 pills) which helped but flared again last night 2. LOCATION: Where is the pain located?     Right hand all fingers to wrist  3. PAIN: How bad is the pain? (Scale 1-10; or mild, moderate, severe)     7/10 4. WORK OR EXERCISE: Has there been any recent work or exercise that involved this part (i.e., hand or wrist) of the body?      5. CAUSE: What do you think is causing the pain?     gout 6. AGGRAVATING FACTORS: What makes the pain worse? (e.g., using computer)     movement 7. OTHER SYMPTOMS: Do you have any other symptoms? (e.g., fever, neck pain, numbness or tingling, rash, swelling)     denies  Protocols used: Hand Pain-A-AH

## 2023-12-30 ENCOUNTER — Telehealth: Admitting: Family Medicine

## 2023-12-30 ENCOUNTER — Encounter: Payer: Self-pay | Admitting: Family Medicine

## 2023-12-30 ENCOUNTER — Ambulatory Visit: Payer: Self-pay | Admitting: *Deleted

## 2023-12-30 DIAGNOSIS — E1165 Type 2 diabetes mellitus with hyperglycemia: Secondary | ICD-10-CM

## 2023-12-30 DIAGNOSIS — E790 Hyperuricemia without signs of inflammatory arthritis and tophaceous disease: Secondary | ICD-10-CM

## 2023-12-30 DIAGNOSIS — M109 Gout, unspecified: Secondary | ICD-10-CM

## 2023-12-30 MED ORDER — HYDROCODONE-ACETAMINOPHEN 5-325 MG PO TABS: 1.0000 | ORAL_TABLET | Freq: Three times a day (TID) | ORAL | 0 refills | Status: AC | PRN

## 2023-12-30 NOTE — Telephone Encounter (Signed)
 Patient requesting call back if he needs to keep VV today or if PCP can order hydrocodone  as in past for gout flare up. See last OV notes from yesterday at [facility]. Recommended if sx worsens and can not wait until VV go to ED.     FYI Only or Action Required?: Action required by provider: medication refill request, update on patient condition, and requesting Rx for hydrocodone .  Patient was last seen in primary care on 12/29/2023 by Katrinka Garnette KIDD, MD.  Called Nurse Triage reporting Pain.  Symptoms began yesterday.  Interventions attempted: Prescription medications: prednisone  and allopurinol .  Symptoms are: rapidly worsening.  Triage Disposition: See HCP Within 4 Hours (Or PCP Triage)  Patient/caregiver understands and will follow disposition?: Yes          Copied from CRM [id number]. Topic: Clinical - Red Word Triage >> Dec 30, 2023 11:28 AM Adelita E wrote: [facility] that prompted transfer to Nurse Triage: Gout flare up in right hand worsening from triage yesterday, pain medication has not been sent in. Reason for Disposition  [1] SEVERE pain (e.g., excruciating, unable to use hand at all) AND [2] not improved after 2 hours of pain medicine  Answer Assessment - Initial Assessment Questions Patient requesting pain medication, hydrocodone  as used in the past for gout pain, due to worsening pain right hand. Last OV at [facility] yesterday and provider did not prescribe any pain medication. Patient requesting if PCP will order medication due to hx of gout flare ups. Recommended VV with provider to review sx and request pain medication. Patient was agreeable but requesting if PCP will refill/ reorder instead of paying for another OV/VV. Last hydrocodone  ordered 09/19/23. Patient requesting call back if he needs to keep VV appt scheduled for today or if PCP will order. Please advise.     1. ONSET: When did the pain start?     Friday  2. LOCATION:  Where is the pain located?     Right hand  3. PAIN: How bad is the pain? (Scale 1-10; or mild, moderate, severe)     severe 4. WORK OR EXERCISE: Has there been any recent work or exercise that involved this part (i.e., hand or wrist) of the body?     Na  5. CAUSE: What do you think is causing the pain?     Gout flare up  6. AGGRAVATING FACTORS: What makes the pain worse? (e.g., using computer)     Pain worsening without doing anything. 7. OTHER SYMPTOMS: Do you have any other symptoms? (e.g., fever, neck pain, numbness or tingling, rash, swelling)     Right hand pain worsening today difficulty using hand. Swelling and redness and severe pain, skin warm to touch reported. Denies fever.   8. PREGNANCY: Is there any chance you are pregnant? When was your last menstrual period?     na  Protocols used: Hand Pain-A-AH

## 2023-12-30 NOTE — Progress Notes (Signed)
 MyChart Video Visit    Virtual Visit via Video Note    Patient location: Home. Patient and provider in visit Provider location: Office 2 patient identifiers used  I discussed the limitations of evaluation and management by telemedicine and the availability of in person appointments. The patient expressed understanding and agreed to proceed.  Visit Date: 12/30/2023  Today's healthcare provider: Boby Mackintosh, NP-C     Subjective:    Patient ID: Anthony Gill, male    DOB: 16-Nov-1970, 53 y.o.   MRN: [id number]  Chief Complaint  Patient presents with   Hand Pain    Hand Pain  Pertinent negatives include no chest pain.     Discussed the use of AI scribe software for clinical note transcription with the patient, who gave verbal consent to proceed.  History of Present Illness Anthony Gill is a 53 year old male with gout who presents with severe hand pain and swelling.  Hand pain and swelling - Severe pain and swelling localized between the first and second fingers at the 2nd MCP joint.  - Symptoms began suddenly during the night prior to presentation - Pain is similar to previous gout episodes but in a different location - Symptoms have worsened since onset yesterday - No associated fever, chills, or open skin areas - No erythema of the palm  Medication management and adverse reactions - Currently taking allopurinol , recently increased from 100 mg to 300 mg due to elevated uric acid levels in July and then increased to 450 mg yesterday during visit with Dr. Lorren.  - Unable to take colchicine  due to hives - Unable to take indomethacin  due to gastrointestinal upset - Currently taking prednisone , started with three tablets last night and three this morning - Acetaminophen  (Tylenol ) has been ineffective for pain relief  Functional impact and self-care measures - Concerned about missing work in [place] due to symptoms      Past Medical History:   Diagnosis Date   Chicken pox    Epigastric pain 04/05/2014   Essential hypertension 05/02/2016   Gout    Headache disorder 2015   Tension w/medication overuse component suspected by neurologist (Dr. Skeet).  MRI brain ordered.   Migraines    PUD (peptic ulcer disease) 03/14/2014    Past Surgical History:  Procedure Laterality Date   COLONOSCOPY N/A 04/10/2014   Procedure: COLONOSCOPY;  Surgeon: Norleen JAYSON Hint, MD;  Location: [facility] ENDOSCOPY;  Service: Endoscopy;  Laterality: N/A;   ESOPHAGOGASTRODUODENOSCOPY N/A 04/10/2014   Procedure: ESOPHAGOGASTRODUODENOSCOPY (EGD);  Surgeon: Norleen JAYSON Hint, MD;  Location: [facility] ENDOSCOPY;  Service: Endoscopy;  Laterality: N/A;   HERNIA REPAIR  2006   WISDOM TOOTH EXTRACTION      Family History  Problem Relation Age of Onset   Diabetes Mother    Hyperlipidemia Mother    Prostate cancer Father        family history   Heart disease Father    Heart attack Father 37       Deceased   Hyperlipidemia Father    Diabetes Sister        x3   Hyperlipidemia Sister    Diabetes Brother        x1   Hyperlipidemia Brother        x1   Heart disease Paternal Uncle    Heart attack Paternal Uncle    Hypertension Other        family history   Cancer Other  Aunts & Uncles   Cancer Other        Grandparents    Social History   Socioeconomic History   Marital status: Single    Spouse name: Not on file   Number of children: Not on file   Years of education: Not on file   Highest education level: 12th grade  Occupational History   Not on file  Tobacco Use   Smoking status: Every Day    Current packs/day: 0.00    Types: Cigarettes    Start date: 03/05/2011    Last attempt to quit: 03/04/2014    Years since quitting: 9.8    Passive exposure: Current   Smokeless tobacco: Never  Vaping Use   Vaping status: Never Used  Substance and Sexual Activity   Alcohol use: Yes    Alcohol/week: 3.0 standard drinks of alcohol    Types: 3 Cans of beer per week     Comment: social   Drug use: No    Comment: former user   Sexual activity: Yes    Partners: Female    Birth control/protection: None    Comment: married  Other Topics Concern   Not on file  Social History Narrative   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   Grew up in [place]   Married   1 son   Social Drivers of Health   Financial Resource Strain: Medium Risk (09/08/2023)   Overall Financial Resource Strain (CARDIA)    Difficulty of Paying Living Expenses: Somewhat hard  Food Insecurity: No Food Insecurity (09/08/2023)   Hunger Vital Sign    Worried About Running Out of Food in the Last Year: Never true    Ran Out of Food in the Last Year: Never true  Transportation Needs: No Transportation Needs (09/08/2023)   PRAPARE - Administrator, Civil Service (Medical): No    Lack of Transportation (Non-Medical): No  Physical Activity: Unknown (09/08/2023)   Exercise Vital Sign    Days of Exercise per Week: 7 days    Minutes of Exercise per Session: Not on file  Stress: No Stress Concern Present (09/08/2023)   [employer] of Occupational Health - Occupational Stress Questionnaire    Feeling of Stress: Only a little  Social Connections: Moderately Integrated (09/08/2023)   Social Connection and Isolation Panel    Frequency of Communication with Friends and Family: More than three times a week    Frequency of Social Gatherings with Friends and Family: Twice a week    Attends Religious Services: More than 4 times per year    Active Member of [employer] or Organizations: Yes    Attends Engineer, Structural: More than 4 times per year    Marital Status: Divorced  Catering Manager Violence: Not on file    Outpatient Medications Prior to Visit  Medication Sig Dispense Refill   predniSONE  (DELTASONE ) 10 MG tablet 3 tabs by mouth per day for 3 days,2tabs per day for 3 days,1tab per day for 3 days 18 tablet 0   allopurinol  (ZYLOPRIM ) 300 MG tablet Take 1.5 tablets  (450 mg total) by mouth daily. 135 tablet 1   empagliflozin  (JARDIANCE ) 10 MG TABS tablet Take 1 tablet (10 mg total) by mouth daily before breakfast. (Patient not taking: Reported on 12/30/2023) 90 tablet 0   latanoprost (XALATAN) 0.005 % ophthalmic solution Place 1 drop into both eyes at bedtime.     latanoprost (XALATAN) 0.005 % ophthalmic solution Apply 1  drop to eye.     omega-3 acid ethyl esters (LOVAZA ) 1 g capsule Take 2 capsules (2 g total) by mouth 2 (two) times daily. 360 capsule 0   pantoprazole  (PROTONIX ) 40 MG tablet Take 1 tablet by mouth once daily 90 tablet 0   rosuvastatin  (CRESTOR ) 5 MG tablet Take 1 tablet (5 mg total) by mouth daily. 90 tablet 0   triamterene -hydrochlorothiazide  (DYAZIDE) 37.5-25 MG capsule TAKE 1 CAPSULE BY MOUTH ONCE DAILY . APPOINTMENT REQUIRED FOR FUTURE REFILLS 30 capsule 0   HYDROcodone -acetaminophen  (NORCO/VICODIN) 5-325 MG tablet Take 1 tablet by mouth every 6 (six) hours as needed. 30 tablet 0   indomethacin  (INDOCIN ) 50 MG capsule Take 1 capsule (50 mg total) by mouth 3 (three) times daily as needed. For gout (Patient not taking: Reported on 12/30/2023) 90 capsule 3   No facility-administered medications prior to visit.    Allergies  Allergen Reactions   Ciprofloxacin  Itching   Colchicine      hives   Doxycycline      Stomach upset   Indapamide      Gout, cramps   Metformin  And Related     Review of Systems  Constitutional:  Negative for chills and fever.  Respiratory:  Negative for cough and shortness of breath.   Cardiovascular:  Negative for chest pain, palpitations and leg swelling.  Gastrointestinal:  Negative for diarrhea, nausea and vomiting.  Musculoskeletal:  Positive for joint pain.  Skin:  Negative for itching and rash.  Neurological:  Negative for dizziness, focal weakness and headaches.       Objective:    Physical Exam Constitutional:      General: He is not in acute distress.    Appearance: He is not ill-appearing.   Eyes:     Extraocular Movements: Extraocular movements intact.     Conjunctiva/sclera: Conjunctivae normal.  Pulmonary:     Effort: Pulmonary effort is normal.  Musculoskeletal:     Right wrist: Normal.     Cervical back: Normal range of motion and neck supple.  Skin:    General: Skin is dry.  Neurological:     General: No focal deficit present.     Mental Status: He is alert and oriented to person, place, and time.  Psychiatric:        Mood and Affect: Mood normal.        Behavior: Behavior normal.        Thought Content: Thought content normal.     There were no vitals taken for this visit. Wt Readings from Last 3 Encounters:  12/29/23 209 lb 12.8 oz (95.2 kg)  09/08/23 212 lb (96.2 kg)  01/09/23 198 lb (89.8 kg)       Assessment & Plan:   Problem List Items Addressed This Visit     Diabetes mellitus (HCC)   Other Visit Diagnoses       Acute gout of right hand, unspecified cause    -  Primary   Relevant Medications   HYDROcodone -acetaminophen  (NORCO/VICODIN) 5-325 MG tablet     Abnormal blood level of uric acid           Assessment and Plan Assessment & Plan Gout flare, right hand index finger joint with hyperuricemia Acute gout flare in the right hand index and 2nd finger joints with associated swelling and redness. Hyperuricemia likely contributing to the flare. Differential diagnosis includes infection, but no fever, chills, or open skin lesions present. Current treatment includes allopurinol  300 mg and prednisone . Concerns about potential infection  due to increased swelling and redness. Prednisone  may raise blood sugar levels, necessitating monitoring. - Continue allopurinol  300 mg daily. - Continue prednisone  taper as prescribed. - Prescribed hydrocodone  with acetaminophen  for pain management, with caution regarding driving and alcohol consumption. - PDMP reviewed  - Advised icing and elevating the affected hand. - Instructed to monitor for signs of  infection, such as redness on the palm, and seek immediate care if these occur. - Scheduled follow-up appointment with Doctor Joshua in 3-4 weeks for further evaluation and management.  Diabetes not well controlled.  - Advised steroids can increase blood sugar and to monitor   Adverse reactions to gout medications (colchicine  and indomethacin ) Adverse reactions to colchicine  (hives) and indomethacin  (gastrointestinal upset). Current management with allopurinol  and prednisone  is ongoing. - Continue to avoid colchicine  and indomethacin  due to adverse reactions.     I have discontinued Fairy PARAS. Laino's indomethacin . I have also changed his HYDROcodone -acetaminophen . Additionally, I am having him maintain his latanoprost, omega-3 acid ethyl esters, rosuvastatin , triamterene -hydrochlorothiazide , empagliflozin , pantoprazole , latanoprost, predniSONE , and allopurinol .  Meds ordered this encounter  Medications   HYDROcodone -acetaminophen  (NORCO/VICODIN) 5-325 MG tablet    Sig: Take 1-2 tablets by mouth every 8 (eight) hours as needed for up to 3 days.    Dispense:  18 tablet    Refill:  0    Supervising Provider:   ROLLENE NORRIS A [[id number]]    I discussed the assessment and treatment plan with the patient. The patient was provided an opportunity to ask questions and all were answered. The patient agreed with the plan and demonstrated an understanding of the instructions.   The patient was advised to call back or seek an in-person evaluation if the symptoms worsen or if the condition fails to improve as anticipated.    Boby Mackintosh, NP-C [facility] [place] [phone] (phone) [phone] (fax)  [facility]

## 2023-12-30 NOTE — Progress Notes (Deleted)
 Subjective:     Patient ID: Anthony Gill, male    DOB: 1970-10-22, 53 y.o.   MRN: [id number]  Chief Complaint  Patient presents with   Hand Pain    HPI  Discussed the use of AI scribe software for clinical note transcription with the patient, who gave verbal consent to proceed.  History of Present Illness      Health Maintenance Due  Topic Date Due   Diabetic kidney evaluation - Urine ACR  Never done   Pneumococcal Vaccine: 50+ Years (1 of 2 - PCV) Never done   Zoster Vaccines- Shingrix  (1 of 2) Never done   Influenza Vaccine  10/03/2023   FOOT EXAM  10/15/2023   COVID-19 Vaccine (3 - 2025-26 season) 11/03/2023   Colonoscopy  04/10/2024    Past Medical History:  Diagnosis Date   Chicken pox    Epigastric pain 04/05/2014   Essential hypertension 05/02/2016   Gout    Headache disorder 2015   Tension w/medication overuse component suspected by neurologist (Dr. Skeet).  MRI brain ordered.   Migraines    PUD (peptic ulcer disease) 03/14/2014    Past Surgical History:  Procedure Laterality Date   COLONOSCOPY N/A 04/10/2014   Procedure: COLONOSCOPY;  Surgeon: Norleen JAYSON Hint, MD;  Location: [facility] ENDOSCOPY;  Service: Endoscopy;  Laterality: N/A;   ESOPHAGOGASTRODUODENOSCOPY N/A 04/10/2014   Procedure: ESOPHAGOGASTRODUODENOSCOPY (EGD);  Surgeon: Norleen JAYSON Hint, MD;  Location[facility] END[facility]COPY;  Service: Endoscopy;  Laterality: N/A;   HERNIA REPAIR  2006   WISDOM TOOTH EXTRACTION      Family History  Problem Relation Age of Onset   Diabetes Mother    Hyperlipidemia Mother    Prostate cancer Father        family history   Heart disease Father    Heart attack Father 66       Deceased   Hyperlipidemia Father    Diabetes Sister        x3   Hyperlipidemia Sister    Diabetes Brother        x1   Hyperlipidemia Brother        x1   Heart disease Paternal Uncle    Heart attack Paternal Uncle    Hypertension Other        family history   Cancer Other        Aunts & Uncles    Cancer Other        Grandparents    Social History   Socioeconomic History   Marital status: Single    Spouse name: Not on file   Number of children: Not on file   Years of education: Not on file   Highest education level: 12th grade  Occupational History   Not on file  Tobacco Use   Smoking status: Every Day    Current packs/day: 0.00    Types: Cigarettes    Start date: 03/05/2011    Last attempt to quit: 03/04/2014    Years since quitting: 9.8    Passive exposure: Current   Smokeless tobacco: Never  Vaping Use   Vaping status: Never Used  Substance and Sexual Activity   Alcohol use: Yes    Alcohol/week: 3.0 standard drinks of alcohol    Types: 3 Cans of beer per week    Comment: social   Drug use: No    Comment: former user   Sexual activity: Yes    Partners: Female    Birth control/protection: None  Comment: married  Other Topics Concern   Not on file  Social History Narrative   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   Grew up in [place]   Married   1 son   Social Drivers of Health   Financial Resource Strain: Medium Risk (09/08/2023)   Overall Financial Resource Strain (CARDIA)    Difficulty of Paying Living Expenses: Somewhat hard  Food Insecurity: No Food Insecurity (09/08/2023)   Hunger Vital Sign    Worried About Running Out of Food in the Last Year: Never true    Ran Out of Food in the Last Year: Never true  Transportation Needs: No Transportation Needs (09/08/2023)   PRAPARE - Administrator, Civil Service (Medical): No    Lack of Transportation (Non-Medical): No  Physical Activity: Unknown (09/08/2023)   Exercise Vital Sign    Days of Exercise per Week: 7 days    Minutes of Exercise per Session: Not on file  Stress: No Stress Concern Present (09/08/2023)   [employer] of Occupational Health - Occupational Stress Questionnaire    Feeling of Stress: Only a little  Social Connections: Moderately Integrated (09/08/2023)   Social  Connection and Isolation Panel    Frequency of Communication with Friends and Family: More than three times a week    Frequency of Social Gatherings with Friends and Family: Twice a week    Attends Religious Services: More than 4 times per year    Active Member of [employer] or Organizations: Yes    Attends Engineer, Structural: More than 4 times per year    Marital Status: Divorced  Catering Manager Violence: Not on file    Outpatient Medications Prior to Visit  Medication Sig Dispense Refill   predniSONE  (DELTASONE ) 10 MG tablet 3 tabs by mouth per day for 3 days,2tabs per day for 3 days,1tab per day for 3 days 18 tablet 0   allopurinol  (ZYLOPRIM ) 300 MG tablet Take 1.5 tablets (450 mg total) by mouth daily. 135 tablet 1   empagliflozin  (JARDIANCE ) 10 MG TABS tablet Take 1 tablet (10 mg total) by mouth daily before breakfast. (Patient not taking: Reported on 12/30/2023) 90 tablet 0   HYDROcodone -acetaminophen  (NORCO/VICODIN) 5-325 MG tablet Take 1 tablet by mouth every 6 (six) hours as needed. 30 tablet 0   indomethacin  (INDOCIN ) 50 MG capsule Take 1 capsule (50 mg total) by mouth 3 (three) times daily as needed. For gout (Patient not taking: Reported on 12/30/2023) 90 capsule 3   latanoprost (XALATAN) 0.005 % ophthalmic solution Place 1 drop into both eyes at bedtime.     latanoprost (XALATAN) 0.005 % ophthalmic solution Apply 1 drop to eye.     omega-3 acid ethyl esters (LOVAZA ) 1 g capsule Take 2 capsules (2 g total) by mouth 2 (two) times daily. 360 capsule 0   pantoprazole  (PROTONIX ) 40 MG tablet Take 1 tablet by mouth once daily 90 tablet 0   rosuvastatin  (CRESTOR ) 5 MG tablet Take 1 tablet (5 mg total) by mouth daily. 90 tablet 0   triamterene -hydrochlorothiazide  (DYAZIDE) 37.5-25 MG capsule TAKE 1 CAPSULE BY MOUTH ONCE DAILY . APPOINTMENT REQUIRED FOR FUTURE REFILLS 30 capsule 0   No facility-administered medications prior to visit.    Allergies  Allergen Reactions    Ciprofloxacin  Itching   Colchicine      hives   Doxycycline      Stomach upset   Indapamide      Gout, cramps   Metformin  And Related  ROS     Objective:    Physical Exam   There were no vitals taken for this visit. Wt Readings from Last 3 Encounters:  12/29/23 209 lb 12.8 oz (95.2 kg)  09/08/23 212 lb (96.2 kg)  01/09/23 198 lb (89.8 kg)       Assessment & Plan:   Problem List Items Addressed This Visit   None   Assessment and Plan Assessment & Plan      I am having Fairy DOROTHA Slocumb maintain his latanoprost, omega-3 acid ethyl esters, rosuvastatin , HYDROcodone -acetaminophen , indomethacin , triamterene -hydrochlorothiazide , empagliflozin , pantoprazole , latanoprost, predniSONE , and allopurinol .  No orders of the defined types were placed in this encounter.

## 2024-01-02 NOTE — Telephone Encounter (Signed)
 Pain medication was sent in

## 2024-01-05 ENCOUNTER — Encounter: Payer: Self-pay | Admitting: Radiology

## 2024-01-06 ENCOUNTER — Other Ambulatory Visit: Payer: Self-pay

## 2024-01-06 ENCOUNTER — Encounter (HOSPITAL_COMMUNITY): Payer: Self-pay

## 2024-01-06 ENCOUNTER — Ambulatory Visit: Payer: Self-pay

## 2024-01-06 ENCOUNTER — Emergency Department (HOSPITAL_COMMUNITY)

## 2024-01-06 ENCOUNTER — Emergency Department (HOSPITAL_COMMUNITY)
Admission: EM | Admit: 2024-01-06 | Discharge: 2024-01-06 | Disposition: A | Discharge status: Home / Self Care | Attending: Emergency Medicine | Admitting: Emergency Medicine

## 2024-01-06 DIAGNOSIS — R10A2 Flank pain, left side: Secondary | ICD-10-CM | POA: Insufficient documentation

## 2024-01-06 DIAGNOSIS — R059 Cough, unspecified: Secondary | ICD-10-CM | POA: Insufficient documentation

## 2024-01-06 DIAGNOSIS — R0789 Other chest pain: Secondary | ICD-10-CM | POA: Insufficient documentation

## 2024-01-06 LAB — CBC WITH DIFFERENTIAL/PLATELET
Abs Immature Granulocytes: 0.03 K/uL (ref 0.00–0.07)
Basophils Absolute: 0 K/uL (ref 0.0–0.1)
Basophils Relative: 0 %
Eosinophils Absolute: 0 K/uL (ref 0.0–0.5)
Eosinophils Relative: 0 %
HCT: 40.7 % (ref 39.0–52.0)
Hemoglobin: 14.5 g/dL (ref 13.0–17.0)
Immature Granulocytes: 0 %
Lymphocytes Relative: 23 %
Lymphs Abs: 1.6 K/uL (ref 0.7–4.0)
MCH: 29.7 pg (ref 26.0–34.0)
MCHC: 35.6 g/dL (ref 30.0–36.0)
MCV: 83.4 fL (ref 80.0–100.0)
Monocytes Absolute: 0.5 K/uL (ref 0.1–1.0)
Monocytes Relative: 7 %
Neutro Abs: 4.8 K/uL (ref 1.7–7.7)
Neutrophils Relative %: 70 %
Platelets: 267 K/uL (ref 150–400)
RBC: 4.88 MIL/uL (ref 4.22–5.81)
RDW: 12.7 % (ref 11.5–15.5)
WBC: 6.9 K/uL (ref 4.0–10.5)
nRBC: 0 % (ref 0.0–0.2)

## 2024-01-06 LAB — URINALYSIS, ROUTINE W REFLEX MICROSCOPIC
Bacteria, UA: NONE SEEN
Bilirubin Urine: NEGATIVE
Glucose, UA: NEGATIVE mg/dL
Hgb urine dipstick: NEGATIVE
Ketones, ur: NEGATIVE mg/dL
Leukocytes,Ua: NEGATIVE
Nitrite: NEGATIVE
Protein, ur: 100 mg/dL — AB
Specific Gravity, Urine: 1.023 (ref 1.005–1.030)
pH: 5 (ref 5.0–8.0)

## 2024-01-06 LAB — COMPREHENSIVE METABOLIC PANEL WITH GFR
ALT: 26 U/L (ref 0–44)
AST: 29 U/L (ref 15–41)
Albumin: 4.5 g/dL (ref 3.5–5.0)
Alkaline Phosphatase: 79 U/L (ref 38–126)
Anion gap: 13 (ref 5–15)
BUN: 10 mg/dL (ref 6–20)
CO2: 28 mmol/L (ref 22–32)
Calcium: 9.9 mg/dL (ref 8.9–10.3)
Chloride: 95 mmol/L — ABNORMAL LOW (ref 98–111)
Creatinine, Ser: 1.32 mg/dL — ABNORMAL HIGH (ref 0.61–1.24)
GFR, Estimated: >60 mL/min (ref >60)
Glucose, Bld: 204 mg/dL — ABNORMAL HIGH (ref 70–99)
Potassium: 4 mmol/L (ref 3.5–5.1)
Sodium: 136 mmol/L (ref 135–145)
Total Bilirubin: 0.7 mg/dL (ref 0.0–1.2)
Total Protein: 8.1 g/dL (ref 6.5–8.1)

## 2024-01-06 MED ORDER — OXYCODONE-ACETAMINOPHEN 5-325 MG PO TABS
2.0000 | ORAL_TABLET | Freq: Once | ORAL | Status: AC
  Administered 2024-01-06: 2 via ORAL
  Filled 2024-01-06: qty 2

## 2024-01-06 MED ORDER — KETOROLAC TROMETHAMINE 30 MG/ML IJ SOLN: 30.0000 mg | Freq: Once | INTRAMUSCULAR | Status: DC

## 2024-01-06 MED ORDER — METHOCARBAMOL 500 MG PO TABS: 500.0000 mg | ORAL_TABLET | Freq: Two times a day (BID) | ORAL | 0 refills | Status: AC

## 2024-01-06 MED ORDER — NAPROXEN 375 MG PO TABS: 375.0000 mg | ORAL_TABLET | Freq: Two times a day (BID) | ORAL | 0 refills | Status: AC

## 2024-01-06 NOTE — ED Provider Notes (Signed)
 [place] [facility]GENC[facility] DEPARTMENT AT [facility] Provider Note   CSN: [id number] Arrival date & time: 01/06/24  1442     Patient presents with: Flank Pain   Anthony Gill is a 53 y.o. male.   Patient reports onset of left lower posterior chest wall pain associated with a cough on Friday. Pain returned today when patient coughed. No shortness of breath. No fever or chills. No abdominal pain, nausea, vomiting. No anterior chest discomfort. TTP at lower posterior rib margin.  The history is provided by the patient.  Flank Pain This is a new problem. The current episode started more than 2 days ago. Pertinent negatives include no abdominal pain and no shortness of breath. The symptoms are aggravated by coughing. He has tried acetaminophen  for the symptoms. The treatment provided mild relief.       Prior to Admission medications   Medication Sig Start Date End Date Taking? Authorizing Provider  allopurinol  (ZYLOPRIM ) 300 MG tablet Take 1.5 tablets (450 mg total) by mouth daily. 12/29/23   Katrinka Garnette KIDD, MD  empagliflozin  (JARDIANCE ) 10 MG TABS tablet Take 1 tablet (10 mg total) by mouth daily before breakfast. Patient not taking: Reported on 12/30/2023 09/26/23   Joshua Debby CROME, MD  latanoprost (XALATAN) 0.005 % ophthalmic solution Place 1 drop into both eyes at bedtime. 08/06/23   [provider]  latanoprost (XALATAN) 0.005 % ophthalmic solution Apply 1 drop to eye. 10/24/23   [provider]  omega-3 acid ethyl esters (LOVAZA ) 1 g capsule Take 2 capsules (2 g total) by mouth 2 (two) times daily. 09/09/23   Joshua Debby CROME, MD  pantoprazole  (PROTONIX ) 40 MG tablet Take 1 tablet by mouth once daily 12/09/23   Joshua Debby CROME, MD  predniSONE  (DELTASONE ) 10 MG tablet 3 tabs by mouth per day for 3 days,2tabs per day for 3 days,1tab per day for 3 days 12/29/23   Katrinka Garnette KIDD, MD  rosuvastatin  (CRESTOR ) 5 MG tablet Take 1 tablet (5 mg total) by mouth daily.  09/09/23   Joshua Debby CROME, MD  triamterene -hydrochlorothiazide  (DYAZIDE) 37.5-25 MG capsule TAKE 1 CAPSULE BY MOUTH ONCE DAILY . APPOINTMENT REQUIRED FOR FUTURE REFILLS 09/23/23   Joshua Debby CROME, MD    Allergies: Ciprofloxacin , Colchicine , Doxycycline , Indapamide , and Metformin  and related    Review of Systems  Constitutional:  Negative for fever.  Respiratory:  Positive for cough. Negative for chest tightness and shortness of breath.   Cardiovascular:  Negative for palpitations and leg swelling.  Gastrointestinal:  Negative for abdominal pain, nausea and vomiting.  Genitourinary:  Positive for flank pain.  All other systems reviewed and are negative.   Updated Vital Signs BP (!) 167/116   Pulse 86   Temp 98.4 F (36.9 C) (Oral)   Resp 18   SpO2 100%   Physical Exam Vitals and nursing note reviewed.  Constitutional:      Appearance: Normal appearance.  HENT:     Head: Normocephalic.  Eyes:     Conjunctiva/sclera: Conjunctivae normal.  Cardiovascular:     Rate and Rhythm: Normal rate and regular rhythm.  Pulmonary:     Effort: Pulmonary effort is normal.     Breath sounds: Normal breath sounds.  Abdominal:     Palpations: Abdomen is soft.  Musculoskeletal:        General: Normal range of motion.  Skin:    General: Skin is warm and dry.  Neurological:     Mental Status: He is alert  and oriented to person, place, and time.  Psychiatric:        Mood and Affect: Mood normal.        Behavior: Behavior normal.     (all labs ordered are listed, but only abnormal results are displayed) Labs Reviewed  COMPREHENSIVE METABOLIC PANEL WITH GFR - Abnormal; Notable for the following components:      Result Value   Chloride 95 (*)    Glucose, Bld 204 (*)    Creatinine, Ser 1.32 (*)    All other components within normal limits  URINALYSIS, ROUTINE W REFLEX MICROSCOPIC - Abnormal; Notable for the following components:   Color, Urine AMBER (*)    APPearance HAZY (*)     Protein, ur 100 (*)    All other components within normal limits  CBC WITH DIFFERENTIAL/PLATELET    EKG: None  Radiology: CT Renal Stone Study Result Date: 01/06/2024 CLINICAL DATA:  left flank pain EXAM: CT ABDOMEN AND PELVIS WITHOUT CONTRAST TECHNIQUE: Multidetector CT imaging of the abdomen and pelvis was performed following the standard protocol without IV contrast. RADIATION DOSE REDUCTION: This exam was performed according to the departmental dose-optimization program which includes automated exposure control, adjustment of the mA and/or kV according to patient size and/or use of iterative reconstruction technique. COMPARISON:  04/05/2014 FINDINGS: Of note, the lack of intravenous contrast limits evaluation of the solid organ parenchyma and vascularity. Lower chest: No focal airspace consolidation or pleural effusion. Scattered atelectasis in the lung bases. Hepatobiliary: No mass.Mild hepatic steatosis.The gallbladder is decompressed with multiple stones. No wall thickening. No intrahepatic or extrahepatic biliary ductal dilation. Pancreas: No mass or main ductal dilation. No peripancreatic inflammation or fluid collection. Spleen: Normal size. No mass. Adrenals/Urinary Tract: No adrenal masses. A couple of small cysts in the right kidney. No hydronephrosis or nephrolithiasis. Partially distended urinary bladder without visualized abnormality. Stomach/Bowel: The stomach is decompressed without focal abnormality. No small bowel wall thickening or inflammation. No small bowel obstruction.Normal appendix. Vascular/Lymphatic: No aortic aneurysm. Scattered aortoiliac atherosclerosis. No intraabdominal or pelvic lymphadenopathy. Reproductive: No prostatomegaly. No free pelvic fluid. Other: No pneumoperitoneum, ascites, or mesenteric inflammation. Musculoskeletal: No acute fracture or destructive lesion. Small fat containing right inguinal hernia. Multilevel lower lumbar facet arthropathy. Multilevel  thoracic osteophytosis. IMPRESSION: 1. No acute intra-abdominal or pelvic abnormality. 2. Mild hepatic steatosis. Aortic Atherosclerosis (ICD10-I70.0). Electronically Signed   By: Rogelia Myers M.D.   On: 01/06/2024 16:59     Procedures   Medications Ordered in the ED  oxyCODONE -acetaminophen  (PERCOCET/ROXICET) 5-325 MG per tablet 2 tablet (2 tablets Oral Given 01/06/24 1535)                                    Medical Decision Making  Patient with left posterior chest wall pain. Pain is localized with focal area of discomfort. No acute abnormalities on work-up. Doubt infectious process. Likely MSK in origin. Will treat with NSAID and muscle relaxant. Care instructions and return precaution provided. Patient appears safe for discharge.     Final diagnoses:  Left-sided chest wall pain    ED Discharge Orders          Ordered    naproxen  (NAPROSYN ) 375 MG tablet  2 times daily        01/06/24 2109    methocarbamol (ROBAXIN) 500 MG tablet  2 times daily        01/06/24 2109  Claudene Lenis, NP 01/06/24 2113    Patt Lenis Macho, MD 01/06/24 [phone]

## 2024-01-06 NOTE — ED Triage Notes (Signed)
 Pt endorses nonproductive cough since Friday; denies fevers; sob with exertion

## 2024-01-06 NOTE — Telephone Encounter (Signed)
 FYI Only or Action Required?: Action required by provider: Refused ED recommendation-wants to be seen in the office.  Patient was last seen in primary care on 12/30/2023 by Lendia Boby CROME, NP-C.  Called Nurse Triage reporting Flank Pain.  Symptoms began Friday.  Interventions attempted: Rest, hydration, or home remedies.  Symptoms are: gradually worsening.  Triage Disposition: Go to ED Now (Notify PCP)  Patient/caregiver understands and will follow disposition?: No, refuses disposition  Copied from CRM [id number]. Topic: Clinical - Red Word Triage >> Jan 06, 2024  9:57 AM Revonda D wrote: Red Word that prompted transfer to Nurse Triage: pain   Pt stated that he is experiencing pain on his left side and would like to schedule an appt with the provider.    ----------------------------------------------------------------------- From previous Reason for Contact - Scheduling: Patient/patient representative is calling to schedule an appointment. Refer to attachments for appointment information. Reason for Disposition  [1] SEVERE pain (e.g., excruciating, scale 8-10) AND [2] present > 1 hour  Answer Assessment - Initial Assessment Questions Patient reports left sided flank pain that goes into his abdomen. Endorses pain started this past Friday. Pain is a constant. Describes that the pain level is a 9 but when he takes a deep breath in or cough, pain will increase to a 12 per patient report. Patient is recommended to ED due to symptoms. Patient refused ED-wants to be seen in the office.   1. LOCATION: Where does it hurt? (e.g., left, right)     Left side 2. ONSET: When did the pain start?     Started Friday  3. SEVERITY: How bad is the pain? (e.g., Scale 1-10; mild, moderate, or severe)     9 out of 10 4. PATTERN: Does the pain come and go, or is it constant?      Constant 5. CAUSE: What do you think is causing the pain?     unsure 6. OTHER SYMPTOMS:  Do you have any other  symptoms? (e.g., fever, abdomen pain, vomiting, leg weakness, burning with urination, blood in urine)     Abdominal pain  Protocols used: Flank Pain-A-AH

## 2024-01-06 NOTE — Discharge Instructions (Addendum)
 Please refer to the attached instructions. Take medication as directed. Follow-up with your primary care provider if no improvement with medication.

## 2024-01-06 NOTE — ED Provider Triage Note (Signed)
 Emergency Medicine Provider Triage Evaluation Note  Anthony Gill , a 53 y.o. male  was evaluated in triage.  Pt complains of left flank pain since Friday.  Patient states that on Friday he experienced sharp left-sided flank pain with associated nausea.  Patient states that he took Tylenol  for same with minimal relief.  Patient stated that this morning he had another episode of left-sided flank pain that has not been relieved with OTC pain medication and his PCP told him to be seen at the emergency department.  Review of Systems  Positive: Left-sided flank pain Negative: Fevers, urinary symptoms such as frequency/burning/hematuria, shortness of breath, chest pain.  Physical Exam  BP (!) 145/108   Pulse (!) 107   Temp 98.6 F (37 C) (Oral)   Resp 14   SpO2 91%  Gen:   Awake, no distress   Resp:  Normal effort  MSK:   Moves extremities without difficulty  Other:  Left CVA tenderness on palpation   Medical Decision Making  Medically screening exam initiated at 3:14 PM.  Appropriate orders placed.  Anthony Gill was informed that the remainder of the evaluation will be completed by another provider, this initial triage assessment does not replace that evaluation, and the importance of remaining in the ED until their evaluation is complete.   Anthony Gill, Anthony Gill 01/06/24 [phone]

## 2024-01-06 NOTE — ED Triage Notes (Signed)
 Pt c.o left sided flank pain after coughing on Friday. Denies any recent injury. No urinary s/s

## 2024-01-06 NOTE — Telephone Encounter (Signed)
 FYI Only or Action Required?: Update on patient condition, patient agrees to be seen in ED now per previous encounter as symptoms persist.   Patient was last seen in primary care on 12/30/2023 by Lendia Nordmann L, NP-C.  Called Nurse Triage reporting Abdominal Pain.   Triage Disposition: Information or Advice Only Call  Patient/caregiver understands and will follow disposition?: Yes           Copied from CRM [id number]. Topic: Clinical - Red Word Triage >> Jan 06, 2024  1:48 PM Carrielelia G wrote: [facility] that prompted transfer to Nurse Triage:  left side pain, abdominal area and back. Reason for Disposition  Health information question, no triage required and triager able to answer question  Answer Assessment - Initial Assessment Questions 1. REASON FOR CALL: What is the main reason for your call? or How can I best help you?   Patient disagreed with Ed disposition earlier today when speaking to triage; patient called back in to state he will be going as his symptoms persist.  Protocols used: Information Only Call - No Triage-A-AH

## 2024-01-21 ENCOUNTER — Ambulatory Visit: Admitting: Internal Medicine

## 2024-01-30 ENCOUNTER — Other Ambulatory Visit: Payer: Self-pay | Admitting: Internal Medicine

## 2024-01-30 DIAGNOSIS — E785 Hyperlipidemia, unspecified: Secondary | ICD-10-CM

## 2024-01-30 DIAGNOSIS — Z8249 Family history of ischemic heart disease and other diseases of the circulatory system: Secondary | ICD-10-CM

## 2024-02-09 ENCOUNTER — Other Ambulatory Visit: Payer: Self-pay | Admitting: Internal Medicine

## 2024-02-09 DIAGNOSIS — I10 Essential (primary) hypertension: Secondary | ICD-10-CM

## 2024-03-31 ENCOUNTER — Ambulatory Visit: Admitting: Internal Medicine
